# Patient Record
Sex: Female | Born: 1950 | Race: White | Hispanic: No | Marital: Married | Smoking: Never smoker
Health system: Southern US, Community
[De-identification: ages and names within clinical notes are randomized; demographics above are authoritative.]

## PROBLEM LIST (undated history)

## (undated) DIAGNOSIS — Z8719 Personal history of other diseases of the digestive system: Secondary | ICD-10-CM

## (undated) DIAGNOSIS — T8859XA Other complications of anesthesia, initial encounter: Secondary | ICD-10-CM

## (undated) DIAGNOSIS — N393 Stress incontinence (female) (male): Secondary | ICD-10-CM

## (undated) DIAGNOSIS — I519 Heart disease, unspecified: Secondary | ICD-10-CM

## (undated) DIAGNOSIS — Z8709 Personal history of other diseases of the respiratory system: Secondary | ICD-10-CM

## (undated) DIAGNOSIS — M810 Age-related osteoporosis without current pathological fracture: Secondary | ICD-10-CM

## (undated) DIAGNOSIS — K219 Gastro-esophageal reflux disease without esophagitis: Secondary | ICD-10-CM

## (undated) DIAGNOSIS — Z9889 Other specified postprocedural states: Secondary | ICD-10-CM

## (undated) DIAGNOSIS — M1990 Unspecified osteoarthritis, unspecified site: Secondary | ICD-10-CM

## (undated) DIAGNOSIS — M720 Palmar fascial fibromatosis [Dupuytren]: Secondary | ICD-10-CM

## (undated) DIAGNOSIS — R911 Solitary pulmonary nodule: Secondary | ICD-10-CM

## (undated) DIAGNOSIS — I341 Nonrheumatic mitral (valve) prolapse: Secondary | ICD-10-CM

## (undated) DIAGNOSIS — Z9289 Personal history of other medical treatment: Secondary | ICD-10-CM

## (undated) DIAGNOSIS — Z973 Presence of spectacles and contact lenses: Secondary | ICD-10-CM

## (undated) DIAGNOSIS — I499 Cardiac arrhythmia, unspecified: Secondary | ICD-10-CM

## (undated) DIAGNOSIS — E785 Hyperlipidemia, unspecified: Secondary | ICD-10-CM

## (undated) DIAGNOSIS — C801 Malignant (primary) neoplasm, unspecified: Secondary | ICD-10-CM

## (undated) DIAGNOSIS — I951 Orthostatic hypotension: Secondary | ICD-10-CM

## (undated) DIAGNOSIS — J302 Other seasonal allergic rhinitis: Secondary | ICD-10-CM

## (undated) DIAGNOSIS — R011 Cardiac murmur, unspecified: Secondary | ICD-10-CM

## (undated) DIAGNOSIS — I700 Atherosclerosis of aorta: Secondary | ICD-10-CM

## (undated) DIAGNOSIS — K759 Inflammatory liver disease, unspecified: Secondary | ICD-10-CM

## (undated) DIAGNOSIS — R079 Chest pain, unspecified: Secondary | ICD-10-CM

## (undated) DIAGNOSIS — T4145XA Adverse effect of unspecified anesthetic, initial encounter: Secondary | ICD-10-CM

## (undated) DIAGNOSIS — I471 Supraventricular tachycardia, unspecified: Secondary | ICD-10-CM

## (undated) DIAGNOSIS — Z8701 Personal history of pneumonia (recurrent): Secondary | ICD-10-CM

## (undated) DIAGNOSIS — M2560 Stiffness of unspecified joint, not elsewhere classified: Secondary | ICD-10-CM

## (undated) DIAGNOSIS — I071 Rheumatic tricuspid insufficiency: Secondary | ICD-10-CM

## (undated) DIAGNOSIS — O24419 Gestational diabetes mellitus in pregnancy, unspecified control: Secondary | ICD-10-CM

## (undated) DIAGNOSIS — E7800 Pure hypercholesterolemia, unspecified: Secondary | ICD-10-CM

## (undated) DIAGNOSIS — R112 Nausea with vomiting, unspecified: Secondary | ICD-10-CM

## (undated) DIAGNOSIS — R928 Other abnormal and inconclusive findings on diagnostic imaging of breast: Secondary | ICD-10-CM

## (undated) DIAGNOSIS — Z98811 Dental restoration status: Secondary | ICD-10-CM

## (undated) DIAGNOSIS — I340 Nonrheumatic mitral (valve) insufficiency: Secondary | ICD-10-CM

## (undated) DIAGNOSIS — Z8679 Personal history of other diseases of the circulatory system: Secondary | ICD-10-CM

## (undated) HISTORY — DX: Chest pain, unspecified: R07.9

## (undated) HISTORY — PX: TUBAL LIGATION: SHX77

## (undated) HISTORY — PX: CATARACT EXTRACTION: SUR2

## (undated) HISTORY — PX: LOOP RECORDER IMPLANT: SHX5954

## (undated) HISTORY — DX: Pure hypercholesterolemia, unspecified: E78.00

## (undated) HISTORY — PX: UPPER GI ENDOSCOPY: SHX6162

## (undated) HISTORY — DX: Age-related osteoporosis without current pathological fracture: M81.0

## (undated) HISTORY — PX: INGUINAL HERNIA REPAIR: SHX194

## (undated) HISTORY — DX: Gastro-esophageal reflux disease without esophagitis: K21.9

## (undated) HISTORY — PX: WISDOM TOOTH EXTRACTION: SHX21

---

## 1975-07-18 HISTORY — PX: TONSILLECTOMY: SHX5217

## 1980-07-17 DIAGNOSIS — Z9289 Personal history of other medical treatment: Secondary | ICD-10-CM

## 1980-07-17 HISTORY — DX: Personal history of other medical treatment: Z92.89

## 1998-01-12 ENCOUNTER — Other Ambulatory Visit: Admission: RE | Admit: 1998-01-12 | Discharge: 1998-01-12 | Payer: Self-pay | Admitting: Obstetrics and Gynecology

## 1999-02-07 ENCOUNTER — Other Ambulatory Visit: Admission: RE | Admit: 1999-02-07 | Discharge: 1999-02-07 | Payer: Self-pay | Admitting: Obstetrics and Gynecology

## 1999-10-20 ENCOUNTER — Encounter: Admission: RE | Admit: 1999-10-20 | Discharge: 1999-10-20 | Payer: Self-pay | Admitting: Neurology

## 1999-10-20 ENCOUNTER — Encounter: Payer: Self-pay | Admitting: Neurology

## 1999-12-19 ENCOUNTER — Encounter: Admission: RE | Admit: 1999-12-19 | Discharge: 2000-01-30 | Payer: Self-pay | Admitting: Anesthesiology

## 2000-02-13 ENCOUNTER — Encounter: Payer: Self-pay | Admitting: Obstetrics and Gynecology

## 2000-02-13 ENCOUNTER — Other Ambulatory Visit: Admission: RE | Admit: 2000-02-13 | Discharge: 2000-02-13 | Payer: Self-pay | Admitting: Obstetrics and Gynecology

## 2000-02-13 ENCOUNTER — Encounter: Admission: RE | Admit: 2000-02-13 | Discharge: 2000-02-13 | Payer: Self-pay | Admitting: Obstetrics and Gynecology

## 2000-05-28 ENCOUNTER — Encounter: Admission: RE | Admit: 2000-05-28 | Discharge: 2000-08-26 | Payer: Self-pay | Admitting: Anesthesiology

## 2000-07-07 ENCOUNTER — Encounter: Payer: Self-pay | Admitting: Neurosurgery

## 2000-07-07 ENCOUNTER — Ambulatory Visit (HOSPITAL_COMMUNITY): Admission: RE | Admit: 2000-07-07 | Discharge: 2000-07-07 | Payer: Self-pay | Admitting: Neurosurgery

## 2001-03-04 ENCOUNTER — Encounter: Admission: RE | Admit: 2001-03-04 | Discharge: 2001-03-04 | Payer: Self-pay | Admitting: Obstetrics and Gynecology

## 2001-03-04 ENCOUNTER — Other Ambulatory Visit: Admission: RE | Admit: 2001-03-04 | Discharge: 2001-03-04 | Payer: Self-pay | Admitting: Obstetrics and Gynecology

## 2001-03-04 ENCOUNTER — Encounter: Payer: Self-pay | Admitting: Obstetrics and Gynecology

## 2001-07-16 ENCOUNTER — Encounter: Admission: RE | Admit: 2001-07-16 | Discharge: 2001-07-16 | Payer: Self-pay | Admitting: Cardiology

## 2001-07-16 ENCOUNTER — Encounter: Payer: Self-pay | Admitting: Cardiology

## 2001-10-07 ENCOUNTER — Ambulatory Visit (HOSPITAL_COMMUNITY): Admission: RE | Admit: 2001-10-07 | Discharge: 2001-10-07 | Payer: Self-pay | Admitting: *Deleted

## 2002-01-31 ENCOUNTER — Encounter: Payer: Self-pay | Admitting: Neurosurgery

## 2002-02-03 ENCOUNTER — Observation Stay (HOSPITAL_COMMUNITY): Admission: RE | Admit: 2002-02-03 | Discharge: 2002-02-04 | Payer: Self-pay | Admitting: Neurosurgery

## 2002-02-03 ENCOUNTER — Encounter: Payer: Self-pay | Admitting: Neurosurgery

## 2002-02-03 HISTORY — PX: ANTERIOR CERVICAL DECOMP/DISCECTOMY FUSION: SHX1161

## 2002-04-28 ENCOUNTER — Encounter: Payer: Self-pay | Admitting: Obstetrics and Gynecology

## 2002-04-28 ENCOUNTER — Encounter: Admission: RE | Admit: 2002-04-28 | Discharge: 2002-04-28 | Payer: Self-pay | Admitting: Obstetrics and Gynecology

## 2003-04-17 LAB — HM COLONOSCOPY

## 2003-05-26 ENCOUNTER — Ambulatory Visit (HOSPITAL_COMMUNITY): Admission: RE | Admit: 2003-05-26 | Discharge: 2003-05-26 | Payer: Self-pay | Admitting: *Deleted

## 2003-07-07 HISTORY — PX: LAPAROSCOPIC NISSEN FUNDOPLICATION: SHX1932

## 2003-07-09 ENCOUNTER — Inpatient Hospital Stay (HOSPITAL_COMMUNITY): Admission: RE | Admit: 2003-07-09 | Discharge: 2003-07-09 | Payer: Self-pay | Admitting: Surgery

## 2003-12-15 ENCOUNTER — Encounter: Admission: RE | Admit: 2003-12-15 | Discharge: 2003-12-15 | Payer: Self-pay | Admitting: Family Medicine

## 2003-12-15 ENCOUNTER — Other Ambulatory Visit: Admission: RE | Admit: 2003-12-15 | Discharge: 2003-12-15 | Payer: Self-pay | Admitting: Family Medicine

## 2004-08-17 ENCOUNTER — Ambulatory Visit (HOSPITAL_COMMUNITY): Admission: RE | Admit: 2004-08-17 | Discharge: 2004-08-17 | Payer: Self-pay | Admitting: *Deleted

## 2005-01-30 ENCOUNTER — Encounter: Admission: RE | Admit: 2005-01-30 | Discharge: 2005-01-30 | Payer: Self-pay | Admitting: Family Medicine

## 2005-02-02 ENCOUNTER — Encounter: Admission: RE | Admit: 2005-02-02 | Discharge: 2005-02-02 | Payer: Self-pay | Admitting: Surgery

## 2005-07-07 ENCOUNTER — Other Ambulatory Visit: Admission: RE | Admit: 2005-07-07 | Discharge: 2005-07-07 | Payer: Self-pay | Admitting: Family Medicine

## 2005-08-15 ENCOUNTER — Encounter: Admission: RE | Admit: 2005-08-15 | Discharge: 2005-08-15 | Payer: Self-pay | Admitting: Family Medicine

## 2006-02-13 ENCOUNTER — Encounter: Admission: RE | Admit: 2006-02-13 | Discharge: 2006-02-13 | Payer: Self-pay | Admitting: Family Medicine

## 2006-02-20 ENCOUNTER — Encounter: Admission: RE | Admit: 2006-02-20 | Discharge: 2006-02-20 | Payer: Self-pay | Admitting: Family Medicine

## 2006-10-10 ENCOUNTER — Encounter: Admission: RE | Admit: 2006-10-10 | Discharge: 2006-10-10 | Payer: Self-pay | Admitting: Obstetrics and Gynecology

## 2007-02-18 ENCOUNTER — Encounter: Admission: RE | Admit: 2007-02-18 | Discharge: 2007-02-18 | Payer: Self-pay | Admitting: Family Medicine

## 2007-08-14 ENCOUNTER — Inpatient Hospital Stay (HOSPITAL_COMMUNITY): Admission: RE | Admit: 2007-08-14 | Discharge: 2007-08-16 | Payer: Self-pay | Admitting: Surgery

## 2007-08-14 HISTORY — PX: NISSEN FUNDOPLICATION: SHX2091

## 2009-02-10 IMAGING — RF DG ESOPHAGUS
7 series · 7 of 7 positions shown · non-contrast
Comparison: none

CLINICAL DATA: Hisanaga Bando fundoplasty (redo).
 BARIUM SWALLOW:
TECHNIQUE: With the patient semierect, nonionic water-soluble contrast was swallowed.

[Series 1: run · 1 of 1 slices shown (1 of 7)]
[im 1/1]
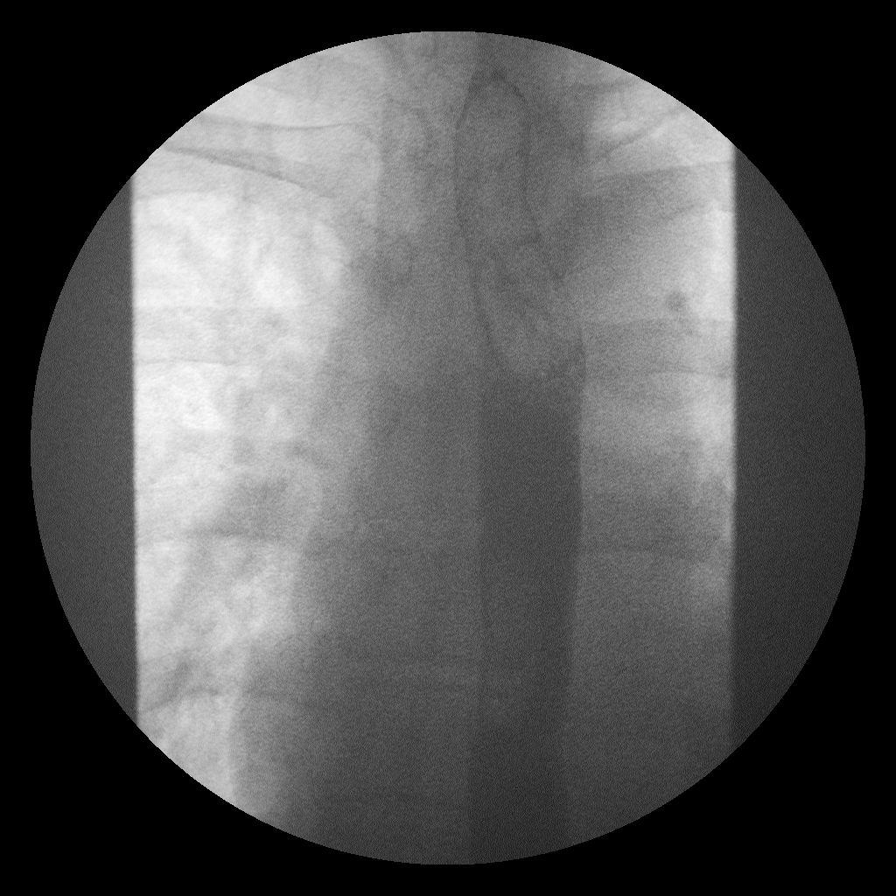

[Series 2: run · 1 of 1 slices shown (2 of 7)]
[im 1/1]
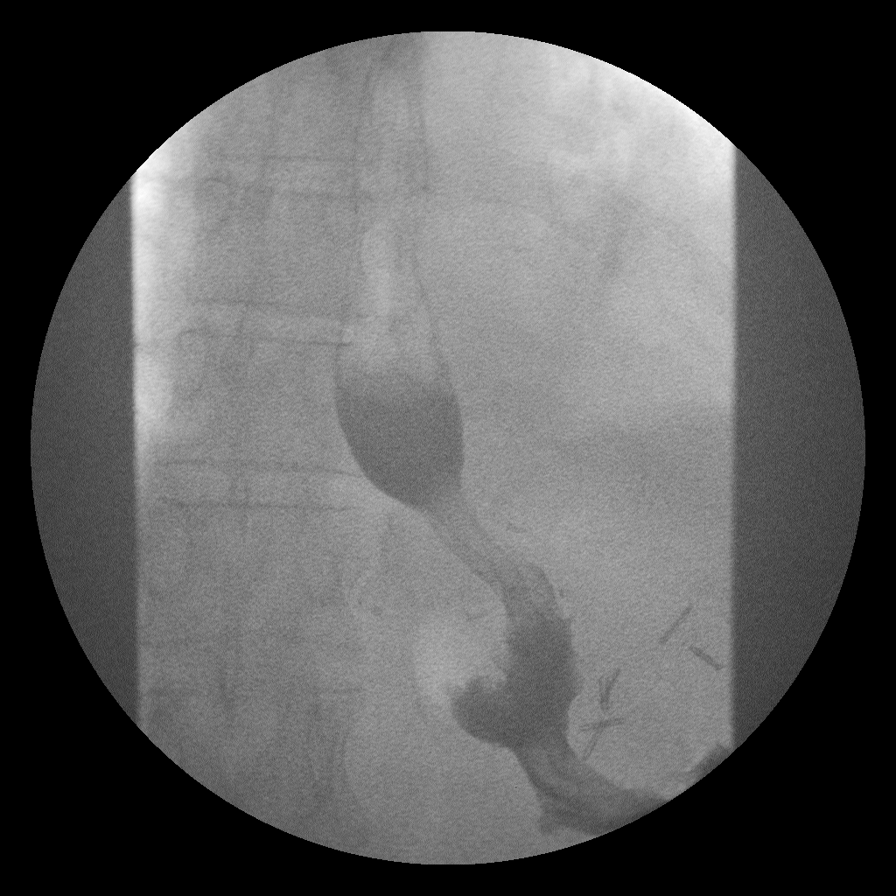

[Series 3: run · 1 of 1 slices shown (3 of 7)]
[im 1/1]
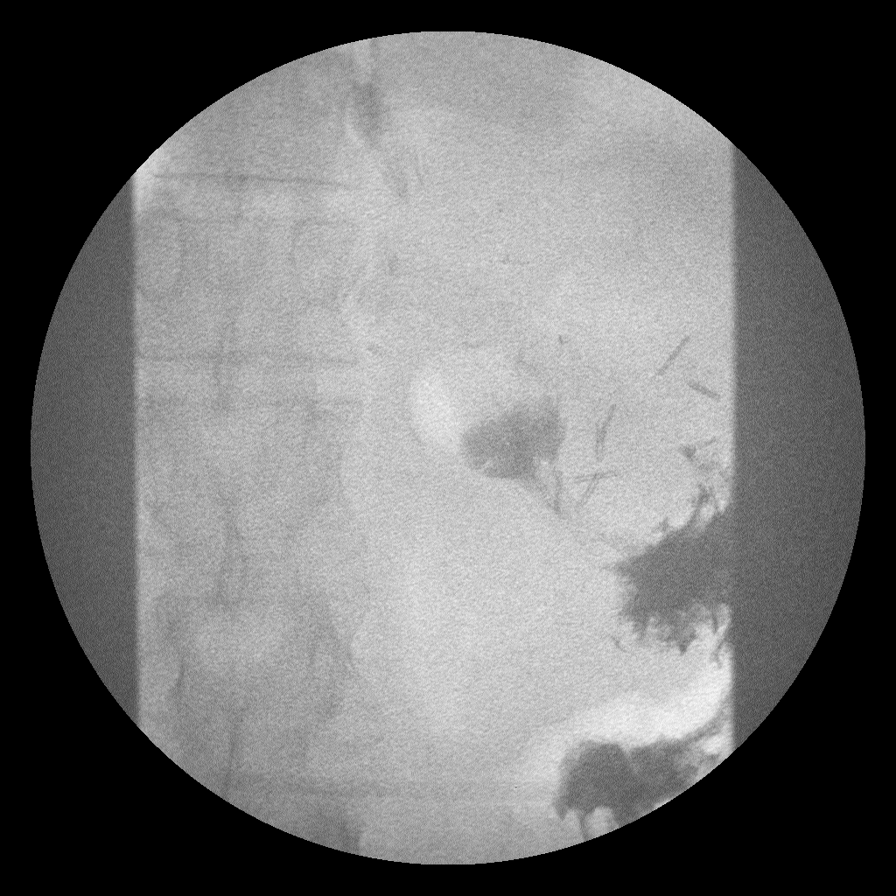

[Series 4: run · 1 of 1 slices shown (4 of 7)]
[im 1/1]
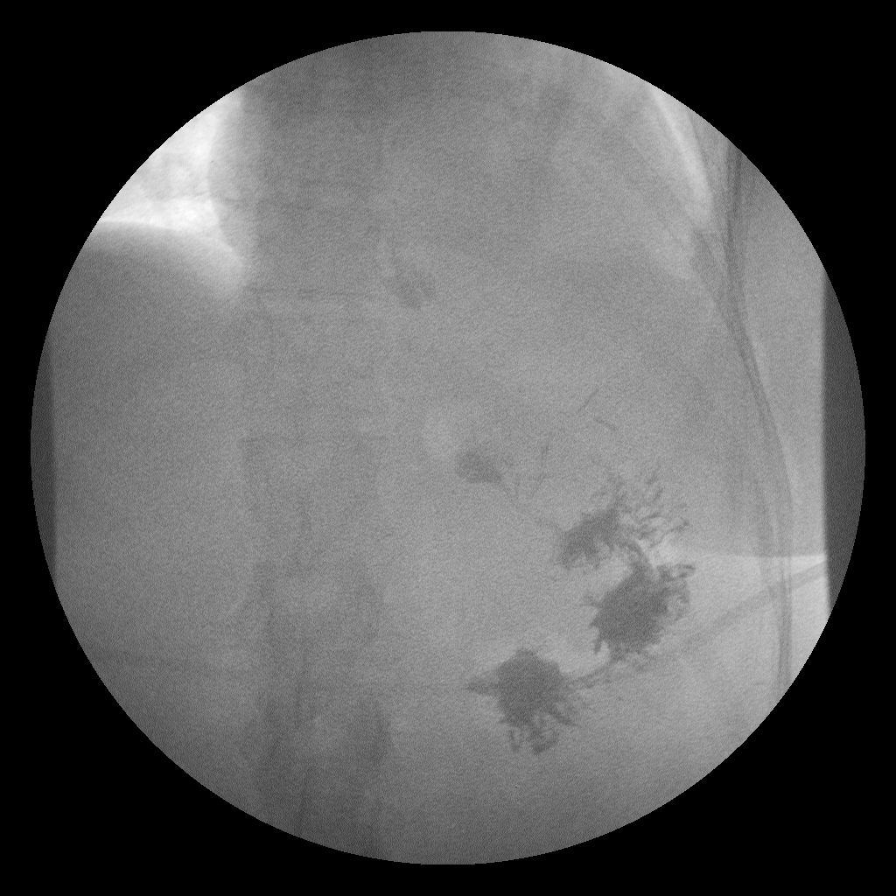

[Series 5: run · 1 of 1 slices shown (5 of 7)]
[im 1/1]
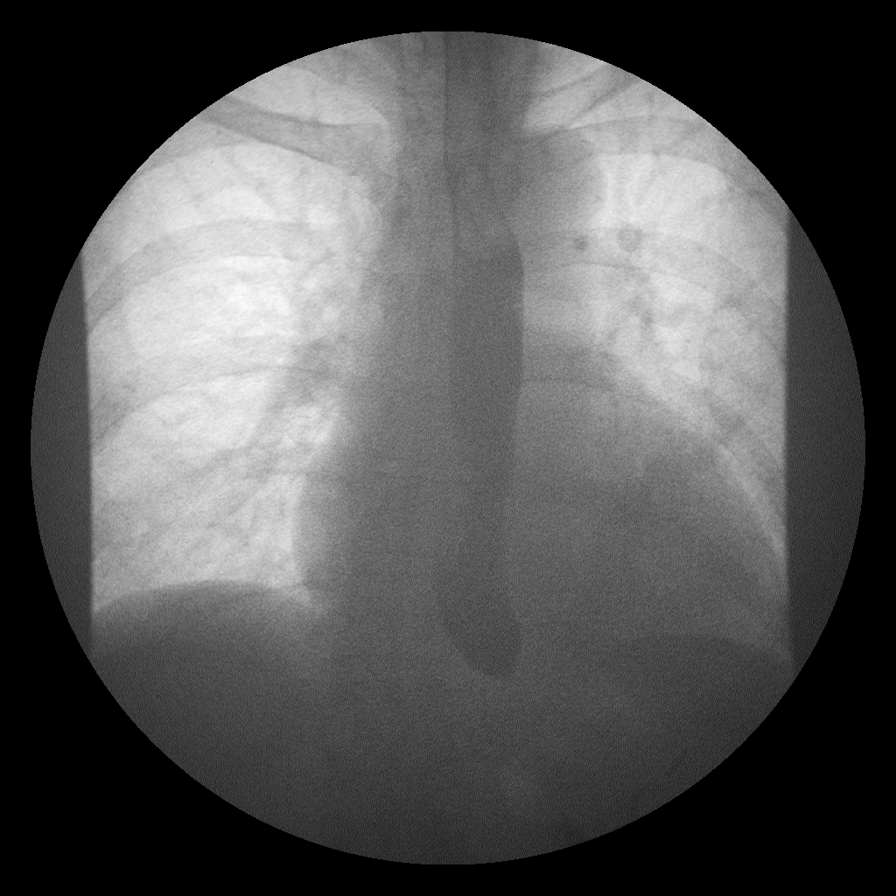

[Series 6: run · 1 of 1 slices shown (6 of 7)]
[im 1/1]
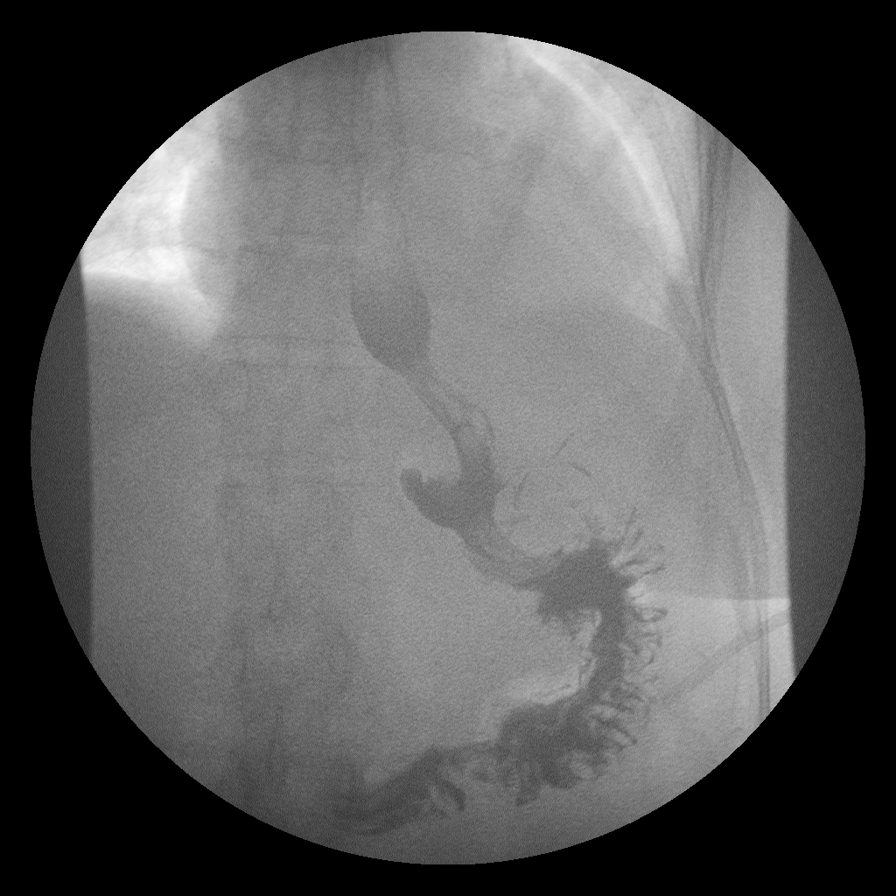

[Series 7: run · 1 of 1 slices shown (7 of 7)]
[im 1/1]
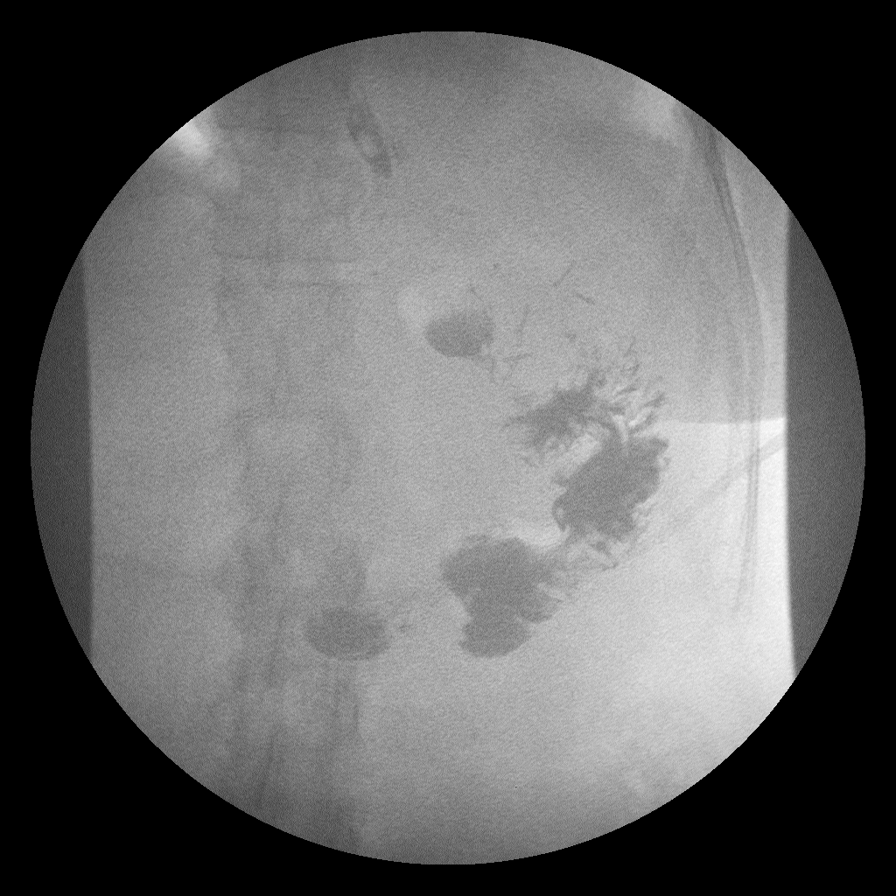

[7 of 7 positions shown; findings below may reference images not displayed]

FINDINGS: The contrast traverses the GE junction readily. There is no extravasation. There is mild circumferential narrowing consistent with a fundoplication.
IMPRESSION: Oleg Milaya fundoplasty ? no obstruction or extravasation.

## 2010-07-25 ENCOUNTER — Ambulatory Visit: Payer: Self-pay | Admitting: Cardiology

## 2010-09-27 ENCOUNTER — Other Ambulatory Visit: Payer: Self-pay

## 2010-10-04 ENCOUNTER — Other Ambulatory Visit (HOSPITAL_COMMUNITY): Payer: Self-pay | Admitting: Obstetrics and Gynecology

## 2010-10-04 DIAGNOSIS — M8580 Other specified disorders of bone density and structure, unspecified site: Secondary | ICD-10-CM

## 2010-10-13 ENCOUNTER — Encounter: Payer: Self-pay | Admitting: Cardiology

## 2010-10-13 ENCOUNTER — Ambulatory Visit (INDEPENDENT_AMBULATORY_CARE_PROVIDER_SITE_OTHER): Payer: BC Managed Care – PPO | Admitting: Cardiology

## 2010-10-13 ENCOUNTER — Other Ambulatory Visit (INDEPENDENT_AMBULATORY_CARE_PROVIDER_SITE_OTHER): Payer: BC Managed Care – PPO | Admitting: *Deleted

## 2010-10-13 VITALS — BP 110/80 | HR 60 | Wt 141.0 lb

## 2010-10-13 DIAGNOSIS — Z79899 Other long term (current) drug therapy: Secondary | ICD-10-CM

## 2010-10-13 DIAGNOSIS — R002 Palpitations: Secondary | ICD-10-CM

## 2010-10-13 DIAGNOSIS — E7800 Pure hypercholesterolemia, unspecified: Secondary | ICD-10-CM

## 2010-10-13 DIAGNOSIS — K219 Gastro-esophageal reflux disease without esophagitis: Secondary | ICD-10-CM | POA: Insufficient documentation

## 2010-10-13 DIAGNOSIS — M79603 Pain in arm, unspecified: Secondary | ICD-10-CM

## 2010-10-13 DIAGNOSIS — M79602 Pain in left arm: Secondary | ICD-10-CM

## 2010-10-13 DIAGNOSIS — M79609 Pain in unspecified limb: Secondary | ICD-10-CM

## 2010-10-13 HISTORY — DX: Palpitations: R00.2

## 2010-10-13 LAB — HEPATIC FUNCTION PANEL
ALT: 20 U/L (ref 0–35)
AST: 27 U/L (ref 0–37)
Bilirubin, Direct: 0.1 mg/dL (ref 0.0–0.3)
Total Bilirubin: 0.6 mg/dL (ref 0.3–1.2)
Total Protein: 6.9 g/dL (ref 6.0–8.3)

## 2010-10-13 LAB — LIPID PANEL
Cholesterol: 151 mg/dL (ref 0–200)
Triglycerides: 144 mg/dL (ref 0.0–149.0)

## 2010-10-13 LAB — BASIC METABOLIC PANEL
Calcium: 9 mg/dL (ref 8.4–10.5)
Potassium: 4.2 mEq/L (ref 3.5–5.1)
Sodium: 142 mEq/L (ref 135–145)

## 2010-10-13 NOTE — Assessment & Plan Note (Signed)
Her gastrointestinal symptoms have remained stable.  Continue current therapy

## 2010-10-13 NOTE — Assessment & Plan Note (Signed)
We did an electrocardiogram because of her left arm pain.  He shows no evidence of ischemia and is within normal limits.

## 2010-10-13 NOTE — Assessment & Plan Note (Signed)
The patient has been having some palpitations related to the stress that she is under.  She was reassured by today's normal EKG.  We will plan to see her again in about 4 months for followup office visit and lab work.

## 2010-10-13 NOTE — Assessment & Plan Note (Signed)
The patient is tolerating Lipitor without side effects.  We will await results of today's lab work.

## 2010-10-13 NOTE — Progress Notes (Signed)
HPI: This pleasant 60 year old woman is seen as a work in office visit.  She had come in to get lab work as a followup to having been placed on Lipitor.  She complained to the laboratory staff that she was experiencing some left arm discomfort and that also from time to time she would have discomfort in her right jaw.  She has been under a lot of stress because both of her parents have dementia and are in a nursing home in [facility].  Also the patient recently had to place her mother-in-law in a nursing home in [place].  Current Outpatient Prescriptions  Medication Sig Dispense Refill  . acetaminophen (TYLENOL) 325 MG tablet Take 650 mg by mouth as needed.        [name] aspirin 81 MG tablet Take 81 mg by mouth daily.        [name] atorvastatin (LIPITOR) 20 MG tablet Take 20 mg by mouth daily.        . Coenzyme Q10 (COQ-10 PO) Take by mouth.        [name] FLUoxetine HCl (PROZAC PO) Take by mouth.        . meclizine (ANTIVERT) 25 MG tablet Take 25 mg by mouth as needed.        . metoprolol (LOPRESSOR) 50 MG tablet Take 50 mg by mouth 2 (two) times daily.        . multivitamin (THERAGRAN) per tablet Take 1 tablet by mouth daily.        . raloxifene (EVISTA) 60 MG tablet Take 60 mg by mouth daily.        . meloxicam (MOBIC) 15 MG tablet Take 15 mg by mouth daily.          Allergies  Allergen Reactions  . Simvastatin     Leg pain    Patient Active Problem List  Diagnoses  . GERD (gastroesophageal reflux disease)  . Hypercholesterolemia  . Arm pain, left  . Heart palpitations    History  Smoking status  . Never Smoker   Smokeless tobacco  . Not on file    History  Alcohol Use: Not on file    Family History  Problem Relation Age of Onset  . Alzheimer's disease Mother   . Alzheimer's disease Father     Review of Systems: The patient denies any heat or cold intolerance.  No weight gain or weight loss.  The patient denies headaches or blurry vision.  There is no cough or  sputum production.  The patient denies dizziness.  There is no hematuria or hematochezia.  The patient denies any muscle aches or arthritis.  The patient denies any rash.  The patient denies frequent falling or instability.  There is no history of depression or anxiety.  All other systems were reviewed and are negative.   Physical Exam: Filed Vitals:   10/13/10 1038  BP: 110/80  Pulse: 60  The general appearance feels a anxious middle-aged woman in no distress.Pupils equal and reactive.   Extraocular Movements are full.  There is no scleral icterus.  The mouth and pharynx are normal.  The neck is supple.  The carotids reveal no bruits.  The jugular venous pressure is normal.  The thyroid is not enlarged.  There is no lymphadenopathy.The chest is clear to percussion and auscultation. There are no rales or rhonchi. Expansion of the chest is symmetrical.The precordium is quiet.  The first heart sound is normal.  The second heart sound is physiologically split.  There is no murmur gallop rub or click.  There is no abnormal lift or heave.The abdomen is soft and nontender. Bowel sounds are normal. The liver and spleen are not enlarged. There Are no abdominal masses. There are no bruits.  Normal extremity without phlebitis or edema.  Her left arm is not tender to touch.  There is no limitation of motion.  Passive motion of the left arm does not elicit any discomfort.Strength is normal and symmetrical in all extremities.  There is no lateralizing weakness.  There are no sensory deficits.    Assessment / Plan:

## 2010-10-14 ENCOUNTER — Ambulatory Visit (HOSPITAL_COMMUNITY)
Admission: RE | Admit: 2010-10-14 | Discharge: 2010-10-14 | Disposition: A | Discharge status: Home / Self Care | Payer: BC Managed Care – PPO | Source: Ambulatory Visit | Attending: Obstetrics and Gynecology | Admitting: Obstetrics and Gynecology

## 2010-10-14 DIAGNOSIS — Z780 Asymptomatic menopausal state: Secondary | ICD-10-CM | POA: Insufficient documentation

## 2010-10-14 DIAGNOSIS — M8580 Other specified disorders of bone density and structure, unspecified site: Secondary | ICD-10-CM

## 2010-10-14 DIAGNOSIS — Z13820 Encounter for screening for osteoporosis: Secondary | ICD-10-CM | POA: Insufficient documentation

## 2010-10-19 ENCOUNTER — Telehealth: Payer: Self-pay | Admitting: *Deleted

## 2010-10-19 NOTE — Telephone Encounter (Signed)
Message copied by Regis Bill on Wed Oct 19, 2010 10:16 AM ------      Message from: Cassell Clement      Created: Thu Oct 13, 2010  5:06 PM       Chemistries are normal.  Liver function studies normal.  Kidney function normal.  Lipid panel is normal.  Continue the same medication.

## 2010-10-19 NOTE — Telephone Encounter (Signed)
Advised patient of labs 

## 2010-11-03 ENCOUNTER — Other Ambulatory Visit: Payer: Self-pay | Admitting: *Deleted

## 2010-11-03 DIAGNOSIS — E7800 Pure hypercholesterolemia, unspecified: Secondary | ICD-10-CM

## 2010-11-03 MED ORDER — ATORVASTATIN CALCIUM 20 MG PO TABS: 20.0000 mg | ORAL_TABLET | Freq: Every day | ORAL | Status: DC

## 2010-11-03 NOTE — Telephone Encounter (Signed)
Refilled atorvastatin by fax to [facility]

## 2010-11-21 ENCOUNTER — Other Ambulatory Visit: Payer: Self-pay | Admitting: Gastroenterology

## 2010-11-21 DIAGNOSIS — R1011 Right upper quadrant pain: Secondary | ICD-10-CM

## 2010-11-22 ENCOUNTER — Ambulatory Visit
Admission: RE | Admit: 2010-11-22 | Discharge: 2010-11-22 | Disposition: A | Discharge status: Home / Self Care | Payer: BC Managed Care – PPO | Source: Ambulatory Visit | Attending: Gastroenterology | Admitting: Gastroenterology

## 2010-11-22 DIAGNOSIS — R1011 Right upper quadrant pain: Secondary | ICD-10-CM

## 2010-11-29 NOTE — Op Note (Signed)
Tina [name], Tina [name]                ACCOUNT NO.:  [id number]   MEDICAL RECORD NO.:  [id number]          PATIENT TYPE:  INP   LOCATION:  1317                         FACILITY:  [facility]   PHYSICIAN:  Sharlet Salina T. Hoxworth, M.D.DATE OF BIRTH:  21-Dec-1950   DATE OF PROCEDURE:  08/15/2007  DATE OF DISCHARGE:                               OPERATIVE REPORT   PROCEDURE:  Upper GI endoscopy.   DESCRIPTION OF PROCEDURE:  Upper GI endoscopy is performed  intraoperatively at the request of Dr. Daphine Deutscher during laparoscopic repair  of a recurrent hiatal hernia and Nissen fundoplication to assess anatomy  and rule out injury or leak.  The Olympus video endoscope was inserted  into the upper esophagus and then passed under direct vision to the EG  junction at 40 cm.  The stomach was entered.  The stomach was distended  with air and there was no evidence of leak.  The gastric mucosa appeared  normal.  Retroflexed view showed the scope coming through the EG  junction with some slight twisting of the mucosa at this area.  The  scope was then withdrawn.  The EG junction appeared normal without  erosions or inflammation and appeared to be below the hiatus at this  point.  The esophagus appeared normal.  The stomach was desufflated and  the scope withdrawn.  The patient tolerated the procedure well.      Lorne Skeens. Hoxworth, M.D.  Electronically Signed     BTH/MEDQ  D:  08/15/2007  T:  08/15/2007  Job:  [id number]

## 2010-11-29 NOTE — Op Note (Signed)
Tina [name], Tina [name]                ACCOUNT NO.:  [id number]   MEDICAL RECORD NO.:  [id number]          PATIENT TYPE:  INP   LOCATION:  0005                         FACILITY:  [facility]   PHYSICIAN:  Thornton Park. Daphine Deutscher, MD  DATE OF BIRTH:  19-Oct-1950   DATE OF PROCEDURE:  08/14/2007  DATE OF DISCHARGE:                               OPERATIVE REPORT   PREOPERATIVE DIAGNOSIS:  Slipped Nissen fundoplication from a procedure  done in 2004.   POSTOPERATIVE DIAGNOSIS:  Slipped Nissen fundoplication from a procedure  done in 2004 with hiatal hernia, recurrent.   PROCEDURE:  Laparoscopic takedown of the old Nissen fundoplication,  repair of hiatus with two pledgeted sutures posteriorly and onlay of  human MTF allograft, redo Nissen fundoplication over for a #56 lighted  bougie.   SURGEON:  Thornton Park. Daphine Deutscher, M.D.   ASSISTANT:  Sharlet Salina T. Hoxworth, M.D.   PROCEDURE IN DETAIL:  This is a 60 year old white female who underwent a  lap Nissen by Dr. Jamey Ripa in 2004.  She had the onset of recurrent  symptoms and was studied and found to have herniation of her wrap above  her diaphragm.  Informed consent was obtained regarding laparoscopic as  well as open hiatal hernia repair.   The patient was taken to room 11 at [facility] on January 28 and given  general anesthesia.  The abdomen was prepped with TechniCare and draped  sterilely.  I entered the abdomen through the left upper quadrant with a  10/11 OptiVu 0 degrees scope without difficulty and insufflated the  abdomen.  Standard trocar placements were used including the Nathanson  retractor in the upper abdomen and two 11 on the right and one slightly  to the left of midline for the camera and then a 5 laterally on the  left.   Fortunately, the adhesions to the liver were fairly easy to take down  and I was able to see where the recurrent hernia just was herniating  above the diaphragm.  These were taken down.  I was able to mobilize the  esophagus and gave a good length.  I then was able to take down the wrap  completely and get it to pull back around over on the left side.  Following this, I repaired the diaphragm with two sutures using  pledgeted sutures in the Endostitch.  The second one I placed, the  suture broke and I was unable to retrieve the pledget, but I pledgeted  the third suture which was securely tied snugging up the diaphragm  nicely.  When complete, Dr. Johna Sheriff endoscoped the patient to show the  GE junction and we retroflexed and looked at the EG junction from below  and found that there was no hiatal hernia and there was no evidence of  any injury to the esophagogastric region.   Following the posterior repair, I then cut a piece of human MTF skin  graft and I placed it on the diaphragm anteriorly, tacking it at the 12  o'clock position and then on either side of the diaphragm, and also  brought it around  to tack it to itself posteriorly so it actually went  all the way around the esophagogastric junction.  Following this, I then  reached around behind the esophagus, grabbed the cardia which was freed.  I did take down some short gastrics and got a little bleeding, I put a  clip on one of those over on the left side, following those maneuvers  there was no further bleeding.  I brought it around and was able to,  with the 56 lighted bougie in place passed by Dr. Okey Dupre, able to  invaginate the distal esophagus and placed three sutures, the top most  one with a tie knot, and a free needle and then the lower two with the  Endostitch.  I also squirted Tisseel on the EG junction where the wrap  was and up around the  MTF graft.  The patient seemed to tolerate the procedure well.  No  bleeding or any other problems were noted.  The abdomen was deflated.  The wounds were closed with 4-0 Vicryl, Benzoin, and Steri-Strips.  The  patient tolerated the procedure well and was taken to the recovery room  in  satisfactory addition.      Thornton Park Daphine Deutscher, MD  Electronically Signed     MBM/MEDQ  D:  08/14/2007  T:  08/14/2007  Job:  [id number]   cc:   Gretta Arab. Valentina Lucks, M.D.  Fax: [phone]   Cassell Clement, M.D.  Fax: [phone]   Shirley Friar, MD  Fax: [phone]

## 2010-11-29 NOTE — Discharge Summary (Signed)
[name], [name]                ACCOUNT NO.:  [id number]   MEDICAL RECORD NO.:  [id number]          PATIENT TYPE:  INP   LOCATION:  1317                         FACILITY:  [facility]   PHYSICIAN:  Thornton Park. Daphine Deutscher, MD  DATE OF BIRTH:  1950-08-23   DATE OF ADMISSION:  08/14/2007  DATE OF DISCHARGE:  08/16/2007                               DISCHARGE SUMMARY   ADMITTING DIAGNOSIS:  Failed Nissen fundoplication.   PROCEDURE:  Laparoscopic takedown of previous Nissen fundoplication and  repair of the diaphragm with TM and MTF allograft and redo Nissen  fundoplication over 56-lighted bougie.   COURSE IN THE HOSPITAL:  [name] underwent the above-mentioned  procedure.  She had a swallow on postop day 1 which showed no evidence  of leak and good flow.  Lab was checked and her CBC was fine.  Her  incision looked good and she was ready for discharge on postop day 2 to  take Tylenol Elixir for pain.  I gave her a prescription for Phenergan  suppositories #12 to take 1 per rectum every 6 hours p.r.n. nausea.  She  is to come back to see me in the office within 3 weeks.   FINAL DIAGNOSIS:  Status post repair of Nissen fundoplication and  recurrent hiatal hernia.      Thornton Park Daphine Deutscher, MD  Electronically Signed     MBM/MEDQ  D:  08/16/2007  T:  08/16/2007  Job:  [id number]

## 2010-12-02 NOTE — Procedures (Signed)
[facility]  Patient:    Tina [name], Tina [name]                       MRN: [id number] Proc. Date: 12/19/99 Adm. Date:  [id number] Attending:  Thyra Breed CC:         Marlan Palau, M.D.                           Procedure Report  ADDENDUM:  Ettie noted near-total resolution of her pain after the SI joint injection.  I advised that I bring her back in about four to six weeks and repeat it, then spread them out to every four months to see whether we could keep good control with this.  She is elated that she responded so positively. DD:  12/19/99 TD:  12/21/99 Job: [id number]

## 2010-12-02 NOTE — Op Note (Signed)
NAME:  Tina [name], Tina [name]                          ACCOUNT NO.:  [id number]   MEDICAL RECORD NO.:  [id number]                   PATIENT TYPE:  AMB   LOCATION:  DAY                                  FACILITY:  [facility]   PHYSICIAN:  Currie Paris, M.D.           DATE OF BIRTH:  [birth year]   DATE OF PROCEDURE:  07/07/2003  DATE OF DISCHARGE:                                 OPERATIVE REPORT   OFFICE RECORD NUMBER:  [id number]   PREOPERATIVE DIAGNOSIS:  Reflux esophagitis with small  hiatal hernia.   POSTOPERATIVE DIAGNOSIS:  Reflux esophagitis with small  hiatal hernia.   OPERATION:  Laparoscopic Nissen with closure of esophageal  hiatus.   SURGEON:  Currie Paris, M.D.   ASSISTANT:  Ollen Gross. Carolynne Edouard, M.D.   ANESTHESIA:  General.   INDICATIONS FOR PROCEDURE:  This patient is a 60 year old with progressive  problems with reflux symptoms, not well controlled and with developing upper  respiratory symptoms. She elected to proceed to a laparoscopic Nissen.   DESCRIPTION OF PROCEDURE:  The patient was seen the holding area and had no  further questions. She was taken to the operating room and after  satisfactory general endotracheal anesthesia had been induced a Foley  catheter was placed and the abdomen was prepped and draped. Then 0.5%  Marcaine with epinephrine was used for each incision.   The supraumbical  incision was made first. The fascia was opened. The  peritoneal cavity was entered under direct vision. A pursestring was placed.  The Hasson was introduced and the abdomen  insufflated to 15. The patient  was placed in the reverse Trendelenburg position. A 5-mm trocar was placed  in the high epigastrium and a flexible liver retractor was placed and the  left lobe of the liver was retracted  up off the esophageal  hiatus. A 10-11  trocar was placed under direct vision in the left upper quadrant, 2 in the  right upper quadrant.   The anatomy looked fairly clear with the  esophageal  hiatus noticed readily  apparent. I opened the peritoneum just over the right crus and divided that  with a harmonic and then came across the anterior esophagus to the very top  of the left crus. I then made a window behind the diaphragm and identified  the left crus on the opposite side, and once we had a fairly nice window, I  could that the hiatus would require what looked like between 1 and 2 sutures  to close appropriately.   At this point attention was turned to the greater curve and was grasped in  the stomach  and the greater curve omentum. A window was made and the short  gastrics were divided. I worked my way up, dividing them off of the spleen  so that we had complete mobility of the gastric fundus. Once all this was  done, we checked to make sure that  it was dry. I was able to reach a Babcock  under the esophagus, grab the fundus from the left side, pull it under, and  it had nice easy pullthrough with no tension and good shoe shine sign so  that we had no tension and laid over the esophagus  nicely.   At this point a 50 lighted dilator was passed readily by anesthesia. I  placed a single suture  to close the hiatus to tie that down, giving  appropriate tightness  to the hiatus without overly constricting it. The  wrap was then completed using 3 sutures placed directly with suture going  from the fundus to the esophagus to fundus, completing the wrap, and again  with no tension.   The dilator was removed and this came down nicely and appeared  to be lying  in the appropriate area with good wrap of the esophagus. A check was made  for hemostasis and everything appeared  to be dry. The trocar was removed  under direct vision. The most lateral  right upper  quadrant trocar site was  noticed to be a little large on the fascia, so an endoclose suture was used  in place of 2-0 Vicryl to reapproximate that. The remaining appeared  to  close nicely. The midline  was  closed with the pursestring. The skin was  closed with 4-0 Monocryl subcuticular and  Dermabond.   The patient tolerated the procedure well. There were no intraoperative  complications. All counts were correct.                                               Currie Paris, M.D.    CJS/MEDQ  D:  07/07/2003  T:  07/07/2003  Job:  [id number]   cc:   Cassell Clement, M.D.  [address]  [place]  [place] [address]  Fax: [phone]   Althea Grimmer. Luther Parody, M.D.  [address], [address]  [place]  [place] [address]  Fax: [phone]

## 2010-12-02 NOTE — Op Note (Signed)
NAME:  [name], Tina [name]                          ACCOUNT NO.:  [id number]   MEDICAL RECORD NO.:  [id number]                   PATIENT TYPE:  AMB   LOCATION:  ENDO                                 FACILITY:  [facility]   PHYSICIAN:  Althea Grimmer. Luther Parody, M.D.            DATE OF BIRTH:  03-Dec-1950   DATE OF PROCEDURE:  05/26/2003  DATE OF DISCHARGE:  05/26/2003                                 OPERATIVE REPORT   PROCEDURE:  Esophageal  manometry.   INDICATIONS FOR PROCEDURE:  Heart  burn, regurgitation and cough  in a 60-  year-old female.   DESCRIPTION OF PROCEDURE:  The usual manometry protocol was used with  catheter passed by the nares. The lower esophageal  sphincter pressure was  low at 2.6 mmHg with the lower esophageal sphincter length only being 2 cm.  There was 80% relaxation with swallows. Peristaltic swallows were  demonstrated throughout the esophagus, however, only 70% of contractions  were truly peristaltic through the entire esophagus. The amplitudes of mid  and distal esophagus  were within normal limits ranging from 34 to 68 mmHg.   IMPRESSION:  Near normal esophageal  manometry with peristaltic contractions  and appropriate relaxation of the lower esophageal sphincter which is quite  low in pressure.                                               Althea Grimmer. Luther Parody, M.D.    PJS/MEDQ  D:  06/08/2003  T:  06/08/2003  Job:  [id number]   cc:   Currie Paris, M.D.  [address]  [place]  [place] [address]  Fax: [phone]

## 2010-12-02 NOTE — Procedures (Signed)
[facility]  Patient:    Tina [name], Tina [name]                       MRN: [id number] Proc. Date: 01/20/00 Adm. Date:  [id number] Attending:  Thyra Breed CC:         Marlan Palau, M.D.                           Procedure Report  PROCEDURE:  Left SI joint injection.  DIAGNOSIS:  Left buttock pain and SI joint pain.  INTERVAL HISTORY:  The patient has noted that her myoclonus is markedly improved after the first injection. She has very mild pain which she rated at about 3-4/10. She feels better overall.  PHYSICAL EXAMINATION:  VITAL SIGNS:  Blood pressure 118/80, heart rate 81, respiratory rate 19, O2 saturations 97%.  NEUROLOGIC:  Grossly unchanged.  DESCRIPTION OF PROCEDURE:  After informed consent was obtained, the patient was placed in the fluoroscopy suite on the fluoro table in the prone position with a pillow under her abdomen. Monitors were placed. Using fluoroscopic guidance, we identified the SI joint and optimized visualization by adjusting the beam. The skin was marked overlying the joint. The area was prepped with Betadine x 3 and draped. A 25 gauge needle was used to anesthetize the skin and subcutaneous structures. A 25 gauge spinal needle was introduced down into the SI joint confirmed by AP, lateral and oblique projections. Aspiration was negative. I injected 40 mg of Medrol and 1 cc of 1% lidocaine. The needle was flushed with 1% lidocaine and removed intact.  CONDITION POST PROCEDURE:  Stable.  DISCHARGE INSTRUCTIONS: 1. Resume previous diet. 2. Limitations in activities per instruction sheet. 3. Continue on current medications. 4. The patient plans to follow-up with Dr. Anne Hahn. DD:  01/20/00 TD:  01/20/00 Job: [id number]

## 2010-12-02 NOTE — Procedures (Signed)
[facility]  Patient:    Tina [name], Tina [name]                         MRN: [id number] Proc. Date: 05/28/00 Attending:  Thyra Breed, M.D. CC:         Marlan Palau, M.D.   Procedure Report  PROCEDURE:  Left SI joint injection.  DIAGNOSIS:  Left buttock and SI joint discomfort.  INTERVAL HISTORY:  The patient states that she was doing fairly well up until about a month ago. She had her last SI joint injection back in July. One month ago, her pain recurred spontaneously. She saw Dr. Anne Hahn who placed her on a dosepak which helped to reduce her pain but caused a lot of side effects. She currently presents with a pain of about 3/10. Associated with her discomfort she has had recurrent cramping over the lateral aspect of the left calf but no numbness or tingling, weakness or bowel or bladder incontinence. She has also had a history of a left Baker cyst.  PHYSICAL EXAMINATION:  Blood pressure 136/76, heart rate 83, respiratory rate 15, O2 saturations 100% and pain level is 3/10. Straight leg raise signs are negative. She is tender over her left SI joint. Deep tendon reflexes are symmetric at the knees and ankles at 2+. Motor is 5/5. Sensory is grossly intact.  After informed consent and discussion of alternatives which was a detailed discussion, I advised the patient that we would try another SI joint injection. I advised her that she does have some symptoms suggestive of an L5 radiculopathy and it may be that her problem is more in the lumbar area. We reviewed the concept of sclerotomes.  DESCRIPTION OF PROCEDURE:  After informed consent, the patient was taken to the fluoroscopy suite where she was placed in the prone position with a pillow under her abdomen. I identified the left SI joint using the fluoroscope and optimized visualization by adjusting the beam. A mark was made in the skin overlying the joint. The area was prepped with Betadine x 3 and  draped. Using a 25 gauge needle, I anesthetized the skin and subcutaneous structures with 2 cc of 1% lidocaine. A 25 gauge spinal needle was introduced into the left SI joint confirmed by AP, lateral and oblique projections. Aspiration was negative. I injected 40 mg of Medrol and 1 cc of 1% lidocaine. The needle was flushed with 1% lidocaine and removed intact.  CONDITION POST PROCEDURE:   Stable.  DISCHARGE INSTRUCTIONS: 1. Resume previous diet. 2. Limitations in activities per instruction sheet as reviewed by my assistant    today. 3. Continue on current medications. 4. The patient plans to see Dr. Venetia Maxon in December and I have encouraged her    to proceed with this. 5. I plan to see the patient back on an as needed basis. She plans to    follow-up with Dr. Anne Hahn. I did advise her that we wanted to limit how    many of these injections she got in a year so that we did not accelerate    any degeneration of her SI joint. She and her husband are fully aware of    this. We will go ahead and give them the title of Beverely Risen books    on the back so that she can review these and perform exercise to try and    reduce her discomfort. DD:  05/28/00 TD:  05/28/00 Job: [id number]

## 2010-12-02 NOTE — Op Note (Signed)
[place]  Patient:    SHAWNAY, BRAMEL Visit Number: [id number] MRN: [id number]          Service Type: SUR Location: 3000 [id number] Attending Physician:  Josie Saunders Proc. 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  PROCEDURE PERFORMED:  Anterior cervical diskectomy and fusion C5-6 with allograft bone graft in the anterior cervical plate.  SURGEON:  Danae Orleans. Venetia Maxon, M.D.  ASSISTANT:  Stefani Dama, M.D.  ANESTHESIA:  General endotracheal.  ESTIMATED BLOOD LOSS:  Minimal.  COMPLICATIONS:  None.  DISPOSITION:  To recovery room.  INDICATION:  Phebe Dettmer is a 60 year old woman with left upper extremity radicular pain with foraminal stenosis C5-6 on the left secondary to a herniated disk and degenerative spondylitic disease.  She had biceps weakness on examination.  It was elected to take her surgery for anterior cervical diskectomy and fusion.  DESCRIPTION OF PROCEDURE:  Mrs. Lopez was brought to the operating room. Following satisfactory and uncomplicated induction of general endotracheal anesthesia and placement of intravenous lines, the patient was placed in the supine position on the operating table.  Her neck was placed in slight extension.  She was placed in 10 pounds of Holter traction.  Her anterior neck was then prepped and draped in the usual sterile fashion.  The area of planned incision was infiltrated with 0.25% Marcaine and 1% lidocaine with 1:200,000 epinephrine.  An incision was made from the midline to the anterior border of the sternocleidomastoid muscle and one of the mid neck creases  above the carotid tubercle.  The incision was carried through platysma layer and subplatysmal dissection was performed exposing the anterior border of the sternocleidomastoid muscle using blunt dissection.  The carotid sheath was kept lateral, the trachea and esophagus kept medial, closing the anterior cervical spine.  A spinal needle was placed to what was thought to be the spondylitic level corresponding to the C5-6 level.  This was confirmed on intraoperative x-ray.  Subsequently, the longus coli muscles were taken down bilaterally from C5 to C6 using electrocautery and key elevator.  A self-retaining retractor was placed to facilitate exposure.  The inner space was markedly degenerated.  There was minimal disk space preserved and significant spondylitic ridge at this level.  The inner space was incised with a 15 blade and material was removed in a piece mill fashion.  The fish spreader was placed using a variety of Carlin, micro curets, and pituitary rongeurs.  The endplate of the disk material was removed.  Subsequently, the microscope was brought into the field using dissection technique.  The endplates were stripped of residual cartilaginous disk material and the uncinate spurs were drilled down using a bur.  Subsequently, the remaining osteophytic uncinated spurs were then removed with 2 and 3 mm goal tip Kerrison rongeurs.  The central spinal cord dura was decompressed and both the neural foramen were decompressed.  The C6 nerve roots had fairly vertical takeoffs.  Hemostasis was assured with Gelfoam soaked in thrombin.  The interspace was sized and it was felt that an 8 mm tricortical iliac crest bone graft would fit  appropriately in this level.  This was cut to a depth of 12 mm after rehydration.  This was then inserted and counter sunk appropriately. The ventral spine at this level was then cleared of residual osteophytes and soft tissue, and a trinica plate was then fixed to  the anterior cervical spine.  This was a 24 mm plate and 12 mm screws.  All screws had excellent purchase.  Locking mechanisms were engaged.  Spinal x-ray confirmed position of the bone graft in the anterior cervical plate.  The wound was then copiously irrigated with bacitracin/saline.  The soft tissue was found to be in good repair.  Hemostasis was assured.  The platysma layer was then closed with 3-0 Vicryl sutures.  The subcuticular was reapproximated with 4-0 Vicryl subcuticular stitch.  The wound was dressed with Dermabond.  The patient was extubated in the operating room and taken to the recovery room in stable satisfactory condition having tolerated the operation.  All counts were correct at the end of the case. Attending Physician:  Josie Saunders DD:  02/03/02 TD:  02/06/02 Job: [id number]

## 2010-12-02 NOTE — Procedures (Signed)
[facility]  Patient:    Tina [name], Tina [name]                       MRN: [id number] Adm. Date:  [id number] Attending:  Thyra Breed CC:         Marlan Palau, M.D.                           Procedure Report  PROCEDURE:  Sacroiliac joint injection on the left side.  ANESTHESIOLOGIST:  Thyra Breed, M.D.  DIAGNOSIS:  Left buttock pain and SI joint pain.  HISTORY:  Adrean is a 60 year old who is sent to [place] by Dr. Marlan Palau for injection of her left SI joint.  The patient describes a long history of left buttock discomfort.  She has been followed by Dr. Javier Docker in the past for neck problems and was found to have what she described as a ruptured disk and was advised not to look up.  This was associated with some left shoulder pain.  She later developed mid-lower back pain and was advised that she had a disk bulge.  In addition, she has a Bakers cyst of the left knee and a ganglion over the dorsum of the left foot.  She has had multiple corticosteroid injections for tendinitis of the right elbow.  She began seeing Dr. Anne Hahn in November of 2000 for myoclonus of the lower extremities.  She has had extensive workups since then which have included a normal scan of her brain, multiple normal laboratory investigations.  He has ____ described MRI scans of the lumbosacral and cervical regions without evidence of spinal cord compression.  The patient describes her pain as a numbing deep pain in the left buttock, which is made worse by activity and improved by rest to a degree.  She has been treated with Celebrex and Vioxx in the past with no improvement and actually developed esophagitis to Vioxx.  She has intermittent numbness and tingling in her left buttock region.  She denied bowel and bladder incontinence except for stress incontinence.  She denies weakness.  She occasionally has some tingling-like discomfort along the plantar aspect of the left  foot.  CURRENT MEDICATIONS:  Toprol, Protonix, Loestrin, Claritin and Meclizine p.r.n.  ALLERGIES:  PENICILLIN and ILOSONE.  FAMILY HISTORY:  Positive for osteoarthritis, coronary artery disease and hypertension.  PAST SURGICAL HISTORY:  Significant for tonsillectomy, hernia repair and C-sections x 3.  SOCIAL HISTORY:  The patient teaches computer applications in high school. She does not smoke nor drink alcohol.  ACTIVE MEDICAL PROBLEMS: 1. Recurrent headaches, for which she has been diagnosed as having migraine    headaches in the past. 2. Irregular heart rhythm for which she sees Dr. Maisie Fus A. Brackbill and is on    Toprol. 3. Myoclonus. 4. Varicose veins. 5. "Inner ear problems." 6. Hayfever. 7. History of sleep disruption.  REVIEW OF SYSTEMS:  GENERAL:  Negative.  HEAD:  See active medical problems. EYES:  Significant for contact lens.  EARS:  Significant for inner ear problems with dizziness and ringing in her ears.  NOSE, MOUTH AND THROAT: Negative.  PULMONARY:  Negative.  CARDIOVASCULAR:  See active medical problems.  GI:  History of esophageal irritation secondary to Vioxx.  GU: Negative.  MUSCULOSKELETAL AND NEUROLOGIC:  See HPI.  No history of seizure or stroke.  HEMATOLOGIC:  Negative.  CUTANEOUS:  Negative.  ENDOCRINE:  Negative.  PSYCHIATRIC:  History of sleep disruptions and treated with Zoloft temporarily, but the patient did not feel depressed.  ALLERGY/IMMUNOLOGIC: Positive for hayfever.  PHYSICAL EXAMINATION:  VITAL SIGNS:  Blood pressure is 125/89, heart rate 75, respiratory rate is 12, O2 saturation is 100%, pain level is 6/10 and temperature is 98.4.  GENERAL:  This is a pleasant female who has prominent responses to pain.  HEENT:  Head was normocephalic, atraumatic.  Eyes:  Extraocular movements intact with conjunctivae and sclerae clear.  Nose:  Patent nares.  Oropharynx is free of lesions.  NECK:  Supple without lymphadenopathy.  Carotids are  2+ and symmetric without bruits.  Spurlings sign was negative.  LUNGS:  Clear.  HEART:  Regular rate and rhythm.  BREASTS:  Not performed.  ABDOMEN:  Not performed.  PELVIC:  Not performed.  RECTAL:  Not performed.  BACK:  Exam revealed tenderness over the left SI joint region as well as over the left buttock and sciatic notch.  Straight leg raise signs are negative.  EXTREMITIES:  The patient demonstrated some mild bony enlargement of the PIPs, DIPs and first carpometacarpal joints of the hands as well as tenderness over the right radiohumeral joint.  Radial pulses were 2+ and symmetric.  Lower extremities demonstrated varicosities with dorsalis pedis pulses 2+ and symmetric.  She had some bony enlargement of the midtarsal joints bilaterally with the left greater than the right.  She had a Bakers cyst over the left knee posteriorly.  NEUROLOGIC:  The patient was oriented x 4.  Cranial nerves II-XII were grossly intact.  Deep tendon reflexes were 2+ and symmetric in the upper extremities and brisk in the lower extremities with two beats of clonus bilaterally. Plantar reflexes were downgoing.  Motor was 5/5 with symmetric bulk and tone. Sensation was significant for attenuated vibratory sense over the dorsum of the right foot; otherwise, intact to pinprick.  Coordination was grossly intact.  IMPRESSION: 1. Left buttock and sacroiliac joint pain. 2. Multiple other medical problems including irregular heart beat, migraine    headaches, myoclonus, varicose veins, inner ear problems, hayfever, per    other health care providers.  DISPOSITION:  I discussed the risks/benefit ratios of an SI joint injection in detail with the patient.  Her questions were answered.  DESCRIPTION OF PROCEDURE:  After informed consent was obtained, the patient was taken to the fluoroscopy suite where she was placed in the prone position with a pillow under her abdomen.  Using fluoroscopic guidance, I  identified  the left SI joint.  At the junction of the lower third and middle third, a mark was made on the skin.  The area was prepped with Betadine x 3.  A skin wheal was raised with a 25-gauge needle using 1% lidocaine.  A 25-gauge spinal needle was introduced to the left SI joint, confirmed by AP and lateral projections.  Aspiration was negative for blood and CSF.  I injected 2 cc of 1% lidocaine with 40 mg of Medrol.  The patient tolerated this well.  POST-PROCEDURAL CONDITION:  Stable.  DISPOSITION: 1. Resume previous diet. 2. Limitation in activities per instruction sheet. 3. Continue on current medications which include Toprol, Protonix, Loestrin,    Claritin p.r.n. and Meclizine p.r.n. 4. Follow up with Dr. Anne Hahn as previously arranged. DD:  12/19/99 TD:  12/21/99 Job: [id number]

## 2010-12-20 ENCOUNTER — Telehealth: Payer: Self-pay | Admitting: Cardiology

## 2010-12-20 NOTE — Telephone Encounter (Signed)
Called in because her PCP Dr. Maurice Small advised her to stop taking Metoprolol because she said that the blood pressure was dramaticly dropping. She has spells of cold sweats and is concerned because the medication was for heart palpitations. Please call back.

## 2010-12-20 NOTE — Telephone Encounter (Signed)
blood pressure 100/60, 99/58, and today 110/73. Has episodes of getting hot, clammy, pale, and feels bad.  At church once when it happened and went home and slept for hours.  PCP decreased from metoprolol from 100 mg to 50 mg daily. No difference with decreased dose. PCP wants her to discontinue completely. Doesn't feel she should stop completely.  Please advise.

## 2010-12-21 NOTE — Telephone Encounter (Signed)
Left message

## 2010-12-21 NOTE — Telephone Encounter (Signed)
Decrease metoprolol to 25 mg daily and increase dietary salt to help support blood pressure.

## 2010-12-21 NOTE — Telephone Encounter (Signed)
Advised patient to decrease metoprolol and increase salt intake. Verbalized understanding and will call back if any problems or no better

## 2011-01-24 ENCOUNTER — Other Ambulatory Visit: Payer: Self-pay | Admitting: *Deleted

## 2011-01-24 DIAGNOSIS — E785 Hyperlipidemia, unspecified: Secondary | ICD-10-CM

## 2011-01-30 ENCOUNTER — Other Ambulatory Visit (INDEPENDENT_AMBULATORY_CARE_PROVIDER_SITE_OTHER): Payer: BC Managed Care – PPO | Admitting: *Deleted

## 2011-01-30 DIAGNOSIS — E785 Hyperlipidemia, unspecified: Secondary | ICD-10-CM

## 2011-01-30 LAB — BASIC METABOLIC PANEL
BUN: 22 mg/dL (ref 6–23)
CO2: 30 mEq/L (ref 19–32)
Calcium: 9.1 mg/dL (ref 8.4–10.5)
GFR: 106.25 mL/min (ref >60.00)
Glucose, Bld: 95 mg/dL (ref 70–99)
Sodium: 138 mEq/L (ref 135–145)

## 2011-01-30 LAB — LIPID PANEL
Cholesterol: 174 mg/dL (ref 0–200)
Triglycerides: 148 mg/dL (ref 0.0–149.0)
VLDL: 29.6 mg/dL (ref 0.0–40.0)

## 2011-01-30 LAB — HEPATIC FUNCTION PANEL
AST: 24 U/L (ref 0–37)
Total Protein: 6.9 g/dL (ref 6.0–8.3)

## 2011-01-31 ENCOUNTER — Encounter: Payer: Self-pay | Admitting: Cardiology

## 2011-02-01 ENCOUNTER — Ambulatory Visit (INDEPENDENT_AMBULATORY_CARE_PROVIDER_SITE_OTHER): Payer: BC Managed Care – PPO | Admitting: Cardiology

## 2011-02-01 ENCOUNTER — Encounter: Payer: Self-pay | Admitting: Cardiology

## 2011-02-01 DIAGNOSIS — E785 Hyperlipidemia, unspecified: Secondary | ICD-10-CM

## 2011-02-01 DIAGNOSIS — R42 Dizziness and giddiness: Secondary | ICD-10-CM

## 2011-02-01 DIAGNOSIS — F32A Depression, unspecified: Secondary | ICD-10-CM

## 2011-02-01 DIAGNOSIS — F341 Dysthymic disorder: Secondary | ICD-10-CM

## 2011-02-01 DIAGNOSIS — F419 Anxiety disorder, unspecified: Secondary | ICD-10-CM | POA: Insufficient documentation

## 2011-02-01 DIAGNOSIS — Z79899 Other long term (current) drug therapy: Secondary | ICD-10-CM

## 2011-02-01 DIAGNOSIS — E7800 Pure hypercholesterolemia, unspecified: Secondary | ICD-10-CM

## 2011-02-01 DIAGNOSIS — R002 Palpitations: Secondary | ICD-10-CM

## 2011-02-01 DIAGNOSIS — F329 Major depressive disorder, single episode, unspecified: Secondary | ICD-10-CM | POA: Insufficient documentation

## 2011-02-01 HISTORY — DX: Dizziness and giddiness: R42

## 2011-02-01 HISTORY — DX: Depression, unspecified: F32.A

## 2011-02-01 HISTORY — DX: Anxiety disorder, unspecified: F41.9

## 2011-02-01 NOTE — Assessment & Plan Note (Signed)
Patient has hypercholesterolemia.  She is on low dose Lipitor.  She's not having any side effects such as myopathy from the statin therapy.  Her  Blood work is pending.

## 2011-02-01 NOTE — Progress Notes (Signed)
Tina [name] Date of Birth:  03-10-1951 [facility] [address]   [address] [place], [place]  [address] [phone]           Fax   [phone]  History of Present Illness: This pleasant 60 year old woman is seen for a six-month followup visit.  She has a long history of palpitations.  She has a history of mild mitral regurgitation.  His been under a lot of chronic stress dealing with her parents with whom have Alzheimer's.  Patient herself has been experiencing some intermittent episodes of developing sudden weakness and diaphoresis and pallor and a feeling of near syncope.  Sometimes these associated symptoms are associated with a feeling of shakiness that eating some sugar we'll resolved.  At other times she has to lie down and go to bed.  The patient does not have any history of ischemic heart disease.  She had a nuclear stress test on 02/13/07 which was negative for ischemia.  Her last echocardiogram was 01/06/08 and showed bowing of the mitral leaflets with mild mitral regurgitation and showed normal left ventricular systolic function with impaired relaxation.  The patient has a past history of hypercholesterolemia and is on low dose statin therapy.  The patient has a history severe symptomatic hiatal hernia and underwent a Nissen fundoplication twice, once in 2004 and again in 2009.   Current Outpatient Prescriptions  Medication Sig Dispense Refill  . acetaminophen (TYLENOL) 325 MG tablet Take 650 mg by mouth as needed.        [name] aspirin 81 MG tablet Take 81 mg by mouth daily.        [name] atorvastatin (LIPITOR) 20 MG tablet Take 20 mg by mouth daily.        . Coenzyme Q10 (COQ-10 PO) Take by mouth.        [name] FLUoxetine HCl (PROZAC PO) Take by mouth.        . meclizine (ANTIVERT) 25 MG tablet Take 25 mg by mouth as needed.        . meloxicam (MOBIC) 15 MG tablet Take 15 mg by mouth daily.        . metoprolol (LOPRESSOR) 50 MG tablet Take 50 mg by mouth 2 (two)  times daily. 50mg  in the am and 25mg  in the pm      . multivitamin (THERAGRAN) per tablet Take 1 tablet by mouth daily.        . raloxifene (EVISTA) 60 MG tablet Take 60 mg by mouth daily.          Allergies  Allergen Reactions  . Codeine     nausea  . Penicillins     rash  . Simvastatin     Leg pain    Patient Active Problem List  Diagnoses  . GERD (gastroesophageal reflux disease)  . Hypercholesterolemia  . Heart palpitations  . Postural dizziness  . Anxiety and depression    History  Smoking status  . Never Smoker   Smokeless tobacco  . Not on file    History  Alcohol Use: Not on file    Family History  Problem Relation Age of Onset  . Alzheimer's disease Mother   . Alzheimer's disease Father     Review of Systems: Constitutional: no fever chills diaphoresis or fatigue or change in weight.  Head and neck: no hearing loss, no epistaxis, no photophobia or visual disturbance. Respiratory: No cough, shortness of breath or wheezing. Cardiovascular: No chest pain peripheral edema,  palpitations. Gastrointestinal: No abdominal distention, no abdominal pain, no change in bowel habits hematochezia or melena. Genitourinary: No dysuria, no frequency, no urgency, no nocturia. Musculoskeletal:No arthralgias, no back pain, no gait disturbance or myalgias. Neurological: No dizziness, no headaches, no numbness, no seizures, no syncope, no weakness, no tremors. Hematologic: No lymphadenopathy, no easy bruising. Psychiatric: No confusion, no hallucinations, no sleep disturbance.    Physical Exam: Filed Vitals:   02/01/11 0927  BP: 114/70  Pulse: 80    The general appearance reveals a well-developed well-nourished woman in no distress.The head and neck exam reveals pupils equal and reactive.  Extraocular movements are full.  There is no scleral icterus.  The mouth and pharynx are normal.  The neck is supple.  The carotids reveal no bruits.  The jugular venous pressure is  normal.  The  thyroid is not enlarged.  There is no lymphadenopathy.  The chest is clear to percussion and auscultation.  There are no rales or rhonchi.  Expansion of the chest is symmetrical.  The precordium is quiet.  The first heart sound is normal.  The second heart sound is physiologically split.  There is no murmur gallop rub or click.  There is no abnormal lift or heave.  The abdomen is soft and nontender.  The bowel sounds are normal.  The liver and spleen are not enlarged.  There are no abdominal masses.  There are no abdominal bruits.  Extremities reveal good pedal pulses.  There is no phlebitis or edema.  There is no cyanosis or clubbing.  Strength is normal and symmetrical in all extremities.  There is no lateralizing weakness.  There are no sensory deficits.  The skin is warm and dry.  There is no rash.     Assessment / Plan: The patient is to continue same medication.  When she has these episodes she will try to increase oral fluids.  She needs to check her blood pressure more often and when it is low she can increase dietary salt or drink Gatorade.  She is asking about tapering off the Prozac and this will be okay to try.  She will initially go to every other day.  She'll be rechecked here in 6 months for followup office visit and EKG and fasting lab work.

## 2011-02-01 NOTE — Assessment & Plan Note (Signed)
The patient is seen for a scheduled followup office visit.  Generally she has been feeling well but has had episodes of feeling like she is going to pass out.  He becomes very sweaty and clammy and nauseated.  When she has checked her blood pressure during those episodes it is low.  At times she also feels very weak and shaky and when that happens she can corrected by eating something sweet.  She is not known to be diabetic

## 2011-02-01 NOTE — Assessment & Plan Note (Signed)
Patient has a past history of anxiety and depression.  She has been on long-standing generic Prozac.  She's been taking it daily but would like to come off it if possible and she will start by cutting it down to every other day since it is a capsule.  The patient is still under a lot of stress looking after her aged parents both of whom have Alzheimer's and  Are In a facility together down in [facility].

## 2011-03-30 ENCOUNTER — Other Ambulatory Visit: Payer: Self-pay | Admitting: *Deleted

## 2011-03-30 DIAGNOSIS — I119 Hypertensive heart disease without heart failure: Secondary | ICD-10-CM

## 2011-03-30 MED ORDER — METOPROLOL TARTRATE 50 MG PO TABS: ORAL_TABLET | ORAL | Status: DC

## 2011-03-30 MED ORDER — METOPROLOL TARTRATE 50 MG PO TABS
50.0000 mg | ORAL_TABLET | Freq: Two times a day (BID) | ORAL | Status: DC
Start: 2011-03-30 — End: 2011-03-30

## 2011-04-06 LAB — CBC
Hemoglobin: 11.7 — ABNORMAL LOW
MCHC: 34
MCV: 91.1
Platelets: 219
RBC: 3.76 — ABNORMAL LOW
RDW: 12.5

## 2011-04-06 LAB — DIFFERENTIAL
Basophils Absolute: 0.1
Lymphocytes Relative: 20
Neutro Abs: 5.7
Neutrophils Relative %: 68

## 2011-04-06 LAB — BASIC METABOLIC PANEL
BUN: 14
Chloride: 103
Glucose, Bld: 93
Potassium: 4.2

## 2011-07-18 HISTORY — PX: COLONOSCOPY: SHX174

## 2011-08-01 ENCOUNTER — Ambulatory Visit: Payer: BC Managed Care – PPO | Admitting: Cardiology

## 2011-08-01 ENCOUNTER — Encounter: Payer: Self-pay | Admitting: Cardiology

## 2011-08-01 ENCOUNTER — Ambulatory Visit (INDEPENDENT_AMBULATORY_CARE_PROVIDER_SITE_OTHER): Payer: BC Managed Care – PPO | Admitting: Cardiology

## 2011-08-01 VITALS — BP 117/71 | HR 66 | Resp 16 | Ht 65.0 in | Wt 147.0 lb

## 2011-08-01 DIAGNOSIS — E7800 Pure hypercholesterolemia, unspecified: Secondary | ICD-10-CM

## 2011-08-01 DIAGNOSIS — F329 Major depressive disorder, single episode, unspecified: Secondary | ICD-10-CM

## 2011-08-01 DIAGNOSIS — F341 Dysthymic disorder: Secondary | ICD-10-CM

## 2011-08-01 DIAGNOSIS — F32A Depression, unspecified: Secondary | ICD-10-CM

## 2011-08-01 DIAGNOSIS — R002 Palpitations: Secondary | ICD-10-CM

## 2011-08-01 LAB — LIPID PANEL
HDL: 45.1 mg/dL (ref >39.00)
LDL Cholesterol: 79 mg/dL (ref 0–99)
Total CHOL/HDL Ratio: 3

## 2011-08-01 LAB — HEPATIC FUNCTION PANEL
ALT: 15 U/L (ref 0–35)
Alkaline Phosphatase: 79 U/L (ref 39–117)
Bilirubin, Direct: 0.1 mg/dL (ref 0.0–0.3)
Total Protein: 7.4 g/dL (ref 6.0–8.3)

## 2011-08-01 LAB — BASIC METABOLIC PANEL
CO2: 28 mEq/L (ref 19–32)
Calcium: 9 mg/dL (ref 8.4–10.5)
Chloride: 102 mEq/L (ref 96–112)
Sodium: 140 mEq/L (ref 135–145)

## 2011-08-01 NOTE — Patient Instructions (Signed)
Will obtain labs today and call you with the results (lp/bmet/hfp/cbc) Your physician recommends that you continue on your current medications as directed. Please refer to the Current Medication list given to you today. Your physician wants you to follow-up in: 6 months You will receive a reminder letter in the mail two months in advance. If you don't receive a letter, please call our office to schedule the follow-up appointment.

## 2011-08-01 NOTE — Assessment & Plan Note (Signed)
The patient has a history of hypercholesterolemia.  We are checking labs today.  She has not been able to get much regular exercise because of her family situation and her recent right leg fracture.

## 2011-08-01 NOTE — Assessment & Plan Note (Signed)
The patient has had more symptoms of anxiety and depression recently.  She remains on Prozac as directed.  She has had increasing palpitations related to stress and is said to take extra half tablet of metoprolol frequently.

## 2011-08-01 NOTE — Progress Notes (Signed)
Tina [name] Date of Birth:  1951/05/02 [facility] [address] [place], [place]  [address] [phone]         Fax   [phone]  History of Present Illness: This pleasant 61 year old woman is seen for a scheduled followup office visit.  She has a past history of palpitations and a history of hypercholesterolemia.  In the past she has also had problems with orthostatic hypotension.  Since we last saw her she has been doing reasonably well but has been under more stress.  Her father who has severe dementia is now in hospice and his defibrillator has been turned off.  In addition, the patient has not been able to get any regular exercise because she fell and broke her right fibula in September and was in a cast and then a walking boot until December.  Patient has a past history of vertigo but has had no recent episodes of severe vertigo.  Current Outpatient Prescriptions  Medication Sig Dispense Refill  . acetaminophen (TYLENOL) 325 MG tablet Take 650 mg by mouth as needed.        [name] aspirin 81 MG tablet Take 81 mg by mouth daily.        [name] atorvastatin (LIPITOR) 20 MG tablet Take 20 mg by mouth daily.        . Coenzyme Q10 (COQ-10 PO) Take by mouth.        . dexlansoprazole (DEXILANT) 60 MG capsule Take 60 mg by mouth daily.      [name] FLUoxetine HCl (PROZAC PO) Take by mouth.        . meclizine (ANTIVERT) 25 MG tablet Take 25 mg by mouth as needed.        . metoprolol (LOPRESSOR) 50 MG tablet Take 50 mg by mouth 2 (two) times daily.       . multivitamin (THERAGRAN) per tablet Take 1 tablet by mouth daily.        . raloxifene (EVISTA) 60 MG tablet Take 60 mg by mouth daily.          Allergies  Allergen Reactions  . Codeine     nausea  . Penicillins     rash  . Simvastatin     Leg pain    Patient Active Problem List  Diagnoses  . GERD (gastroesophageal reflux disease)  . Hypercholesterolemia  . Heart palpitations  . Postural dizziness  . Anxiety and  depression    History  Smoking status  . Never Smoker   Smokeless tobacco  . Not on file    History  Alcohol Use: Not on file    Family History  Problem Relation Age of Onset  . Alzheimer's disease Mother   . Alzheimer's disease Father     Review of Systems: Constitutional: no fever chills diaphoresis or fatigue or change in weight.  Head and neck: no hearing loss, no epistaxis, no photophobia or visual disturbance. Respiratory: No cough, shortness of breath or wheezing. Cardiovascular: No chest pain peripheral edema, palpitations. Gastrointestinal: No abdominal distention, no abdominal pain, no change in bowel habits hematochezia or melena. Genitourinary: No dysuria, no frequency, no urgency, no nocturia. Musculoskeletal:No arthralgias, no back pain, no gait disturbance or myalgias. Neurological: No dizziness, no headaches, no numbness, no seizures, no syncope, no weakness, no tremors. Hematologic: No lymphadenopathy, no easy bruising. Psychiatric: No confusion, no hallucinations, no sleep disturbance.    Physical Exam: Filed Vitals:   08/01/11 0900  BP: 117/71  Pulse: 66  Resp:  16   the general appearance reveals a well-developed well-nourished woman in no distress.  The head and neck exam reveals pupils equal and reactive.  Extraocular movements are full.  There is no scleral icterus.  The mouth and pharynx are normal.  The neck is supple.  The carotids reveal no bruits.  The jugular venous pressure is normal.  The  thyroid is not enlarged.  There is no lymphadenopathy.  The chest is clear to percussion and auscultation.  There are no rales or rhonchi.  Expansion of the chest is symmetrical.  The precordium is quiet.  The first heart sound is normal.  The second heart sound is physiologically split.  There is no murmur gallop rub or click.  There is no abnormal lift or heave.  The abdomen is soft and nontender.  The bowel sounds are normal.  The liver and spleen are not  enlarged.  There are no abdominal masses.  There are no abdominal bruits.  Extremities reveal good pedal pulses.  There is no phlebitis or edema.  There is no cyanosis or clubbing.  Strength is normal and symmetrical in all extremities.  There is no lateralizing weakness.  There are no sensory deficits.  The skin is warm and dry.  There is no rash.     Assessment / Plan: Continue same medication.  She may take an extra metoprolol one half tablet as necessary to control stress-induced palpitations.  Sugar rechecked in 6 months for followup office visit EKG and fasting lab work.  Lab Work today pending

## 2011-08-04 ENCOUNTER — Telehealth: Payer: Self-pay | Admitting: Cardiology

## 2011-08-04 NOTE — Telephone Encounter (Signed)
Mailed copy again

## 2011-08-04 NOTE — Telephone Encounter (Signed)
Rtn call from susie re Lab results from yesterday, prior to that got a call from [place] who said she would drop it in the mail, as of right now she hasn't gotten it, but if you already did great, if not go ahead and drop it in the mail, she knows you are busy so no need to call

## 2011-08-15 ENCOUNTER — Ambulatory Visit: Payer: BC Managed Care – PPO | Admitting: Cardiology

## 2011-10-05 ENCOUNTER — Telehealth: Payer: Self-pay | Admitting: Cardiology

## 2011-10-05 MED ORDER — METOPROLOL TARTRATE 50 MG PO TABS: 50.0000 mg | ORAL_TABLET | Freq: Two times a day (BID) | ORAL | Status: DC

## 2011-10-05 NOTE — Telephone Encounter (Signed)
FU Call: Pt returning call to [facility] stating that she needs a new RX of metoprolol [facility] [place]. Pt stated it is not time for pt to get refill but considering MD increased pt dosing directions to 2x/day pt is out of rx; therefore pt needs new RX reflecting medication dosing changes [facility]. Please return pt call to discuss further if necessary.

## 2011-10-05 NOTE — Telephone Encounter (Signed)
New msg Pt wants to discuss  metoprolol dosage with you. [facility] send to [facility]

## 2011-10-05 NOTE — Telephone Encounter (Signed)
Sent to pharmacy with new dose.  Left patient message

## 2011-10-05 NOTE — Telephone Encounter (Signed)
Left message

## 2011-11-14 LAB — HM COLONOSCOPY: HM Colonoscopy: NORMAL

## 2011-12-19 ENCOUNTER — Other Ambulatory Visit: Payer: Self-pay | Admitting: Cardiology

## 2011-12-20 NOTE — Telephone Encounter (Signed)
Refilled lipitor 

## 2012-01-05 ENCOUNTER — Encounter: Payer: Self-pay | Admitting: Cardiology

## 2012-01-05 ENCOUNTER — Other Ambulatory Visit: Payer: Self-pay | Admitting: Cardiology

## 2012-02-07 ENCOUNTER — Ambulatory Visit: Payer: BC Managed Care – PPO | Admitting: Cardiology

## 2012-02-27 ENCOUNTER — Encounter: Payer: Self-pay | Admitting: Cardiology

## 2012-02-27 ENCOUNTER — Other Ambulatory Visit (INDEPENDENT_AMBULATORY_CARE_PROVIDER_SITE_OTHER): Payer: BC Managed Care – PPO

## 2012-02-27 ENCOUNTER — Ambulatory Visit (INDEPENDENT_AMBULATORY_CARE_PROVIDER_SITE_OTHER): Payer: BC Managed Care – PPO | Admitting: Cardiology

## 2012-02-27 VITALS — BP 102/68 | HR 59 | Ht 65.0 in | Wt 141.8 lb

## 2012-02-27 DIAGNOSIS — R002 Palpitations: Secondary | ICD-10-CM

## 2012-02-27 DIAGNOSIS — E7800 Pure hypercholesterolemia, unspecified: Secondary | ICD-10-CM

## 2012-02-27 DIAGNOSIS — R0989 Other specified symptoms and signs involving the circulatory and respiratory systems: Secondary | ICD-10-CM

## 2012-02-27 DIAGNOSIS — K219 Gastro-esophageal reflux disease without esophagitis: Secondary | ICD-10-CM

## 2012-02-27 LAB — BASIC METABOLIC PANEL
BUN: 14 mg/dL (ref 6–23)
Calcium: 8.9 mg/dL (ref 8.4–10.5)
Creatinine, Ser: 0.7 mg/dL (ref 0.4–1.2)
GFR: 95.01 mL/min (ref >60.00)
Potassium: 4.1 mEq/L (ref 3.5–5.1)

## 2012-02-27 LAB — HEPATIC FUNCTION PANEL
ALT: 16 U/L (ref 0–35)
AST: 22 U/L (ref 0–37)
Bilirubin, Direct: 0 mg/dL (ref 0.0–0.3)
Total Protein: 7.3 g/dL (ref 6.0–8.3)

## 2012-02-27 LAB — LIPID PANEL
Cholesterol: 150 mg/dL (ref 0–200)
VLDL: 31.4 mg/dL (ref 0.0–40.0)

## 2012-02-27 NOTE — Assessment & Plan Note (Signed)
The patient has a past history of significant gastroesophageal reflux problems.  She is on Dexilant 60 mg daily with good control

## 2012-02-27 NOTE — Progress Notes (Signed)
Tina [name] Date of Birth:  1951-05-22 [facility] [address] [place], [place]  [address] [phone]         Fax   [phone]  History of Present Illness: This pleasant 61 year old woman is seen for a six-month followup office visit.  She has a past history of palpitations a history of hypercholesterolemia.  In the past she has also had problems with low blood pressure and orthostatic hypotension.  Since we last saw her, her father is deceased in her mother is in a skilled nursing facility in [place] and has severe dementia and Parkinson's and is totally helpless.  The patient goes to try to feed her her lunch each day.  The patient has been experiencing some occasional palpitations.  Her blood pressure has been low at times.  Current Outpatient Prescriptions  Medication Sig Dispense Refill  . acetaminophen (TYLENOL) 325 MG tablet Take 650 mg by mouth as needed.        [name] aspirin 81 MG tablet Take 81 mg by mouth daily.        [name] atorvastatin (LIPITOR) 20 MG tablet TAKE 1 TABLET BY MOUTH EVERY DAY  90 tablet  3  . Coenzyme Q10 (COQ-10 PO) Take by mouth.        . dexlansoprazole (DEXILANT) 60 MG capsule Take 60 mg by mouth daily.      . meclizine (ANTIVERT) 25 MG tablet Take 25 mg by mouth as needed.        . metoprolol (LOPRESSOR) 50 MG tablet Take 1 tablet (50 mg total) by mouth 2 (two) times daily.  180 tablet  3  . multivitamin (THERAGRAN) per tablet Take 1 tablet by mouth daily.        [name] NAPROXEN PO Take 220 mg by mouth as directed.      . raloxifene (EVISTA) 60 MG tablet Take 60 mg by mouth daily.          Allergies  Allergen Reactions  . Codeine     nausea  . Penicillins     rash  . Simvastatin     Leg pain    Patient Active Problem List  Diagnosis  . GERD (gastroesophageal reflux disease)  . Hypercholesterolemia  . Heart palpitations  . Postural dizziness  . Anxiety and depression    History  Smoking status  . Never Smoker   Smokeless  tobacco  . Not on file    History  Alcohol Use: Not on file    Family History  Problem Relation Age of Onset  . Alzheimer's disease Mother   . Alzheimer's disease Father     Review of Systems: Constitutional: no fever chills diaphoresis or fatigue or change in weight.  Head and neck: no hearing loss, no epistaxis, no photophobia or visual disturbance. Respiratory: No cough, shortness of breath or wheezing. Cardiovascular: No chest pain peripheral edema, palpitations. Gastrointestinal: No abdominal distention, no abdominal pain, no change in bowel habits hematochezia or melena. Genitourinary: No dysuria, no frequency, no urgency, no nocturia. Musculoskeletal:No arthralgias, no back pain, no gait disturbance or myalgias. Neurological: No dizziness, no headaches, no numbness, no seizures, no syncope, no weakness, no tremors. Hematologic: No lymphadenopathy, no easy bruising. Psychiatric: No confusion, no hallucinations, no sleep disturbance.    Physical Exam: Filed Vitals:   02/27/12 0853  BP: 102/68  Pulse: 59   general appearance reveals a well-developed well-nourished middle-age woman in no distress.The head and neck exam reveals pupils equal and reactive.  Extraocular movements are full.  There is no scleral icterus.  The mouth and pharynx are normal.  The neck is supple.  The carotids reveal no bruits.  The jugular venous pressure is normal.  The  thyroid is not enlarged.  There is no lymphadenopathy.  The chest is clear to percussion and auscultation.  There are no rales or rhonchi.  Expansion of the chest is symmetrical.  The precordium is quiet.  The first heart sound is normal.  The second heart sound is physiologically split.  There is no murmur gallop rub or click.  There is no abnormal lift or heave.  The abdomen is soft and nontender.  The bowel sounds are normal.  The liver and spleen are not enlarged.  There are no abdominal masses.  There are no abdominal bruits.   Extremities reveal good pedal pulses.  There is no phlebitis or edema.  There is no cyanosis or clubbing.  Strength is normal and symmetrical in all extremities.  There is no lateralizing weakness.  There are no sensory deficits.  The skin is warm and dry.  There is no rash.  EKG shows normal sinus rhythm and is within normal limits   Assessment / Plan: Continue same medication and be rechecked in 6 months for followup office visit lipid panel hepatic function panel and basal metabolic panel.  Needs to try to carve out some time for herself so she can get back to the [facility] and continue aerobic exercise.  She brought me up to date on both of her children.  Her son recently graduated from [employer] and has a job in [address] and her daughter recently graduated from [employer] and was married in June and is living in [place].

## 2012-02-27 NOTE — Patient Instructions (Addendum)
Will obtain labs today and call you with the results   Your physician recommends that you continue on your current medications as directed. Please refer to the Current Medication list given to you today.  Your physician wants you to follow-up in: 6 months with fasting labs (lp/bmet/hfp)  You will receive a reminder letter in the mail two months in advance. If you don't receive a letter, please call our office to schedule the follow-up appointment.  

## 2012-02-27 NOTE — Assessment & Plan Note (Signed)
The patient is on Lipitor 20 mg daily for her cholesterol.  She is not having a myalgias.

## 2012-02-27 NOTE — Assessment & Plan Note (Signed)
The patient has been experiencing occasional palpitations exacerbated by the stress of her mother's severe illness.  The patient has not been experiencing any chest pain.  Her electrocardiogram today is satisfactory.  She remains on metoprolol

## 2012-02-28 ENCOUNTER — Telehealth: Payer: Self-pay | Admitting: Cardiology

## 2012-02-28 NOTE — Telephone Encounter (Signed)
Message copied by Burnell Blanks on Wed Feb 28, 2012  5:12 PM ------      Message from: Cassell Clement      Created: Wed Feb 28, 2012  3:28 PM       Please report to patient.  The recent labs are stable. Continue same medication and careful diet.  The triglycerides are too high at 157 and she needs to cut back on candy and sweets

## 2012-02-28 NOTE — Telephone Encounter (Signed)
Advised patient of lab results  

## 2012-02-28 NOTE — Telephone Encounter (Signed)
Fu call °Pt returning your call  °

## 2012-02-28 NOTE — Progress Notes (Signed)
Quick Note:  Please report to patient. The recent labs are stable. Continue same medication and careful diet. The triglycerides are too high at 157 and she needs to cut back on candy and sweets ______

## 2012-03-07 ENCOUNTER — Telehealth: Payer: Self-pay | Admitting: Cardiology

## 2012-03-07 NOTE — Telephone Encounter (Signed)
Faxed copy of labs

## 2012-03-07 NOTE — Telephone Encounter (Signed)
New  msg Pt wants to talk to you about health assessment info she needs

## 2012-03-08 ENCOUNTER — Telehealth: Payer: Self-pay | Admitting: Cardiology

## 2012-03-08 NOTE — Telephone Encounter (Signed)
New msg Pt wants lab results faxed to her at [phone] for her wellness program.

## 2012-03-19 ENCOUNTER — Telehealth: Payer: Self-pay | Admitting: Cardiology

## 2012-03-19 NOTE — Telephone Encounter (Signed)
Left message to call back  

## 2012-03-19 NOTE — Telephone Encounter (Signed)
Pt not sure if needs appt, pls call, inside r leg, has a stinging, blue and swollen, thinks something went wrong in her bloodstream, pls advise

## 2012-03-20 ENCOUNTER — Ambulatory Visit (INDEPENDENT_AMBULATORY_CARE_PROVIDER_SITE_OTHER): Payer: BC Managed Care – PPO | Admitting: Cardiology

## 2012-03-20 ENCOUNTER — Encounter: Payer: Self-pay | Admitting: Cardiology

## 2012-03-20 VITALS — BP 110/60 | HR 78 | Ht 65.0 in | Wt 141.0 lb

## 2012-03-20 DIAGNOSIS — M79604 Pain in right leg: Secondary | ICD-10-CM

## 2012-03-20 DIAGNOSIS — M79609 Pain in unspecified limb: Secondary | ICD-10-CM

## 2012-03-20 HISTORY — DX: Pain in right leg: M79.604

## 2012-03-20 NOTE — Telephone Encounter (Signed)
Patient here for office visittoday

## 2012-03-20 NOTE — Progress Notes (Signed)
Tina [name] Date of Birth:  1950/07/18 [facility] [address] [place], [place]  [address] [phone]  Fax   [phone]  HPI: This pleasant 61 year old woman is seen for a work in office visit. She has a past history of palpitations a history of hypercholesterolemia. In the past she has also had problems with low blood pressure and orthostatic hypotension. Since we last saw her, her father is deceased in her mother is in a skilled nursing facility in [place] and has severe dementia and Parkinson's and is totally helpless. The patient goes to try to feed her her lunch each day. The patient has been experiencing some occasional palpitations. Her blood pressure has been low at times. Today the patient comes in as a work in.  She is concerned about pain in her right lower leg.  One week ago while [place] she felt a sudden stinging sensation and noted a sudden area of swelling.  It looks like it tubing was very solid and firm.  It subsequently became red and appeared to be filled with blood.  Since then the firm area has subsided but she is left with an area of ecchymosis the size of a tennis ball.  Initially the patient applied ice to the lesion which was appropriate.  There is no history of any trauma.  There was no evidence of any insect bite.  This appeared to be a spontaneous hemorrhage from one of her subcutaneous veins in the right lower lateral calf.   Current Outpatient Prescriptions  Medication Sig Dispense Refill  . acetaminophen (TYLENOL) 325 MG tablet Take 650 mg by mouth as needed.        [name] aspirin 81 MG tablet Take 81 mg by mouth daily.        [name] atorvastatin (LIPITOR) 20 MG tablet TAKE 1 TABLET BY MOUTH EVERY DAY  90 tablet  3  . Coenzyme Q10 (COQ-10 PO) Take by mouth.        . dexlansoprazole (DEXILANT) 60 MG capsule Take 60 mg by mouth daily.      . meclizine (ANTIVERT) 25 MG tablet Take 25 mg by mouth as  needed.        . metoprolol (LOPRESSOR) 50 MG tablet Take 1 tablet (50 mg total) by mouth 2 (two) times daily.  180 tablet  3  . multivitamin (THERAGRAN) per tablet Take 1 tablet by mouth daily.        [name] NAPROXEN PO Take 220 mg by mouth as directed.      . raloxifene (EVISTA) 60 MG tablet Take 60 mg by mouth daily.          Allergies  Allergen Reactions  . Codeine     nausea  . Penicillins     rash  . Simvastatin     Leg pain    Patient Active Problem List  Diagnosis  . GERD (gastroesophageal reflux disease)  . Hypercholesterolemia  . Heart palpitations  . Postural dizziness  . Anxiety and depression  . Leg pain, right    History  Smoking status  . Never Smoker   Smokeless tobacco  . Not on file    History  Alcohol Use: Not on file    Family History  Problem Relation Age of Onset  . Alzheimer's disease Mother   . Alzheimer's disease Father     Review of Systems: The patient denies any heat or cold intolerance.  No weight gain or weight loss.  The patient denies headaches or blurry vision.  There is no cough or sputum production.  The patient denies dizziness.  There is no hematuria or hematochezia.  The patient denies any muscle aches or arthritis.  The patient denies any rash.  The patient denies frequent falling or instability.  There is no history of depression or anxiety.  All other systems were reviewed and are negative.   Physical Exam: Filed Vitals:   03/20/12 1459  BP: 110/60  Pulse: 78   general appearance reveals a well-developed well-nourished woman in no distress.The head and neck exam reveals pupils equal and reactive.  Extraocular movements are full.  There is no scleral icterus.  The mouth and pharynx are normal.  The neck is supple.  The carotids reveal no bruits.  The jugular venous pressure is normal.  The  thyroid is not enlarged.  There is no lymphadenopathy.  The chest is clear to percussion and auscultation.  There are no rales or rhonchi.   Expansion of the chest is symmetrical.  The precordium is quiet.  The first heart sound is normal.  The second heart sound is physiologically split.  There is no murmur gallop rub or click.  There is no abnormal lift or heave.  The abdomen is soft and nontender.  The bowel sounds are normal.  The liver and spleen are not enlarged.  There are no abdominal masses.  There are no abdominal bruits.  Extremities reveal good pedal pulses.  There is no phlebitis or edema.  The right leg shows a tennis ball sized area of ecchymosis on the right anterolateral aspect of the lower leg below the knee the area is no longer raised or firm.  It is flat for the skin.  She has excellent pedal pulses and no evidence of peripheral edema or deep vein thrombosis.  There is no cyanosis or clubbing.  Strength is normal and symmetrical in all extremities.  There is no lateralizing weakness.  There are no sensory deficits.  The skin is warm and dry.  There is no rash.      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  Disposition reassurance. Continue same medication and keep six-month appointment as scheduled

## 2012-03-20 NOTE — Patient Instructions (Addendum)
Your physician recommends that you continue on your current medications as directed. Please refer to the Current Medication list given to you today.  Follow up as needed  

## 2012-08-28 ENCOUNTER — Other Ambulatory Visit (INDEPENDENT_AMBULATORY_CARE_PROVIDER_SITE_OTHER): Payer: BC Managed Care – PPO

## 2012-08-28 DIAGNOSIS — E7800 Pure hypercholesterolemia, unspecified: Secondary | ICD-10-CM

## 2012-08-28 LAB — HEPATIC FUNCTION PANEL
ALT: 17 U/L (ref 0–35)
AST: 24 U/L (ref 0–37)
Alkaline Phosphatase: 73 U/L (ref 39–117)
Bilirubin, Direct: 0.1 mg/dL (ref 0.0–0.3)
Total Bilirubin: 0.6 mg/dL (ref 0.3–1.2)
Total Protein: 7.3 g/dL (ref 6.0–8.3)

## 2012-08-28 LAB — BASIC METABOLIC PANEL
BUN: 17 mg/dL (ref 6–23)
Calcium: 9.1 mg/dL (ref 8.4–10.5)
Creatinine, Ser: 0.7 mg/dL (ref 0.4–1.2)
GFR: 93.24 mL/min (ref >60.00)
Glucose, Bld: 88 mg/dL (ref 70–99)

## 2012-08-28 LAB — LIPID PANEL
Cholesterol: 140 mg/dL (ref 0–200)
VLDL: 21 mg/dL (ref 0.0–40.0)

## 2012-08-28 NOTE — Progress Notes (Signed)
Quick Note:  Please make copy of labs for patient visit. ______ 

## 2012-08-30 ENCOUNTER — Other Ambulatory Visit: Payer: BC Managed Care – PPO

## 2012-09-02 ENCOUNTER — Telehealth: Payer: Self-pay | Admitting: Cardiology

## 2012-09-02 NOTE — Telephone Encounter (Signed)
New Problem:    Patient called in wanting a copy of her recent labs faxed to Dr. Bennie Dallas at [facility]. Phone: [phone]

## 2012-09-02 NOTE — Telephone Encounter (Signed)
I spoke with the patient and made her aware that her labs from been faxed through [facility].

## 2012-09-05 ENCOUNTER — Ambulatory Visit (INDEPENDENT_AMBULATORY_CARE_PROVIDER_SITE_OTHER): Payer: BC Managed Care – PPO | Admitting: Cardiology

## 2012-09-05 ENCOUNTER — Encounter: Payer: Self-pay | Admitting: Cardiology

## 2012-09-05 VITALS — BP 118/64 | HR 76 | Ht 63.0 in | Wt 137.0 lb

## 2012-09-05 DIAGNOSIS — F341 Dysthymic disorder: Secondary | ICD-10-CM

## 2012-09-05 DIAGNOSIS — F329 Major depressive disorder, single episode, unspecified: Secondary | ICD-10-CM

## 2012-09-05 DIAGNOSIS — K219 Gastro-esophageal reflux disease without esophagitis: Secondary | ICD-10-CM

## 2012-09-05 DIAGNOSIS — E7800 Pure hypercholesterolemia, unspecified: Secondary | ICD-10-CM

## 2012-09-05 NOTE — Assessment & Plan Note (Signed)
Her symptoms of anxiety and depression improved and she is no longer taking her antidepressant and she does not intend to go back on them in the future

## 2012-09-05 NOTE — Patient Instructions (Addendum)
Your physician recommends that you continue on your current medications as directed. Please refer to the Current Medication list given to you today.  Your physician wants you to follow-up in: 6 months with fasting labs (lp/bmet/hfp)  You will receive a reminder letter in the mail two months in advance. If you don't receive a letter, please call our office to schedule the follow-up appointment.  

## 2012-09-05 NOTE — Progress Notes (Signed)
Tina [name] Date of Birth:  09-11-1950 [facility] [address] [place], [place]  [address] [phone]         Fax   [phone]  History of Present Illness: This pleasant 62 year old woman is seen for a six-month followup office visit. She has a past history of palpitations and a history of hypercholesterolemia. In the past she has also had problems with low blood pressure and orthostatic hypotension. Since we last saw her, her father is deceased and her mother is in a skilled nursing facility in [place] and has severe dementia and Parkinson's and is totally helpless. The patient goes to try to feed her her lunch each day. The patient has been experiencing some occasional palpitations. Her blood pressure has been low at times.  Since we last saw her the patient had a grandson born near [facility] and the infant does have some concerns about congenital deformities.   Current Outpatient Prescriptions  Medication Sig Dispense Refill  . acetaminophen (TYLENOL) 325 MG tablet Take 650 mg by mouth as needed.        [name] aspirin 81 MG tablet Take 81 mg by mouth daily.        [name] atorvastatin (LIPITOR) 20 MG tablet TAKE 1 TABLET BY MOUTH EVERY DAY  90 tablet  3  . Coenzyme Q10 (COQ-10 PO) Take by mouth.        . dexlansoprazole (DEXILANT) 60 MG capsule Take 60 mg by mouth daily.      . meclizine (ANTIVERT) 25 MG tablet Take 25 mg by mouth as needed.        . metoprolol (LOPRESSOR) 50 MG tablet Take 1 tablet (50 mg total) by mouth 2 (two) times daily.  180 tablet  3  . multivitamin (THERAGRAN) per tablet Take 1 tablet by mouth daily.        [name] NAPROXEN PO Take 220 mg by mouth as directed.      . Potassium Gluconate 550 MG TABS Take by mouth.      . raloxifene (EVISTA) 60 MG tablet Take 60 mg by mouth daily.         No current facility-administered medications for this visit.    Allergies  Allergen Reactions  . Codeine     nausea  . Penicillins     rash    . Simvastatin     Leg pain    Patient Active Problem List  Diagnosis  . GERD (gastroesophageal reflux disease)  . Hypercholesterolemia  . Heart palpitations  . Postural dizziness  . Anxiety and depression  . Leg pain, right    History  Smoking status  . Never Smoker   Smokeless tobacco  . Not on file    History  Alcohol Use: Not on file    Family History  Problem Relation Age of Onset  . Alzheimer's disease Mother   . Alzheimer's disease Father     Review of Systems: Constitutional: no fever chills diaphoresis or fatigue or change in weight.  Head and neck: no hearing loss, no epistaxis, no photophobia or visual disturbance. Respiratory: No cough, shortness of breath or wheezing. Cardiovascular: No chest pain peripheral edema, palpitations. Gastrointestinal: No abdominal distention, no abdominal pain, no change in bowel habits hematochezia or melena. Genitourinary: No dysuria, no frequency, no urgency, no nocturia. Musculoskeletal:No arthralgias, no back pain, no gait disturbance or myalgias. Neurological: No dizziness, no headaches, no numbness, no seizures, no syncope, no weakness, no tremors. Hematologic: No lymphadenopathy,  no easy bruising. Psychiatric: No confusion, no hallucinations, no sleep disturbance.    Physical Exam: Filed Vitals:   09/05/12 1127  BP: 118/64  Pulse: 76   the general appearance reveals a well-developed well-nourished woman in no distress.The head and neck exam reveals pupils equal and reactive.  Extraocular movements are full.  There is no scleral icterus.  The mouth and pharynx are normal.  The neck is supple.  The carotids reveal no bruits.  The jugular venous pressure is normal.  The  thyroid is not enlarged.  There is no lymphadenopathy.  The chest is clear to percussion and auscultation.  There are no rales or rhonchi.  Expansion of the chest is symmetrical.  The precordium is quiet.  The first heart sound is normal.  The second  heart sound is physiologically split.  There is no murmur gallop rub or click.  There is no abnormal lift or heave.  The abdomen is soft and nontender.  The bowel sounds are normal.  The liver and spleen are not enlarged.  There are no abdominal masses.  There are no abdominal bruits.  Extremities reveal good pedal pulses.  There is no phlebitis or edema.  There is no cyanosis or clubbing.  Strength is normal and symmetrical in all extremities.  There is no lateralizing weakness.  There are no sensory deficits.  The skin is warm and dry.  There is no rash.     Assessment / Plan: Continue same medication.  Recheck in 6 months for followup office visit lipid panel hepatic function panel and basal metabolic panel.  Her weight is down 4 pounds since last visit and she will continue on a careful low-cholesterol diet.

## 2012-09-05 NOTE — Assessment & Plan Note (Signed)
The patient remains on low-dose Lipitor 20 mg daily.  Blood work is satisfactory.  She is having occasional leg cramps at night but attributes that to not drinking enough water join the day

## 2012-09-05 NOTE — Assessment & Plan Note (Signed)
The patient has not been having any active GI symptoms.  She has a prior history of a Nissen fundoplication for hiatal hernia and she does have mesh in her diaphragm.  She notes occasional spasm in the right upper quadrant.

## 2012-10-14 ENCOUNTER — Other Ambulatory Visit: Payer: Self-pay | Admitting: *Deleted

## 2012-10-14 MED ORDER — METOPROLOL TARTRATE 50 MG PO TABS: 50.0000 mg | ORAL_TABLET | Freq: Two times a day (BID) | ORAL | Status: DC

## 2012-11-01 ENCOUNTER — Telehealth: Payer: Self-pay | Admitting: Cardiology

## 2012-11-01 NOTE — Telephone Encounter (Signed)
Rec'd from [facility] forward 23 pages to Dr.Brackbill 11/01/12 js

## 2012-12-18 ENCOUNTER — Other Ambulatory Visit: Payer: Self-pay | Admitting: *Deleted

## 2012-12-18 MED ORDER — ATORVASTATIN CALCIUM 20 MG PO TABS: ORAL_TABLET | ORAL | Status: DC

## 2013-02-17 ENCOUNTER — Other Ambulatory Visit (INDEPENDENT_AMBULATORY_CARE_PROVIDER_SITE_OTHER): Payer: BC Managed Care – PPO

## 2013-02-17 DIAGNOSIS — E7800 Pure hypercholesterolemia, unspecified: Secondary | ICD-10-CM

## 2013-02-17 LAB — HEPATIC FUNCTION PANEL
ALT: 15 U/L (ref 0–35)
Alkaline Phosphatase: 84 U/L (ref 39–117)
Bilirubin, Direct: 0 mg/dL (ref 0.0–0.3)
Total Bilirubin: 0.6 mg/dL (ref 0.3–1.2)
Total Protein: 7.2 g/dL (ref 6.0–8.3)

## 2013-02-17 LAB — LIPID PANEL: Cholesterol: 151 mg/dL (ref 0–200)

## 2013-02-17 LAB — BASIC METABOLIC PANEL
BUN: 16 mg/dL (ref 6–23)
Creatinine, Ser: 0.7 mg/dL (ref 0.4–1.2)
GFR: 90.03 mL/min (ref >60.00)
Glucose, Bld: 82 mg/dL (ref 70–99)
Potassium: 3.9 mEq/L (ref 3.5–5.1)

## 2013-02-17 NOTE — Progress Notes (Signed)
Quick Note:  Please make copy of labs for patient visit. ______ 

## 2013-02-21 ENCOUNTER — Ambulatory Visit: Payer: BC Managed Care – PPO | Admitting: Cardiology

## 2013-02-24 ENCOUNTER — Ambulatory Visit: Payer: BC Managed Care – PPO | Admitting: Cardiology

## 2013-02-26 ENCOUNTER — Ambulatory Visit (INDEPENDENT_AMBULATORY_CARE_PROVIDER_SITE_OTHER): Payer: BC Managed Care – PPO | Admitting: Cardiology

## 2013-02-26 ENCOUNTER — Encounter: Payer: Self-pay | Admitting: Cardiology

## 2013-02-26 VITALS — BP 110/72 | HR 61 | Ht 65.0 in | Wt 141.2 lb

## 2013-02-26 DIAGNOSIS — E7800 Pure hypercholesterolemia, unspecified: Secondary | ICD-10-CM

## 2013-02-26 DIAGNOSIS — F341 Dysthymic disorder: Secondary | ICD-10-CM

## 2013-02-26 DIAGNOSIS — F329 Major depressive disorder, single episode, unspecified: Secondary | ICD-10-CM

## 2013-02-26 DIAGNOSIS — R002 Palpitations: Secondary | ICD-10-CM

## 2013-02-26 NOTE — Assessment & Plan Note (Signed)
Since last visit the patient has not been having any new problems or side effects from her low dose statin therapy.  Blood work is stable and was reviewed with the patient today

## 2013-02-26 NOTE — Patient Instructions (Addendum)
Your physician recommends that you continue on your current medications as directed. Please refer to the Current Medication list given to you today.  Your physician wants you to follow-up in: 6 months with fasting labs (lp/bmet/hfp)  You will receive a reminder letter in the mail two months in advance. If you don't receive a letter, please call our office to schedule the follow-up appointment.  

## 2013-02-26 NOTE — Progress Notes (Signed)
Tina [name] Date of Birth:  1950/11/22 [facility] [address] [place], [place]  [address] [phone]         Fax   [phone]  History of Present Illness: This pleasant 62 year old woman is seen for a six-month followup office visit. She has a past history of palpitations and a history of hypercholesterolemia. In the past she has also had problems with low blood pressure and orthostatic hypotension. . The patient has been experiencing some occasional palpitations. Her blood pressure has been low at times.  Since we last saw her her grandson who was born with multiple congenital abnormalities died after 5 months.  He was in a hospital up in [facility].   Current Outpatient Prescriptions  Medication Sig Dispense Refill  . acetaminophen (TYLENOL) 325 MG tablet Take 650 mg by mouth as needed.        [name] aspirin 81 MG tablet Take 81 mg by mouth daily.        [name] atorvastatin (LIPITOR) 20 MG tablet TAKE 1 TABLET BY MOUTH EVERY DAY  90 tablet  3  . CALCIUM PO Take by mouth daily.      . Coenzyme Q10 (COQ-10 PO) Take by mouth.        . dexlansoprazole (DEXILANT) 60 MG capsule Take 60 mg by mouth daily.      . meclizine (ANTIVERT) 25 MG tablet Take 25 mg by mouth as needed.        . metoprolol (LOPRESSOR) 50 MG tablet Take 1 tablet (50 mg total) by mouth 2 (two) times daily.  180 tablet  3  . multivitamin (THERAGRAN) per tablet Take 1 tablet by mouth daily.        [name] NAPROXEN PO Take 220 mg by mouth as directed.      . Potassium Gluconate 550 MG TABS Take by mouth.       No current facility-administered medications for this visit.    Allergies  Allergen Reactions  . Codeine     nausea  . Penicillins     rash  . Simvastatin     Leg pain    Patient Active Problem List   Diagnosis Date Noted  . Postural dizziness 02/01/2011    Priority: High  . Anxiety and depression 02/01/2011    Priority: High  . Leg pain, right 03/20/2012  . Heart  palpitations 10/13/2010  . GERD (gastroesophageal reflux disease)   . Hypercholesterolemia     History  Smoking status  . Never Smoker   Smokeless tobacco  . Not on file    History  Alcohol Use: Not on file    Family History  Problem Relation Age of Onset  . Alzheimer's disease Mother   . Alzheimer's disease Father     Review of Systems: Constitutional: no fever chills diaphoresis or fatigue or change in weight.  Head and neck: no hearing loss, no epistaxis, no photophobia or visual disturbance. Respiratory: No cough, shortness of breath or wheezing. Cardiovascular: No chest pain peripheral edema, palpitations. Gastrointestinal: No abdominal distention, no abdominal pain, no change in bowel habits hematochezia or melena. Genitourinary: No dysuria, no frequency, no urgency, no nocturia. Musculoskeletal:No arthralgias, no back pain, no gait disturbance or myalgias. Neurological: No dizziness, no headaches, no numbness, no seizures, no syncope, no weakness, no tremors. Hematologic: No lymphadenopathy, no easy bruising. Psychiatric: No confusion, no hallucinations, no sleep disturbance.    Physical Exam: Filed Vitals:   02/26/13 0958  BP: 110/72  Pulse: 61   the general appearance reveals a well-developed well-nourished middle-aged woman in no distress.The head and neck exam reveals pupils equal and reactive.  Extraocular movements are full.  There is no scleral icterus.  The mouth and pharynx are normal.  The neck is supple.  There is a scar in her left neck from previous cervical disc disease surgery  The carotids reveal no bruits.  The jugular venous pressure is normal.  The  thyroid is not enlarged.  There is no lymphadenopathy.  The chest is clear to percussion and auscultation.  There are no rales or rhonchi.  Expansion of the chest is symmetrical.  The precordium is quiet.  The first heart sound is normal.  The second heart sound is physiologically split.  There is no  murmur gallop rub or click.  There is no abnormal lift or heave.  The abdomen is soft and nontender.  The bowel sounds are normal.  The liver and spleen are not enlarged.  There are no abdominal masses.  Multiple abdominal scars from 2 prior operations for hiatal hernia as well as a right inguinal hernia.  There are no abdominal bruits.  Extremities reveal good pedal pulses.  There is no phlebitis or edema.  There is no cyanosis or clubbing.  Strength is normal and symmetrical in all extremities.  There is no lateralizing weakness.  There are no sensory deficits.  The skin is warm and dry.  There is no rash.  EKG shows normal sinus rhythm and is within normal limits.   Assessment / Plan: Continue on same medication.  Recheck in 6 months for office visit fasting lipid panel hepatic function panel and basal metabolic panel. She has a new gynecologist who recently treated her for a bladder infection.  Her new PCP will be Dr. Felicity Coyer.

## 2013-02-26 NOTE — Assessment & Plan Note (Signed)
Generally her palpitations are improved.  She had one episode on July 2 which was more prolonged and severe and she took extra beta blocker with relief

## 2013-02-26 NOTE — Assessment & Plan Note (Signed)
Her symptoms of anxiety and depression appear to be slightly improved.  However she still awakens sometimes in the middle of the night and does not know where she is right away and this concerns her.  Some of this may be related to old distress she has been under regarding her elderly parents and her deceased grandson over the past year

## 2013-03-06 ENCOUNTER — Ambulatory Visit (INDEPENDENT_AMBULATORY_CARE_PROVIDER_SITE_OTHER): Payer: BC Managed Care – PPO | Admitting: Internal Medicine

## 2013-03-06 ENCOUNTER — Encounter: Payer: Self-pay | Admitting: Internal Medicine

## 2013-03-06 VITALS — BP 110/70 | HR 74 | Temp 98.2°F | Ht 65.0 in | Wt 141.0 lb

## 2013-03-06 DIAGNOSIS — K219 Gastro-esophageal reflux disease without esophagitis: Secondary | ICD-10-CM

## 2013-03-06 DIAGNOSIS — E7800 Pure hypercholesterolemia, unspecified: Secondary | ICD-10-CM

## 2013-03-06 DIAGNOSIS — M899 Disorder of bone, unspecified: Secondary | ICD-10-CM

## 2013-03-06 DIAGNOSIS — F329 Major depressive disorder, single episode, unspecified: Secondary | ICD-10-CM

## 2013-03-06 DIAGNOSIS — M810 Age-related osteoporosis without current pathological fracture: Secondary | ICD-10-CM | POA: Insufficient documentation

## 2013-03-06 DIAGNOSIS — F341 Dysthymic disorder: Secondary | ICD-10-CM

## 2013-03-06 DIAGNOSIS — M8580 Other specified disorders of bone density and structure, unspecified site: Secondary | ICD-10-CM

## 2013-03-06 NOTE — Assessment & Plan Note (Signed)
Hx accidental RLE tib fx 03/2012 with fall On Ca+D, works with sports med for same Due for DEXA now - will schedule, consider bisphos or Prolia if needed

## 2013-03-06 NOTE — Assessment & Plan Note (Signed)
On statin Well controlled last check, monitor annually The current medical regimen is effective;  continue present plan and medications.  

## 2013-03-06 NOTE — Assessment & Plan Note (Signed)
largely situational related to aging parents and death of 74mo g-son 2013/01/04 (congenital/neuro issues) On prozac 2005-2008, but doing well without meds at this time Emotional support offered - pt will call if problems

## 2013-03-06 NOTE — Assessment & Plan Note (Signed)
History of Nissen fundoplication 2004, redo 2009 because of persisting symptoms. Right sided mesh repair diaphragm during 2009 redo Symptoms currently well-controlled with ongoing PPI Follow up with GI as needed, no changes recommended

## 2013-03-06 NOTE — Patient Instructions (Signed)
It was good to see you today. We have reviewed your prior records including labs and tests today Medications reviewed and updated, no changes recommended at this time. DEXA scan for bone density ordered today. Your results will be released to MyChart (or called to you) after review, usually within 72hours after test completion. If any changes need to be made, you will be notified at that same time. we will send to your prior provider(s) for "release of records" as discussed today. Please schedule followup in 6-12 months, call sooner if problems.

## 2013-03-06 NOTE — Progress Notes (Signed)
Subjective:    Patient ID: Tina [name], female    DOB: 1951/06/16, 62 y.o.   MRN: [id number]  HPI  New patient to me and our practice, here to establish primary care provider Transfer from PCP at [facility], known to Dr. Patty Sermons ( cardiology)  Reviewed chronic medical issues today:  Osteopenia. Ongoing treatment by sports medicine specialist Dr. Althea Charon. History of leg fracture (right tibia) following accidental trauma/fall September 2013. Ongoing weight bearing exercises, oral calcium with vitamin D supplementation  Dyslipidemia. On statin -the patient reports compliance with medication(s) as prescribed. Denies adverse side effects.  GERD. History of Nissen fundoplication related to same. On PPI. the patient reports compliance with medication(s) as prescribed. Denies adverse side effects.  History of anxiety and depression. Not currently on medication to control same, on prozac from 2005-2008. Reports symptoms stable and well controlled at this time despite situational stressors  Past Medical History  Diagnosis Date  . Palpitations   . Hypercholesterolemia   . GERD (gastroesophageal reflux disease)   . Anxiety and depression   . Migraines   . Osteopenia     hx hx 03/2012 following fall   Family History  Problem Relation Age of Onset  . Alzheimer's disease Mother   . Alzheimer's disease Father   . Prostate cancer Father   . Hyperlipidemia Father   . Heart disease Father   . Stroke Paternal Grandmother   . Hypertension Other   . Congestive Heart Failure Father    History  Substance Use Topics  . Smoking status: Never Smoker   . Smokeless tobacco: Not on file  . Alcohol Use: No     Review of Systems Constitutional: Negative for fever or weight change.  Respiratory: Negative for cough and shortness of breath.   Cardiovascular: Negative for chest pain or palpitations.  Gastrointestinal: Negative for abdominal pain, no bowel changes.  Musculoskeletal: Negative for gait  problem or joint swelling.  Skin: Negative for rash.  Neurological: Negative for dizziness or headache.  No other specific complaints in a complete review of systems (except as listed in HPI above).     Objective:   Physical Exam BP 110/70  Pulse 74  Temp(Src) 98.2 F (36.8 C) (Oral)  Ht 5\' 5"  (1.651 m)  Wt 141 lb (63.957 kg)  BMI 23.46 kg/m2  SpO2 96% Wt Readings from Last 3 Encounters:  03/06/13 141 lb (63.957 kg)  02/26/13 141 lb 3.2 oz (64.048 kg)  09/05/12 137 lb (62.143 kg)   Constitutional: She appears well-developed and well-nourished. No distress.  HENT: Head: Normocephalic and atraumatic. Ears: B TMs ok, no erythema or effusion; Nose: Nose normal. Mouth/Throat: Oropharynx is clear and moist. No oropharyngeal exudate.  Eyes: Conjunctivae and EOM are normal. Pupils are equal, round, and reactive to light. No scleral icterus.  Neck: Normal range of motion. Neck supple. No JVD present. No thyromegaly present.  Cardiovascular: Normal rate, regular rhythm and normal heart sounds.  No murmur heard. No BLE edema. Pulmonary/Chest: Effort normal and breath sounds normal. No respiratory distress. She has no wheezes.  Abdominal: Soft. Bowel sounds are normal. She exhibits no distension. There is no tenderness. no masses Musculoskeletal: Normal range of motion, no joint effusions. No gross deformities Neurological: She is alert and oriented to person, place, and time. No cranial nerve deficit. Coordination, balance, strength, speech and gait are normal.  Skin: Skin is warm and dry. No rash noted. No erythema.  Psychiatric: She has a normal mood and affect. Her behavior is  normal. Judgment and thought content normal.   Lab Results  Component Value Date   WBC 8.3 08/16/2007   HGB 12.5 08/16/2007   HCT 35.7* 08/16/2007   PLT 219 08/16/2007   GLUCOSE 82 02/17/2013   CHOL 151 02/17/2013   TRIG 133.0 02/17/2013   HDL 38.40* 02/17/2013   LDLCALC 86 02/17/2013   ALT 15 02/17/2013   AST 20 02/17/2013    NA 140 02/17/2013   K 3.9 02/17/2013   CL 105 02/17/2013   CREATININE 0.7 02/17/2013   BUN 16 02/17/2013   CO2 29 02/17/2013         Assessment & Plan:   See problem list. Medications and labs reviewed today.  Time spent with pt today 45 minutes, greater than 50% time spent counseling patient on GERD, hx of depression, osteopenia and medication review. Also review of prior records/ROI request

## 2013-03-13 ENCOUNTER — Ambulatory Visit (INDEPENDENT_AMBULATORY_CARE_PROVIDER_SITE_OTHER)
Admission: RE | Admit: 2013-03-13 | Discharge: 2013-03-13 | Disposition: A | Discharge status: Home / Self Care | Payer: BC Managed Care – PPO | Source: Ambulatory Visit | Attending: Internal Medicine | Admitting: Internal Medicine

## 2013-03-13 DIAGNOSIS — M899 Disorder of bone, unspecified: Secondary | ICD-10-CM

## 2013-03-13 DIAGNOSIS — M8580 Other specified disorders of bone density and structure, unspecified site: Secondary | ICD-10-CM

## 2013-03-18 ENCOUNTER — Encounter: Payer: Self-pay | Admitting: Internal Medicine

## 2013-03-20 ENCOUNTER — Other Ambulatory Visit: Payer: Self-pay | Admitting: *Deleted

## 2013-03-20 MED ORDER — IBANDRONATE SODIUM 150 MG PO TABS: 150.0000 mg | ORAL_TABLET | ORAL | Status: DC

## 2013-03-20 NOTE — Telephone Encounter (Signed)
called pt concerning her bone density results. Pt agreed to start boniva sending rx to [facility] in [place]...Raechel Chute

## 2013-04-07 ENCOUNTER — Telehealth: Payer: Self-pay | Admitting: *Deleted

## 2013-04-07 MED ORDER — RISEDRONATE SODIUM 35 MG PO TABS: 35.0000 mg | ORAL_TABLET | ORAL | Status: DC

## 2013-04-07 NOTE — Telephone Encounter (Signed)
Pt called to report the Boniva is causing nausea.  Please advise

## 2013-04-07 NOTE — Telephone Encounter (Signed)
Stop Boniva Start shorter acting Actonel 1 tablet every 7 days - next dose should be 30 days after taking 1st dose of Boniva E-rx actonel done

## 2013-04-08 NOTE — Telephone Encounter (Signed)
Spoke with pt advised of MDs message 

## 2013-04-24 ENCOUNTER — Other Ambulatory Visit: Payer: Self-pay | Admitting: *Deleted

## 2013-04-24 NOTE — Telephone Encounter (Signed)
Received fax pt needing PA on her Actonel. Completed PA on covermymeds. Waiting on approval status...Raechel Chute

## 2013-04-28 NOTE — Telephone Encounter (Signed)
Received PA back med has been approved. Notified pharmacy with approval status.../lmb 

## 2013-05-08 ENCOUNTER — Encounter: Payer: Self-pay | Admitting: Internal Medicine

## 2013-07-14 ENCOUNTER — Ambulatory Visit (INDEPENDENT_AMBULATORY_CARE_PROVIDER_SITE_OTHER): Payer: BC Managed Care – PPO | Admitting: Internal Medicine

## 2013-07-14 ENCOUNTER — Encounter: Payer: Self-pay | Admitting: Internal Medicine

## 2013-07-14 VITALS — BP 110/70 | HR 128 | Temp 99.6°F | Wt 142.0 lb

## 2013-07-14 DIAGNOSIS — J029 Acute pharyngitis, unspecified: Secondary | ICD-10-CM

## 2013-07-14 DIAGNOSIS — Z20818 Contact with and (suspected) exposure to other bacterial communicable diseases: Secondary | ICD-10-CM

## 2013-07-14 DIAGNOSIS — Z2089 Contact with and (suspected) exposure to other communicable diseases: Secondary | ICD-10-CM

## 2013-07-14 DIAGNOSIS — R509 Fever, unspecified: Secondary | ICD-10-CM

## 2013-07-14 DIAGNOSIS — R112 Nausea with vomiting, unspecified: Secondary | ICD-10-CM

## 2013-07-14 MED ORDER — CEPHALEXIN 500 MG PO CAPS: 500.0000 mg | ORAL_CAPSULE | Freq: Two times a day (BID) | ORAL | Status: DC

## 2013-07-14 MED ORDER — PROMETHAZINE HCL 25 MG/ML IJ SOLN
25.0000 mg | Freq: Once | INTRAMUSCULAR | Status: AC
  Administered 2013-07-14: 25 mg via INTRAMUSCULAR

## 2013-07-14 MED ORDER — ONDANSETRON 4 MG PO TBDP: 4.0000 mg | ORAL_TABLET | Freq: Three times a day (TID) | ORAL | Status: DC | PRN

## 2013-07-14 NOTE — Patient Instructions (Addendum)
This could be strep throat. Even through the initial rapid test is negative  clulture test is  Pending can take 2 days or so  Because you are allergic to PCN will give keflex antibiotic and antinausea medication i think the benefit  Of th antibiotic is more than any risk of side effect .  Small liquids  Frequently to avoid worsening.dehydration Gatorade clear broth . Stop all meds except the metoprolol and dexilant  And antibiotic for now until better .  Expect improvement  In the next  48 hours no matter which infection tjhis is . Fu if not a lot better in 2 days or as needed

## 2013-07-14 NOTE — Progress Notes (Signed)
Pre visit review using our clinic review tool, if applicable. No additional management support is needed unless otherwise documented below in the visit note. Chief Complaint  Patient presents with  . Sore Throat    Started last night  . Nausea  . Generalized Body Aches    HPI: Patient comes in today for SDA for  new problem evaluation. PCP NA  Here with husband sudden onset  Of last pm sore throat and fever an vomiting    No coughing  Very bad sore throat  Stomach ok but very nauseated  .  No liquids.   Children some had colds  And  Son  Age 47 had strep 2 weeks ago.  Had been on med for 4-5 days then.  Had flu vaccine  This season  pcn with rash.   pcn  Mom passed from heart and alzheimers  In past months   grans son 5 months from cva in summer  ROS: See pertinent positives and negatives per HPI.no cp sob new rash syncope  uti sx . On meds for palpaitations  Unable to take meds today All to pcn  Rash not anaphylaxis  Cannot take sulfa also?  Past Medical History  Diagnosis Date  . Palpitations   . Hypercholesterolemia   . GERD (gastroesophageal reflux disease)   . Anxiety and depression   . Migraines   . Osteopenia     RLE tib fx 03/2012 following fall    Family History  Problem Relation Age of Onset  . Alzheimer's disease Mother   . Alzheimer's disease Father   . Prostate cancer Father   . Hyperlipidemia Father   . Heart disease Father   . Stroke Paternal Grandmother   . Hypertension Other   . Congestive Heart Failure Father     History   Social History  . Marital Status: Married    Spouse Name: N/A    Number of Children: N/A  . Years of Education: N/A   Social History Main Topics  . Smoking status: Never Smoker   . Smokeless tobacco: None  . Alcohol Use: No  . Drug Use: No  . Sexual Activity: None   Other Topics Concern  . None   Social History Narrative   Retired Geophysicist/field seismologist   Master's degree educatation   Married, lives with spouse  -    Outpatient Encounter Prescriptions as of 07/14/2013  Medication Sig  . aspirin 81 MG tablet Take 81 mg by mouth daily.    [name] atorvastatin (LIPITOR) 20 MG tablet TAKE 1 TABLET BY MOUTH EVERY DAY  . CALCIUM PO Take by mouth daily.  . Coenzyme Q10 (COQ-10 PO) Take by mouth.    . dexlansoprazole (DEXILANT) 60 MG capsule Take 60 mg by mouth daily.  . meclizine (ANTIVERT) 25 MG tablet Take 25 mg by mouth as needed.    . metoprolol (LOPRESSOR) 50 MG tablet Take 1 tablet (50 mg total) by mouth 2 (two) times daily.  . multivitamin (THERAGRAN) per tablet Take 1 tablet by mouth daily.    [name] NAPROXEN PO Take 220 mg by mouth as directed.  . Potassium Gluconate 550 MG TABS Take by mouth.  . risedronate (ACTONEL) 35 MG tablet Take 1 tablet (35 mg total) by mouth every 7 (seven) days. with water on empty stomach, nothing by mouth or lie down for next 30 minutes.  [name] acetaminophen (TYLENOL) 325 MG tablet Take 650 mg by mouth as needed.    . cephALEXin (  KEFLEX) 500 MG capsule Take 1 capsule (500 mg total) by mouth 2 (two) times daily.  . ondansetron (ZOFRAN-ODT) 4 MG disintegrating tablet Take 1 tablet (4 mg total) by mouth every 8 (eight) hours as needed for nausea or vomiting.  . [EXPIRED] promethazine (PHENERGAN) injection 25 mg     EXAM:  BP 110/70  Pulse 128  Temp(Src) 99.6 F (37.6 C) (Oral)  Wt 142 lb (64.411 kg)  SpO2 97%  Body mass index is 23.63 kg/(m^2).  GENERAL: vitals reviewed and listed above, alert, oriented, appears well hydrated  Sick non toxic moderately ill  Laying down .   HEENT: atraumatic, conjunctiva  clear, no obvious abnormalities on inspection of external nose and ears tms nl OP fiery red post pharynx with white streaks bilaterally  And  Mild edema  NECK: no obvious masses on inspection palpation supple tender ac nodes neg pc  LUNGS: clear to auscultation bilaterally, no wheezes, rales or rhonchi, good air movement CV: HRRR, no clubbing cyanosis or  peripheral edema  nl cap refill  Abdomen:  Sof,t normal bowel sounds without hepatosplenomegaly, no guarding rebound or masses no CVA tenderness mildy sore  MS: moves all extremities without noticeable focal  abnormality Skin: normal capillary refill ,turgor , color: No acute rashes ,petechiae or bruising PSYCH: pleasant and cooperative, no obvious depression or anxiety  ASSESSMENT AND PLAN:  Discussed the following assessment and plan:  Exudative pharyngitis - Plan: POC Rapid Strep A, Culture, Group A Strep  Fever, unspecified - Plan: POC Rapid Strep A, Culture, Group A Strep  Exposure to Streptococcal pharyngitis - Plan: POC Rapid Strep A, Culture, Group A Strep  Nausea with vomiting - Plan: promethazine (PHENERGAN) injection 25 mg Exam consistent with strep infection despite negative rapid test to backup culture exposure did not seem intense but without cough or congestion would begin medication pending culture. Expectant management steps to hydration.   Follow but not significantly better or as needed for alarm features. -Patient advised to return or notify health care team  if symptoms worsen or persist or new concerns arise.  Patient Instructions  This could be strep throat. Even through the initial rapid test is negative  clulture test is  Pending can take 2 days or so  Because you are allergic to PCN will give keflex antibiotic and antinausea medication i think the benefit  Of th antibiotic is more than any risk of side effect .  Small liquids  Frequently to avoid worsening.dehydration Gatorade clear broth . Stop all meds except the metoprolol and dexilant  And antibiotic for now until better .  Expect improvement  In the next  48 hours no matter which infection tjhis is . Fu if not a lot better in 2 days or as needed     Burna Mortimer K. Panosh M.D.

## 2013-07-16 ENCOUNTER — Telehealth: Payer: Self-pay | Admitting: Internal Medicine

## 2013-07-16 MED ORDER — FLUCONAZOLE 100 MG PO TABS: 100.0000 mg | ORAL_TABLET | Freq: Every day | ORAL | Status: DC

## 2013-07-16 NOTE — Telephone Encounter (Signed)
Try diflucan 

## 2013-07-16 NOTE — Telephone Encounter (Signed)
Pt saw Dr. Fabian Sharp Monday since we were booked up.  She was put on Keflex.  She now has a white coating on her tongue.  Should she keep taking the Keflex?  Can something be called in for the tongue?   Please advise. Pharmacy is [facility] in [place] [phone] is the phone number.

## 2013-07-16 NOTE — Telephone Encounter (Signed)
Pt is aware.  

## 2013-07-18 ENCOUNTER — Encounter: Payer: Self-pay | Admitting: Internal Medicine

## 2013-08-22 ENCOUNTER — Other Ambulatory Visit (INDEPENDENT_AMBULATORY_CARE_PROVIDER_SITE_OTHER): Payer: BC Managed Care – PPO

## 2013-08-22 DIAGNOSIS — E7800 Pure hypercholesterolemia, unspecified: Secondary | ICD-10-CM

## 2013-08-22 LAB — HEPATIC FUNCTION PANEL
ALK PHOS: 81 U/L (ref 39–117)
ALT: 17 U/L (ref 0–35)
AST: 26 U/L (ref 0–37)
Albumin: 4.8 g/dL (ref 3.5–5.2)
BILIRUBIN DIRECT: 0 mg/dL (ref 0.0–0.3)
TOTAL PROTEIN: 8.7 g/dL — AB (ref 6.0–8.3)
Total Bilirubin: 0.9 mg/dL (ref 0.3–1.2)

## 2013-08-22 LAB — BASIC METABOLIC PANEL
BUN: 16 mg/dL (ref 6–23)
CALCIUM: 9.4 mg/dL (ref 8.4–10.5)
CO2: 28 mEq/L (ref 19–32)
CREATININE: 0.7 mg/dL (ref 0.4–1.2)
Chloride: 106 mEq/L (ref 96–112)
GFR: 91.39 mL/min (ref >60.00)
Glucose, Bld: 95 mg/dL (ref 70–99)
Potassium: 3.8 mEq/L (ref 3.5–5.1)
Sodium: 142 mEq/L (ref 135–145)

## 2013-08-25 LAB — LIPID PANEL
CHOLESTEROL: 181 mg/dL (ref 0–200)
HDL: 42.7 mg/dL (ref >39.00)
LDL Cholesterol: 110 mg/dL — ABNORMAL HIGH (ref 0–99)
TRIGLYCERIDES: 144 mg/dL (ref 0.0–149.0)
Total CHOL/HDL Ratio: 4
VLDL: 28.8 mg/dL (ref 0.0–40.0)

## 2013-08-25 NOTE — Progress Notes (Signed)
Quick Note:  Please make copy of labs for patient visit. ______ 

## 2013-08-27 ENCOUNTER — Ambulatory Visit (INDEPENDENT_AMBULATORY_CARE_PROVIDER_SITE_OTHER): Payer: BC Managed Care – PPO | Admitting: Cardiology

## 2013-08-27 ENCOUNTER — Ambulatory Visit
Admission: RE | Admit: 2013-08-27 | Discharge: 2013-08-27 | Disposition: A | Discharge status: Home / Self Care | Payer: BC Managed Care – PPO | Source: Ambulatory Visit | Attending: Cardiology | Admitting: Cardiology

## 2013-08-27 ENCOUNTER — Encounter: Payer: Self-pay | Admitting: Cardiology

## 2013-08-27 VITALS — BP 104/58 | HR 70 | Ht 65.0 in | Wt 139.0 lb

## 2013-08-27 DIAGNOSIS — R002 Palpitations: Secondary | ICD-10-CM

## 2013-08-27 DIAGNOSIS — E7800 Pure hypercholesterolemia, unspecified: Secondary | ICD-10-CM

## 2013-08-27 DIAGNOSIS — K219 Gastro-esophageal reflux disease without esophagitis: Secondary | ICD-10-CM

## 2013-08-27 NOTE — Assessment & Plan Note (Signed)
The patient has a history of GERD.  She is on Dexilant.  She has an occasional cough she attributes to GERD

## 2013-08-27 NOTE — Patient Instructions (Addendum)
Your physician recommends that you continue on your current medications as directed. Please refer to the Current Medication list given to you today.  A chest x-ray takes a picture of the organs and structures inside the chest, including the heart, lungs, and blood vessels. This test can show several things, including, whether the heart is enlarges; whether fluid is building up in the lungs; and whether pacemaker / defibrillator leads are still in place. [place]   Your physician wants you to follow-up in: 6 months with fasting labs (lp/bmet/hfp)  You will receive a reminder letter in the mail two months in advance. If you don't receive a letter, please call our office to schedule the follow-up appointment.

## 2013-08-27 NOTE — Assessment & Plan Note (Signed)
The patient will occasionally awaken from sleep with her heart pounding forcibly.  She is not sure whether it is pounding fast or just forceful.  She is more likely to experience dyspnea she sleeps on her left side. She exercises regularly at the gym and also at home.  She uses exercise bicycle and elliptical and treadmill and is not having any exertional symptoms.  Her last EKG in August 2014 was normal.

## 2013-08-27 NOTE — Assessment & Plan Note (Signed)
Her lipids are higher this time.  Talked about changes in her diet.  She will try to cut down on cheese and dairy products.  She does not eat much red meat.  Continue same statin.

## 2013-08-27 NOTE — Progress Notes (Signed)
Tina [name] Date of Birth:  14-Mar-1951  [address] [place], [place]  [address] [phone]         Fax   [phone]  History of Present Illness: This pleasant 63 year old woman is seen for a six-month followup office visit. She has a past history of palpitations and a history of hypercholesterolemia. In the past she has also had problems with low blood pressure and orthostatic hypotension. . The patient has been experiencing some occasional palpitations. Her blood pressure has been low at times.  Her last echocardiogram on 01/06/08 and showed normal ejection fraction of 55-60% and mild mitral regurgitation.  Her last chest x-ray in 2009 showed borderline cardiomegaly. She and her husband have 2 children.  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   Current Outpatient Prescriptions  Medication Sig Dispense Refill  . acetaminophen (TYLENOL) 325 MG tablet Take 650 mg by mouth as needed.        [name] aspirin 81 MG tablet Take 81 mg by mouth daily.        [name] atorvastatin (LIPITOR) 20 MG tablet TAKE 1 TABLET BY MOUTH EVERY DAY  90 tablet  3  . Coenzyme Q10 (COQ-10 PO) Take by mouth.        . dexlansoprazole (DEXILANT) 60 MG capsule Take 60 mg by mouth daily.      . meclizine (ANTIVERT) 25 MG tablet Take 25 mg by mouth as needed.        . metoprolol (LOPRESSOR) 50 MG tablet Take 1 tablet (50 mg total) by mouth 2 (two) times daily.  180 tablet  3  . multivitamin (THERAGRAN) per tablet Take 1 tablet by mouth daily.        [name] NAPROXEN PO Take 220 mg by mouth as directed.      . ondansetron (ZOFRAN-ODT) 4 MG disintegrating tablet Take 1 tablet (4 mg total) by mouth every 8 (eight) hours as needed for nausea or vomiting.  20 tablet  0  . risedronate (ACTONEL) 35 MG tablet Take 1 tablet (35 mg total) by mouth  every 7 (seven) days. with water on empty stomach, nothing by mouth or lie down for next 30 minutes.  4 tablet  5   No current facility-administered medications for this visit.    Allergies  Allergen Reactions  . Codeine     nausea  . Penicillins     rash  . Simvastatin     Leg pain    Patient Active Problem List   Diagnosis Date Noted  . Postural dizziness 02/01/2011    Priority: High  . Anxiety and depression 02/01/2011    Priority: High  . Osteoporosis, post-menopausal   . Leg pain, right 03/20/2012  . Heart palpitations 10/13/2010  . GERD (gastroesophageal reflux disease)   . Hypercholesterolemia     History  Smoking status  . Never Smoker   Smokeless tobacco  . Not on file    History  Alcohol Use No    Family History  Problem Relation Age of Onset  . Alzheimer's disease Mother   . Alzheimer's disease Father   . Prostate cancer Father   . Hyperlipidemia Father   . Heart disease Father   . Stroke Paternal Grandmother   . Hypertension Other   . Congestive Heart  Failure Father     Review of Systems: Constitutional: no fever chills diaphoresis or fatigue or change in weight.  Head and neck: no hearing loss, no epistaxis, no photophobia or visual disturbance. Respiratory: No cough, shortness of breath or wheezing. Cardiovascular: No chest pain peripheral edema, palpitations. Gastrointestinal: No abdominal distention, no abdominal pain, no change in bowel habits hematochezia or melena. Genitourinary: No dysuria, no frequency, no urgency, no nocturia. Musculoskeletal:No arthralgias, no back pain, no gait disturbance or myalgias. Neurological: No dizziness, no headaches, no numbness, no seizures, no syncope, no weakness, no tremors. Hematologic: No lymphadenopathy, no easy bruising. Psychiatric: No confusion, no hallucinations, no sleep disturbance.    Physical Exam: Filed Vitals:   08/27/13 1039  BP: 104/58  Pulse: 70   the general appearance reveals  a well-developed well-nourished middle-aged woman in no distress.The head and neck exam reveals pupils equal and reactive.  Extraocular movements are full.  There is no scleral icterus.  The mouth and pharynx are normal.  The neck is supple.  There is a scar in her left neck from previous cervical disc disease surgery  The carotids reveal no bruits.  The jugular venous pressure is normal.  The  thyroid is not enlarged.  There is no lymphadenopathy.  The chest is clear to percussion and auscultation.  There are no rales or rhonchi.  Expansion of the chest is symmetrical.  The precordium is quiet.  The first heart sound is normal.  The second heart sound is physiologically split.  There is no murmur gallop rub or click.  There is no abnormal lift or heave.  The abdomen is soft and nontender.  The bowel sounds are normal.  The liver and spleen are not enlarged.  There are no abdominal masses.  Multiple abdominal scars from 2 prior operations for hiatal hernia as well as a right inguinal hernia.  There are no abdominal bruits.  Extremities reveal good pedal pulses.  There is no phlebitis or edema.  There is no cyanosis or clubbing.  Strength is normal and symmetrical in all extremities.  There is no lateralizing weakness.  There are no sensory deficits.  The skin is warm and dry.  There is no rash.     Assessment / Plan: Continue on same medication.  We will update her chest x-ray today.  She will continue to watch her diet in regard to cholesterol.  She is also cutting back on caffeine and sugar.  Her weight is down 2 pounds since last visit.   Recheck in 6 months for office visit fasting lipid panel hepatic function panel and basal metabolic panel. She has a new gynecologist who recently treated her for a bladder infection.  Her new PCP is Dr. Asa Lente.

## 2013-09-04 ENCOUNTER — Encounter: Payer: Self-pay | Admitting: Internal Medicine

## 2013-09-04 ENCOUNTER — Telehealth: Payer: Self-pay | Admitting: *Deleted

## 2013-09-04 MED ORDER — MECLIZINE HCL 25 MG PO TABS: 25.0000 mg | ORAL_TABLET | ORAL | Status: DC | PRN

## 2013-09-04 NOTE — Telephone Encounter (Signed)
Pt sent email needing new rx for her meclizine, also sent dates of immunization.Marland KitchenJohny [name]

## 2013-09-09 ENCOUNTER — Encounter: Payer: Self-pay | Admitting: Internal Medicine

## 2013-09-10 ENCOUNTER — Encounter: Payer: Self-pay | Admitting: Internal Medicine

## 2013-09-10 ENCOUNTER — Ambulatory Visit: Payer: BC Managed Care – PPO | Admitting: Internal Medicine

## 2013-09-10 ENCOUNTER — Telehealth: Payer: Self-pay | Admitting: *Deleted

## 2013-09-10 NOTE — Telephone Encounter (Signed)
Sent email had her tetanus in 2010 and colonoscopy done 11/14/11. Will updated...Tina [name]

## 2013-09-26 ENCOUNTER — Ambulatory Visit (INDEPENDENT_AMBULATORY_CARE_PROVIDER_SITE_OTHER): Payer: BC Managed Care – PPO | Admitting: Internal Medicine

## 2013-09-26 ENCOUNTER — Encounter: Payer: Self-pay | Admitting: Internal Medicine

## 2013-09-26 VITALS — BP 102/68 | HR 67 | Temp 98.5°F | Wt 142.0 lb

## 2013-09-26 DIAGNOSIS — F329 Major depressive disorder, single episode, unspecified: Secondary | ICD-10-CM

## 2013-09-26 DIAGNOSIS — R002 Palpitations: Secondary | ICD-10-CM

## 2013-09-26 DIAGNOSIS — M810 Age-related osteoporosis without current pathological fracture: Secondary | ICD-10-CM

## 2013-09-26 DIAGNOSIS — F341 Dysthymic disorder: Secondary | ICD-10-CM

## 2013-09-26 DIAGNOSIS — F419 Anxiety disorder, unspecified: Secondary | ICD-10-CM

## 2013-09-26 DIAGNOSIS — E7800 Pure hypercholesterolemia, unspecified: Secondary | ICD-10-CM

## 2013-09-26 MED ORDER — CALCIUM CARBONATE-VITAMIN D 500-200 MG-UNIT PO TABS: 2.0000 | ORAL_TABLET | Freq: Two times a day (BID) | ORAL | Status: DC

## 2013-09-26 NOTE — Assessment & Plan Note (Signed)
Long hx of same - controlled with beta-blocker  ?orthostatic symptoms in AM with low normal BP - pt will monitor to see if symptoms correlate with BP reading No changes recommended today

## 2013-09-26 NOTE — Assessment & Plan Note (Signed)
Hx accidental RLE tib fx 03/2012 with fall On Ca+D, works with sports med for same started bisphos 03/2013 (tol actonel better than boniva due to GI upset) Reviewed recommended doses of Vit D+Ca 

## 2013-09-26 NOTE — Patient Instructions (Addendum)
It was good to see you today.  We have reviewed your prior records including labs and tests today  Medications reviewed and updated, no changes recommended at this time. Calcium 1200-1600mg /day and Vit D 1000-2000Unit/day  Please schedule followup in 6-12 months, call sooner if problems.  Osteoporosis Throughout your life, your body breaks down old bone and replaces it with new bone. As you get older, your body does not replace bone as quickly as it breaks it down. By the age of 69 years, most people begin to gradually lose bone because of the imbalance between bone loss and replacement. Some people lose more bone than others. Bone loss beyond a specified normal degree is considered osteoporosis.  Osteoporosis affects the strength and durability of your bones. The inside of the ends of your bones and your flat bones, like the bones of your pelvis, look like honeycomb, filled with tiny open spaces. As bone loss occurs, your bones become less dense. This means that the open spaces inside your bones become bigger and the walls between these spaces become thinner. This makes your bones weaker. Bones of a person with osteoporosis can become so weak that they can break (fracture) during minor accidents, such as a simple fall. CAUSES  The following factors have been associated with the development of osteoporosis:  Smoking.  Drinking more than 2 alcoholic drinks several days per week.  Long-term use of certain medicines:  Corticosteroids.  Chemotherapy medicines.  Thyroid medicines.  Antiepileptic medicines.  Gonadal hormone suppression medicine.  Immunosuppression medicine.  Being underweight.  Lack of physical activity.  Lack of exposure to the sun. This can lead to vitamin D deficiency.  Certain medical conditions:  Certain inflammatory bowel diseases, such as Crohn disease and ulcerative colitis.  Diabetes.  Hyperthyroidism.  Hyperparathyroidism. RISK FACTORS Anyone can  develop osteoporosis. However, the following factors can increase your risk of developing osteoporosis:  Gender Women are at higher risk than men.  Age Being older than 55 years increases your risk.  Ethnicity White and Asian people have an increased risk.  Weight Being extremely underweight can increase your risk of osteoporosis.  Family history of osteoporosis Having a family member who has developed osteoporosis can increase your risk. SYMPTOMS  Usually, people with osteoporosis have no symptoms.  DIAGNOSIS  Signs during a physical exam that may prompt your caregiver to suspect osteoporosis include:  Decreased height. This is usually caused by the compression of the bones that form your spine (vertebrae) because they have weakened and become fractured.  A curving or rounding of the upper back (kyphosis). To confirm signs of osteoporosis, your caregiver may request a procedure that uses 2 low-dose X-ray beams with different levels of energy to measure your bone mineral density (dual-energy X-ray absorptiometry [DXA]). Also, your caregiver may check your level of vitamin D. TREATMENT  The goal of osteoporosis treatment is to strengthen bones in order to decrease the risk of bone fractures. There are different types of medicines available to help achieve this goal. Some of these medicines work by slowing the processes of bone loss. Some medicines work by increasing bone density. Treatment also involves making sure that your levels of calcium and vitamin D are adequate. PREVENTION  There are things you can do to help prevent osteoporosis. Adequate intake of calcium and vitamin D can help you achieve optimal bone mineral density. Regular exercise can also help, especially resistance and weight-bearing activities. If you smoke, quitting smoking is an important part of osteoporosis prevention.  MAKE SURE YOU:  Understand these instructions.  Will watch your condition.  Will get help right  away if you are not doing well or get worse. FOR MORE INFORMATION www.osteo.org and [employer].ee Document Released: 04/12/2005 Document Revised: 10/28/2012 Document Reviewed: 06/17/2011 [facility] Patient Information 2014 [place], [place].

## 2013-09-26 NOTE — Progress Notes (Signed)
Subjective:    Patient ID: [name], female    DOB: [birth year], 63 y.o.   MRN: [id number]  HPI  Here for 6 mo follow up - reviewed chronic medical issues and interval medical events today:  Osteoporosis. prior treatment by sports medicine specialist Dr. Rip Harbour. History of leg fracture (right tibia) following accidental trauma/fall September 2013. Ongoing weight bearing exercises, oral calcium with vitamin D supplementation - started bisphos 03/2013 due to progressive loss on DEXA  Dyslipidemia. On statin -the patient reports compliance with medication(s) as prescribed. Denies adverse side effects.  GERD. History of Nissen fundoplication related to same. On PPI. the patient reports compliance with medication(s) as prescribed. Denies adverse side effects.  History of anxiety and depression. Not currently on medication to control same, on prozac from 2005-2008. Reports symptoms stable and well controlled at this time despite situational stressors  Past Medical History  Diagnosis Date  . Palpitations   . Hypercholesterolemia   . GERD (gastroesophageal reflux disease)   . Anxiety and depression   . Migraines   . Osteopenia     RLE tib fx 03/2012 following fall     Review of Systems  Respiratory: Negative for cough and shortness of breath.   Cardiovascular: Positive for palpitations (occ, controlled with BB). Negative for chest pain and leg swelling.  Psychiatric/Behavioral: Negative for confusion, sleep disturbance and dysphoric mood. The patient is not nervous/anxious.         Objective:   Physical Exam BP 102/68  Pulse 67  Temp(Src) 98.5 F (36.9 C) (Oral)  Wt 142 lb (64.411 kg)  SpO2 96% Wt Readings from Last 3 Encounters:  09/26/13 142 lb (64.411 kg)  08/27/13 139 lb (63.05 kg)  07/14/13 142 lb (64.411 kg)   Constitutional: She appears well-developed and well-nourished. No distress.  Neck: Normal range of motion. Neck supple. No JVD present. No thyromegaly  present.  Cardiovascular: Normal rate, regular rhythm and normal heart sounds.  No murmur heard. No BLE edema. Pulmonary/Chest: Effort normal and breath sounds normal. No respiratory distress. She has no wheezes.  Skin: Skin is warm and dry. No rash noted. No erythema.  Psychiatric: She has a normal mood and affect. Her behavior is normal. Judgment and thought content normal.   Lab Results  Component Value Date   WBC 8.3 08/16/2007   HGB 12.5 08/16/2007   HCT 35.7* 08/16/2007   PLT 219 08/16/2007   GLUCOSE 95 08/22/2013   CHOL 181 08/22/2013   TRIG 144.0 08/22/2013   HDL 42.70 08/22/2013   LDLCALC 110* 08/22/2013   ALT 17 08/22/2013   AST 26 08/22/2013   NA 142 08/22/2013   K 3.8 08/22/2013   CL 106 08/22/2013   CREATININE 0.7 08/22/2013   BUN 16 08/22/2013   CO2 28 08/22/2013         Assessment & Plan:   Problem List Items Addressed This Visit   Anxiety and depression     largely situational related to aging parents, death of mom Jun 27, 2013 and death of 89mo g-son Dec 26, 2012 (congenital/neuro issues) On prozac 2005-2008, but doing well without meds at this time Emotional support offered - pt will call if problems     Heart palpitations     Long hx of same - controlled with beta-blocker  ?orthostatic symptoms in AM with low normal BP - pt will monitor to see if symptoms correlate with BP reading No changes recommended today    Hypercholesterolemia     On statin Well controlled  last check, monitor annually The current medical regimen is effective;  continue present plan and medications.     Osteoporosis, post-menopausal - Primary     Hx accidental RLE tib fx 03/2012 with fall On Ca+D, works with sports med for same started bisphos 03/2013 (tol actonel better than boniva due to GI upset) Reviewed recommended doses of Vit D+Ca    Relevant Medications      OSCAL 500/200 D-3 500-200 MG-UNIT PO TABS     Time spent with pt today 25 minutes, greater than 50% time spent counseling patient on dosteoporosis,  palpitations, orthostatic symptoms and medication review. Also review of prior records

## 2013-09-26 NOTE — Progress Notes (Signed)
Pre visit review using our clinic review tool, if applicable. No additional management support is needed unless otherwise documented below in the visit note. 

## 2013-09-26 NOTE — Assessment & Plan Note (Signed)
largely situational related to aging parents, death of mom 06/26/2013 and death of 6mo g-son 12-25-2012 (congenital/neuro issues) On prozac 2005-2008, but doing well without meds at this time Emotional support offered - pt will call if problems

## 2013-09-26 NOTE — Assessment & Plan Note (Signed)
On statin Well controlled last check, monitor annually The current medical regimen is effective;  continue present plan and medications.  

## 2013-10-08 ENCOUNTER — Other Ambulatory Visit: Payer: Self-pay | Admitting: Cardiology

## 2013-10-21 ENCOUNTER — Ambulatory Visit (INDEPENDENT_AMBULATORY_CARE_PROVIDER_SITE_OTHER): Payer: BC Managed Care – PPO | Admitting: Internal Medicine

## 2013-10-21 ENCOUNTER — Encounter: Payer: Self-pay | Admitting: Internal Medicine

## 2013-10-21 VITALS — BP 110/80 | HR 72 | Temp 98.6°F | Wt 139.4 lb

## 2013-10-21 DIAGNOSIS — J309 Allergic rhinitis, unspecified: Secondary | ICD-10-CM

## 2013-10-21 MED ORDER — CETIRIZINE HCL 10 MG PO TABS: 10.0000 mg | ORAL_TABLET | Freq: Every day | ORAL | Status: DC

## 2013-10-21 MED ORDER — FLUTICASONE PROPIONATE 50 MCG/ACT NA SUSP: 1.0000 | Freq: Every day | NASAL | Status: DC

## 2013-10-21 MED ORDER — BENZONATATE 100 MG PO CAPS: 100.0000 mg | ORAL_CAPSULE | Freq: Three times a day (TID) | ORAL | Status: DC | PRN

## 2013-10-21 NOTE — Progress Notes (Signed)
Subjective:    Patient ID: Tina [name], female    DOB: 16-Jul-1951, 63 y.o.   MRN: [id number]  Sinusitis This is a new problem. The current episode started in the past 7 days. The problem has been gradually worsening since onset. There has been no fever. Associated symptoms include congestion, coughing (non productive), headaches, a hoarse voice, sinus pressure and sneezing. Pertinent negatives include no chills, diaphoresis, ear pain, neck pain, shortness of breath, sore throat or swollen glands. Past treatments include oral decongestants. The treatment provided no relief.   Past Medical History  Diagnosis Date  . Palpitations   . Hypercholesterolemia   . GERD (gastroesophageal reflux disease)   . Anxiety and depression   . Migraines   . Osteopenia     RLE tib fx 03/2012 following fall    Review of Systems  Constitutional: Positive for appetite change (decreased appetite). Negative for fever, chills and diaphoresis.  HENT: Positive for congestion, hoarse voice, sinus pressure and sneezing. Negative for ear pain and sore throat.   Respiratory: Positive for cough (non productive). Negative for chest tightness, shortness of breath and wheezing.   Cardiovascular: Negative for chest pain.  Gastrointestinal: Negative for nausea, vomiting, diarrhea and constipation.  Musculoskeletal: Negative for neck pain.  Allergic/Immunologic: Positive for environmental allergies.  Neurological: Positive for headaches.       Objective:   Physical Exam  Vitals reviewed. Constitutional: She is oriented to person, place, and time. She appears well-developed and well-nourished. No distress.  HENT:  Head: Normocephalic and atraumatic.  Right Ear: Hearing, tympanic membrane, external ear and ear canal normal.  Left Ear: Hearing, tympanic membrane, external ear and ear canal normal.  Nose: Mucosal edema and rhinorrhea present. Right sinus exhibits maxillary sinus tenderness and frontal sinus  tenderness. Left sinus exhibits maxillary sinus tenderness and frontal sinus tenderness.  Mouth/Throat: Uvula is midline, oropharynx is clear and moist and mucous membranes are normal.  Eyes: Conjunctivae are normal. Pupils are equal, round, and reactive to light. Right eye exhibits no discharge. Left eye exhibits no discharge.  Neck: Normal range of motion. Neck supple. No thyromegaly present.  Cardiovascular: Normal rate, regular rhythm and normal heart sounds.   No murmur heard. Pulmonary/Chest: Effort normal and breath sounds normal. No respiratory distress. She has no wheezes.  Musculoskeletal: Normal range of motion. She exhibits no edema and no tenderness.  Lymphadenopathy:    She has no cervical adenopathy.  Neurological: She is alert and oriented to person, place, and time.  Skin: Skin is warm and dry. No rash noted. She is not diaphoretic.  Psychiatric: She has a normal mood and affect. Her behavior is normal. Judgment and thought content normal.   Lab Results  Component Value Date   WBC 8.3 08/16/2007   HGB 12.5 08/16/2007   HCT 35.7* 08/16/2007   PLT 219 08/16/2007   GLUCOSE 95 08/22/2013   CHOL 181 08/22/2013   TRIG 144.0 08/22/2013   HDL 42.70 08/22/2013   LDLCALC 110* 08/22/2013   ALT 17 08/22/2013   AST 26 08/22/2013   NA 142 08/22/2013   K 3.8 08/22/2013   CL 106 08/22/2013   CREATININE 0.7 08/22/2013   BUN 16 08/22/2013   CO2 28 08/22/2013       Assessment & Plan:   Allergic rhinitis - no fevers or chills.  Advised to take Claritin and Flonase daily with Ibuprofen as needed for headache; increase oral fluids and rest. Call back if symptoms worsen or do not  improve in 1 week, or if fever develops.

## 2013-10-21 NOTE — Patient Instructions (Signed)
It was good to see you today.  We have reviewed your prior records including labs and tests today  If you develop worsening symptoms or fever, call and we can reconsider antibiotics, but it does not appear necessary to use antibiotics at this time.  Medications reviewed and updated Use Zyrtec 10mg  daily with Flonase nasal spray everyday for severe allergy symptoms, also over-the-counter allergy eyedrops as needed  Please keep scheduled followup for semiannual visit as planned, call sooner if problems.

## 2013-10-21 NOTE — Progress Notes (Signed)
Pre visit review using our clinic review tool, if applicable. No additional management support is needed unless otherwise documented below in the visit note. 

## 2013-10-22 ENCOUNTER — Encounter: Payer: Self-pay | Admitting: Internal Medicine

## 2013-11-17 ENCOUNTER — Other Ambulatory Visit: Payer: Self-pay | Admitting: Internal Medicine

## 2014-02-23 ENCOUNTER — Other Ambulatory Visit (INDEPENDENT_AMBULATORY_CARE_PROVIDER_SITE_OTHER): Payer: BC Managed Care – PPO

## 2014-02-23 DIAGNOSIS — E7800 Pure hypercholesterolemia, unspecified: Secondary | ICD-10-CM

## 2014-02-23 LAB — HEPATIC FUNCTION PANEL
ALT: 19 U/L (ref 0–35)
AST: 24 U/L (ref 0–37)
Albumin: 4 g/dL (ref 3.5–5.2)
Alkaline Phosphatase: 89 U/L (ref 39–117)
BILIRUBIN DIRECT: 0.1 mg/dL (ref 0.0–0.3)
Total Bilirubin: 0.9 mg/dL (ref 0.2–1.2)
Total Protein: 7.8 g/dL (ref 6.0–8.3)

## 2014-02-23 LAB — LIPID PANEL
CHOLESTEROL: 160 mg/dL (ref 0–200)
HDL: 40.3 mg/dL (ref >39.00)
LDL CALC: 81 mg/dL (ref 0–99)
NONHDL: 119.7
Total CHOL/HDL Ratio: 4
Triglycerides: 195 mg/dL — ABNORMAL HIGH (ref 0.0–149.0)
VLDL: 39 mg/dL (ref 0.0–40.0)

## 2014-02-23 LAB — BASIC METABOLIC PANEL
BUN: 17 mg/dL (ref 6–23)
CO2: 30 mEq/L (ref 19–32)
Calcium: 9.5 mg/dL (ref 8.4–10.5)
Chloride: 99 mEq/L (ref 96–112)
Creatinine, Ser: 0.8 mg/dL (ref 0.4–1.2)
GFR: 81.61 mL/min (ref >60.00)
Glucose, Bld: 85 mg/dL (ref 70–99)
Potassium: 3.9 mEq/L (ref 3.5–5.1)
SODIUM: 137 meq/L (ref 135–145)

## 2014-02-23 NOTE — Progress Notes (Signed)
Quick Note:  Please make copy of labs for patient visit. ______ 

## 2014-02-24 ENCOUNTER — Encounter: Payer: Self-pay | Admitting: Cardiology

## 2014-02-24 ENCOUNTER — Ambulatory Visit (INDEPENDENT_AMBULATORY_CARE_PROVIDER_SITE_OTHER): Payer: BC Managed Care – PPO | Admitting: Cardiology

## 2014-02-24 VITALS — BP 118/76 | HR 72 | Ht 65.5 in | Wt 145.0 lb

## 2014-02-24 DIAGNOSIS — E7800 Pure hypercholesterolemia, unspecified: Secondary | ICD-10-CM

## 2014-02-24 DIAGNOSIS — R002 Palpitations: Secondary | ICD-10-CM

## 2014-02-24 DIAGNOSIS — K219 Gastro-esophageal reflux disease without esophagitis: Secondary | ICD-10-CM

## 2014-02-24 NOTE — Assessment & Plan Note (Signed)
Patient will brief palpitations.  She has not had any sustained tachycardia arrhythmias since last visit. Normally she goes to a nearby gym for exercise but lately she has been busy and has not been to the gym in the past 2 weeks.

## 2014-02-24 NOTE — Patient Instructions (Signed)
Your physician recommends that you continue on your current medications as directed. Please refer to the Current Medication list given to you today.  Your physician recommends that you return for a FASTING lipid, hepatic and bmet in 6 months.   Your physician wants you to follow-up in: 6 months with Dr. Mare Ferrari. You will receive a reminder letter in the mail two months in advance. If you don't receive a letter, please call our office to schedule the follow-up appointment.

## 2014-02-24 NOTE — Assessment & Plan Note (Signed)
The patient has not been experiencing any worsening of her symptoms of GERD.  She takes an occasional Dexilant. Her gastroenterologist advised her to stop taking naproxen.  He was concerned that he would damage her stomach lining after a prolonged period of time.

## 2014-02-24 NOTE — Progress Notes (Signed)
[name] Date of Birth:  Jan 28, 1951 [facility] [address] [place], [place]  [address] [phone]        Fax   [phone]   History of Present Illness: This pleasant 63 year old woman is seen for a six-month followup office visit. She has a past history of palpitations and a history of hypercholesterolemia. In the past she has also had problems with low blood pressure and orthostatic hypotension. . The patient has been experiencing some occasional palpitations. Her blood pressure has been low at times. Her last echocardiogram on 01/06/08 and showed normal ejection fraction of 55-60% and mild mitral regurgitation. Her last chest x-ray in 2009 showed borderline cardiomegaly.  She and her husband have 2 children. 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 She and her husband are starting to think about downsizing and possibly moving to a smaller home in the [place] area.   Current Outpatient Prescriptions  Medication Sig Dispense Refill  . acetaminophen (TYLENOL) 325 MG tablet Take 650 mg by mouth as needed.        . ACTONEL 35 MG tablet TAKE 1 TABLET BY MOUTH EVERY 7 DAYS WITH WATER ON EMPTY STOMACH, THEN NO FOOD, STAY UPRIGHT 30MINUTE  4 tablet  5  . aspirin 81 MG tablet Take 81 mg by mouth daily.        [name] atorvastatin (LIPITOR) 20 MG tablet TAKE 1 TABLET BY MOUTH EVERY DAY  90 tablet  3  . calcium-vitamin D (OSCAL 500/200 D-3) 500-200 MG-UNIT per tablet Take 2 tablets by mouth 2 (two) times daily.      . cetirizine (ZYRTEC) 10 MG tablet Take 1 tablet (10 mg total) by mouth daily.  30 tablet  11  . Coenzyme Q10 (COQ-10 PO) Take by mouth.        . dexlansoprazole (DEXILANT) 60 MG capsule Take 60 mg by mouth daily.      . fluticasone (FLONASE) 50 MCG/ACT nasal spray Place 1 spray into  both nostrils daily.  16 g  2  . meclizine (ANTIVERT) 25 MG tablet Take 1 tablet (25 mg total) by mouth as needed.  30 tablet  0  . metoprolol (LOPRESSOR) 50 MG tablet TAKE 1 TABLET BY MOUTH 2 TIMES DAILY.  180 tablet  2  . multivitamin (THERAGRAN) per tablet Take 1 tablet by mouth daily.         No current facility-administered medications for this visit.    Allergies  Allergen Reactions  . Codeine     nausea  . Penicillins     rash  . Simvastatin     Leg pain    Patient Active Problem List   Diagnosis Date Noted  . Postural dizziness 02/01/2011    Priority: High  . Anxiety and depression 02/01/2011    Priority: High  . Osteoporosis, post-menopausal   . Leg pain, right 03/20/2012  . Heart palpitations 10/13/2010  . GERD (gastroesophageal reflux disease)   . Hypercholesterolemia     History  Smoking status  . Never Smoker   Smokeless tobacco  . Not on file    History  Alcohol Use No    Family History  Problem Relation Age of Onset  . Alzheimer's disease Mother   . Alzheimer's disease Father   .  Prostate cancer Father   . Hyperlipidemia Father   . Heart disease Father   . Stroke Paternal Grandmother   . Hypertension Other   . Congestive Heart Failure Father     Review of Systems: Constitutional: no fever chills diaphoresis or fatigue or change in weight.  Head and neck: no hearing loss, no epistaxis, no photophobia or visual disturbance. Respiratory: No cough, shortness of breath or wheezing. Cardiovascular: No chest pain peripheral edema, palpitations. Gastrointestinal: No abdominal distention, no abdominal pain, no change in bowel habits hematochezia or melena. Genitourinary: No dysuria, no frequency, no urgency, no nocturia. Musculoskeletal:No arthralgias, no back pain, no gait disturbance or myalgias. Neurological: No dizziness, no headaches, no numbness, no seizures, no syncope, no weakness, no tremors. Hematologic: No lymphadenopathy, no easy  bruising. Psychiatric: No confusion, no hallucinations, no sleep disturbance.    Physical Exam: Filed Vitals:   02/24/14 1150  BP: 118/76  Pulse: 72   general appearance reveals a well-developed well-nourished middle-aged woman in no distress.The head and neck exam reveals pupils equal and reactive.  Extraocular movements are full.  There is no scleral icterus.  The mouth and pharynx are normal.  The neck is supple.  The carotids reveal no bruits.  The jugular venous pressure is normal.  The  thyroid is not enlarged.  There is no lymphadenopathy.  The chest is clear to percussion and auscultation.  There are no rales or rhonchi.  Expansion of the chest is symmetrical.  The precordium is quiet.  The first heart sound is normal.  The second heart sound is physiologically split.  There is no murmur gallop rub or click.  There is no abnormal lift or heave.  The abdomen is soft and nontender.  The bowel sounds are normal.  The liver and spleen are not enlarged.  There are no abdominal masses.  There are no abdominal bruits.  Extremities reveal good pedal pulses.  There is no phlebitis or edema.  There is no cyanosis or clubbing.  Strength is normal and symmetrical in all extremities.  There is no lateralizing weakness.  There are no sensory deficits.  The skin is warm and dry.  There is no rash.  EKG today shows normal sinus rhythm and is within normal limits.   Assessment / Plan: 1. history of palpitations 2. Hypercholesterolemia 3. GERD 4.  Osteoarthritis 5. history of nocturnal leg cramps  Plan: Continue same medication.  Continue to drink plenty of water to prevent nocturnal leg cramps.  We also talked about other "home remedies" including eating a spoon of yellow mustard at bedtime etc. The patient is to be rechecked in 6 months for office visit lipid panel hepatic function panel and basal metabolic panel.

## 2014-02-24 NOTE — Assessment & Plan Note (Signed)
We reviewed her lipids triglycerides were higher this time.  She is not having any side effects from her Lipitor

## 2014-03-10 ENCOUNTER — Other Ambulatory Visit: Payer: Self-pay | Admitting: Cardiology

## 2014-03-31 ENCOUNTER — Ambulatory Visit (INDEPENDENT_AMBULATORY_CARE_PROVIDER_SITE_OTHER): Payer: BC Managed Care – PPO | Admitting: Nurse Practitioner

## 2014-03-31 ENCOUNTER — Encounter: Payer: Self-pay | Admitting: Nurse Practitioner

## 2014-03-31 VITALS — BP 100/80 | HR 68 | Temp 98.5°F | Wt 145.4 lb

## 2014-03-31 DIAGNOSIS — R059 Cough, unspecified: Secondary | ICD-10-CM

## 2014-03-31 DIAGNOSIS — R05 Cough: Secondary | ICD-10-CM

## 2014-03-31 DIAGNOSIS — R5383 Other fatigue: Secondary | ICD-10-CM

## 2014-03-31 DIAGNOSIS — R5381 Other malaise: Secondary | ICD-10-CM

## 2014-03-31 DIAGNOSIS — J209 Acute bronchitis, unspecified: Secondary | ICD-10-CM

## 2014-03-31 MED ORDER — ALBUTEROL SULFATE HFA 108 (90 BASE) MCG/ACT IN AERS: 2.0000 | INHALATION_SPRAY | Freq: Four times a day (QID) | RESPIRATORY_TRACT | Status: DC | PRN

## 2014-03-31 MED ORDER — AZITHROMYCIN 250 MG PO TABS: ORAL_TABLET | ORAL | Status: DC

## 2014-03-31 MED ORDER — PREDNISONE 10 MG PO TABS: 10.0000 mg | ORAL_TABLET | Freq: Three times a day (TID) | ORAL | Status: DC

## 2014-03-31 NOTE — Patient Instructions (Signed)
Rest Adequate fluid intake Take medications as prescribed Go to ER for Shortness of breath not relieved Call clinic if symptoms worsen or not resloved. Follow up appointment in 1 week.   Continue Cough supressant over the counter.   Acute Bronchitis Bronchitis is inflammation of the airways that extend from the windpipe into the lungs (bronchi). The inflammation often causes mucus to develop. This leads to a cough, which is the most common symptom of bronchitis.  In acute bronchitis, the condition usually develops suddenly and goes away over time, usually in a couple weeks. Smoking, allergies, and asthma can make bronchitis worse. Repeated episodes of bronchitis may cause further lung problems.  CAUSES Acute bronchitis is most often caused by the same virus that causes a cold. The virus can spread from person to person (contagious) through coughing, sneezing, and touching contaminated objects. SIGNS AND SYMPTOMS   Cough.   Fever.   Coughing up mucus.   Body aches.   Chest congestion.   Chills.   Shortness of breath.   Sore throat.  DIAGNOSIS  Acute bronchitis is usually diagnosed through a physical exam. Your health care provider will also ask you questions about your medical history. Tests, such as chest X-rays, are sometimes done to rule out other conditions.  TREATMENT  Acute bronchitis usually goes away in a couple weeks. Oftentimes, no medical treatment is necessary. Medicines are sometimes given for relief of fever or cough. Antibiotic medicines are usually not needed but may be prescribed in certain situations. In some cases, an inhaler may be recommended to help reduce shortness of breath and control the cough. A cool mist vaporizer may also be used to help thin bronchial secretions and make it easier to clear the chest.  HOME CARE INSTRUCTIONS  Get plenty of rest.   Drink enough fluids to keep your urine clear or pale yellow (unless you have a medical  condition that requires fluid restriction). Increasing fluids may help thin your respiratory secretions (sputum) and reduce chest congestion, and it will prevent dehydration.   Take medicines only as directed by your health care provider.  If you were prescribed an antibiotic medicine, finish it all even if you start to feel better.  Avoid smoking and secondhand smoke. Exposure to cigarette smoke or irritating chemicals will make bronchitis worse. If you are a smoker, consider using nicotine gum or skin patches to help control withdrawal symptoms. Quitting smoking will help your lungs heal faster.   Reduce the chances of another bout of acute bronchitis by washing your hands frequently, avoiding people with cold symptoms, and trying not to touch your hands to your mouth, nose, or eyes.   Keep all follow-up visits as directed by your health care provider.  SEEK MEDICAL CARE IF: Your symptoms do not improve after 1 week of treatment.  SEEK IMMEDIATE MEDICAL CARE IF:  You develop an increased fever or chills.   You have chest pain.   You have severe shortness of breath.  You have bloody sputum.   You develop dehydration.  You faint or repeatedly feel like you are going to pass out.  You develop repeated vomiting.  You develop a severe headache. MAKE SURE YOU:   Understand these instructions.  Will watch your condition.  Will get help right away if you are not doing well or get worse. Document Released: 08/10/2004 Document Revised: 11/17/2013 Document Reviewed: 12/24/2012 [facility] Patient Information 2015 [place], [place]. This information is not intended to replace advice given to you by  your health care provider. Make sure you discuss any questions you have with your health care provider.  

## 2014-03-31 NOTE — Progress Notes (Signed)
Subjective:    Patient ID: Tina [name], female    DOB: 1950-12-27, 63 y.o.   MRN: [id number]  HPI Patient is seen in office today for complaints of cough and shortness of breath for past 2 weeks which is progressively worsening. She describes difficulty taking a deep breath in,  Patient reports cough is non productive.  She feels fatigued.  She has experienced an increase in palpitations and is taking an additional 1/2 dose of metoprolol with good effect.  She denies headache or sore throat. Fever intially no chills.      Review of Systems  Constitutional: Positive for fatigue. Negative for fever and chills.       Fatigue  HENT: Positive for sinus pressure. Negative for congestion, ear discharge, ear pain, postnasal drip, rhinorrhea, sneezing and sore throat.   Respiratory: Positive for cough, chest tightness and shortness of breath.   Cardiovascular: Positive for palpitations.       Taking additional Metoprolol 25mg  daily per Dr. Katheren Puller recommendation for increase in palpitations  Skin: Negative.      Past Medical History  Diagnosis Date  . Palpitations   . Hypercholesterolemia   . GERD (gastroesophageal reflux disease)   . Anxiety and depression   . Migraines   . Osteopenia     RLE tib fx 03/2012 following fall    History   Social History  . Marital Status: Married    Spouse Name: N/A    Number of Children: N/A  . Years of Education: N/A   Occupational History  . Not on file.   Social History Main Topics  . Smoking status: Never Smoker   . Smokeless tobacco: Not on file  . Alcohol Use: No  . Drug Use: No  . Sexual Activity: Not on file   Other Topics Concern  . Not on file   Social History Narrative   Retired high school business teacher   Master's degree educatation   Married, lives with spouse -    Past Surgical History  Procedure Laterality Date  . Other surgical history      gastroesophageal surgery x 2 for reflx  . Tonsillectomy  1977  .  Cesarean section  1982  . Nissen fundlaplacation  2004, redo 2009  . Hernia repair    . Cervical fusion      Family History  Problem Relation Age of Onset  . Alzheimer's disease Mother   . Alzheimer's disease Father   . Prostate cancer Father   . Hyperlipidemia Father   . Heart disease Father   . Stroke Paternal Grandmother   . Hypertension Other   . Congestive Heart Failure Father     Allergies  Allergen Reactions  . Codeine     nausea  . Penicillins     rash  . Simvastatin     Leg pain    Current Outpatient Prescriptions on File Prior to Visit  Medication Sig Dispense Refill  . acetaminophen (TYLENOL) 325 MG tablet Take 650 mg by mouth as needed.        . ACTONEL 35 MG tablet TAKE 1 TABLET BY MOUTH EVERY 7 DAYS WITH WATER ON EMPTY STOMACH, THEN NO FOOD, STAY UPRIGHT 30MINUTE  4 tablet  5  . aspirin 81 MG tablet Take 81 mg by mouth daily.        [name] atorvastatin (LIPITOR) 20 MG tablet TAKE 1 TABLET BY MOUTH EVERY DAY  90 tablet  1  . calcium-vitamin D (OSCAL 500/200 D-3)  500-200 MG-UNIT per tablet Take 2 tablets by mouth 2 (two) times daily.      . cetirizine (ZYRTEC) 10 MG tablet Take 1 tablet (10 mg total) by mouth daily.  30 tablet  11  . Coenzyme Q10 (COQ-10 PO) Take by mouth.        . dexlansoprazole (DEXILANT) 60 MG capsule Take 60 mg by mouth daily.      . fluticasone (FLONASE) 50 MCG/ACT nasal spray Place 1 spray into both nostrils daily.  16 g  2  . meclizine (ANTIVERT) 25 MG tablet Take 1 tablet (25 mg total) by mouth as needed.  30 tablet  0  . metoprolol (LOPRESSOR) 50 MG tablet TAKE 1 TABLET BY MOUTH 2 TIMES DAILY.  180 tablet  2  . multivitamin (THERAGRAN) per tablet Take 1 tablet by mouth daily.         No current facility-administered medications on file prior to visit.    BP 100/80  Pulse 68  Temp(Src) 98.5 F (36.9 C) (Oral)  Wt 145 lb 6 oz (65.942 kg)  SpO2 95%        Objective:   Physical Exam  Constitutional: She is oriented to person,  place, and time. She appears well-developed and well-nourished. No distress.  HENT:  Head: Normocephalic.  Right Ear: External ear normal.  Left Ear: External ear normal.  Nose: Nose normal.  Mouth/Throat: Oropharynx is clear and moist. No oropharyngeal exudate.  No lymphadenopathy  Eyes: Pupils are equal, round, and reactive to light.  Neck: Neck supple. No thyromegaly present.  Cardiovascular: Normal rate, regular rhythm and normal heart sounds.   Pulmonary/Chest: Effort normal. No respiratory distress. She has wheezes. She has rales. She exhibits no tenderness.  Wheezing and rhonchi in upper bronchioles bilaterally.  Rales in  Bases bilaterally.  Lymphadenopathy:    She has no cervical adenopathy.  Neurological: She is alert and oriented to person, place, and time.  Skin: Skin is warm and dry.  Psychiatric: She has a normal mood and affect.          Assessment & Plan:  1. Acute bronchitis, unspecified organism Rest, adequate fluid intake.  - azithromycin (ZITHROMAX) 250 MG tablet; 2 tablets po on day 1, then 1 tablet daily until complete  Dispense: 6 tablet; Refill: 0 - predniSONE (DELTASONE) 10 MG tablet; Take 1 tablet (10 mg total) by mouth 3 (three) times daily with meals.  Dispense: 15 tablet; Refill: 0  2. Cough Continue cough suppressant over the counter as needed - albuterol (PROVENTIL HFA;VENTOLIN HFA) 108 (90 BASE) MCG/ACT inhaler; Inhale 2 puffs into the lungs every 6 (six) hours as needed for wheezing or shortness of breath.  Dispense: 1 Inhaler; Refill: 2  3. Other malaise and fatigue See above.  Go to ER if shortness of breath un relieved Call clinic if symptoms worsen or not resolved Follow up appointment in 1 week if not better.  Call clinic with questions or concerns.

## 2014-03-31 NOTE — Progress Notes (Signed)
Pre visit review using our clinic review tool, if applicable. No additional management support is needed unless otherwise documented below in the visit note. 

## 2014-04-01 ENCOUNTER — Ambulatory Visit: Payer: BC Managed Care – PPO | Admitting: Internal Medicine

## 2014-04-14 ENCOUNTER — Encounter: Payer: Self-pay | Admitting: Internal Medicine

## 2014-04-14 ENCOUNTER — Other Ambulatory Visit (INDEPENDENT_AMBULATORY_CARE_PROVIDER_SITE_OTHER): Payer: BC Managed Care – PPO

## 2014-04-14 ENCOUNTER — Ambulatory Visit (INDEPENDENT_AMBULATORY_CARE_PROVIDER_SITE_OTHER): Payer: BC Managed Care – PPO | Admitting: Internal Medicine

## 2014-04-14 VITALS — BP 128/86 | HR 73 | Temp 98.2°F | Ht 65.5 in | Wt 148.5 lb

## 2014-04-14 DIAGNOSIS — R5383 Other fatigue: Secondary | ICD-10-CM

## 2014-04-14 DIAGNOSIS — E7800 Pure hypercholesterolemia, unspecified: Secondary | ICD-10-CM

## 2014-04-14 DIAGNOSIS — R002 Palpitations: Secondary | ICD-10-CM

## 2014-04-14 DIAGNOSIS — R5381 Other malaise: Secondary | ICD-10-CM

## 2014-04-14 DIAGNOSIS — Z0000 Encounter for general adult medical examination without abnormal findings: Secondary | ICD-10-CM

## 2014-04-14 DIAGNOSIS — M810 Age-related osteoporosis without current pathological fracture: Secondary | ICD-10-CM

## 2014-04-14 LAB — CBC WITH DIFFERENTIAL/PLATELET
BASOS ABS: 0 10*3/uL (ref 0.0–0.1)
BASOS PCT: 0.5 % (ref 0.0–3.0)
EOS PCT: 1.4 % (ref 0.0–5.0)
Eosinophils Absolute: 0.1 10*3/uL (ref 0.0–0.7)
HCT: 41.6 % (ref 36.0–46.0)
HEMOGLOBIN: 14.2 g/dL (ref 12.0–15.0)
LYMPHS PCT: 25.6 % (ref 12.0–46.0)
Lymphs Abs: 2.3 10*3/uL (ref 0.7–4.0)
MCHC: 34.2 g/dL (ref 30.0–36.0)
MCV: 88.8 fl (ref 78.0–100.0)
MONOS PCT: 9.8 % (ref 3.0–12.0)
Monocytes Absolute: 0.9 10*3/uL (ref 0.1–1.0)
NEUTROS ABS: 5.7 10*3/uL (ref 1.4–7.7)
Neutrophils Relative %: 62.7 % (ref 43.0–77.0)
Platelets: 250 10*3/uL (ref 150.0–400.0)
RBC: 4.69 Mil/uL (ref 3.87–5.11)
RDW: 13.1 % (ref 11.5–15.5)
WBC: 9 10*3/uL (ref 4.0–10.5)

## 2014-04-14 LAB — VITAMIN D 25 HYDROXY (VIT D DEFICIENCY, FRACTURES): VITD: 40.73 ng/mL (ref 30.00–100.00)

## 2014-04-14 LAB — TSH: TSH: 1.04 u[IU]/mL (ref 0.35–4.50)

## 2014-04-14 NOTE — Assessment & Plan Note (Signed)
Hx accidental RLE tib fx 03/2012 with fall On Ca+D, works with sports [facility] for same started bisphos 03/2013 (tol actonel better than boniva due to GI upset) Reviewed recommended doses of Vit D+Ca

## 2014-04-14 NOTE — Progress Notes (Signed)
Pre visit review using our clinic review tool, if applicable. No additional management support is needed unless otherwise documented below in the visit note. 

## 2014-04-14 NOTE — Patient Instructions (Addendum)
It was good to see you today.  We have reviewed your prior records including labs and tests today  Return for flu shot next week (nurse visit)  Other Health Maintenance reviewed - all other recommended immunizations and age-appropriate screenings are up-to-date.  Test(s) ordered today. Your results will be released to [place] (or called to you) after review, usually within 72hours after test completion. If any changes need to be made, you will be notified at that same time.  Medications reviewed and updated, no changes recommended at this time.  Please schedule followup in 12 months for annual exam and labs, call sooner if problems.   Health Maintenance Adopting a healthy lifestyle and getting preventive care can go a long way to promote health and wellness. Talk with your health care provider about what schedule of regular examinations is right for you. This is a good chance for you to check in with your provider about disease prevention and staying healthy. In between checkups, there are plenty of things you can do on your own. Experts have done a lot of research about which lifestyle changes and preventive measures are most likely to keep you healthy. Ask your health care provider for more information. WEIGHT AND DIET  Eat a healthy diet  Be sure to include plenty of vegetables, fruits, low-fat dairy products, and lean protein.  Do not eat a lot of foods high in solid fats, added sugars, or salt.  Get regular exercise. This is one of the most important things you can do for your health.  Most adults should exercise for at least 150 minutes each week. The exercise should increase your heart rate and make you sweat (moderate-intensity exercise).  Most adults should also do strengthening exercises at least twice a week. This is in addition to the moderate-intensity exercise.  Maintain a healthy weight  Body mass index (BMI) is a measurement that can be used to identify possible  weight problems. It estimates body fat based on height and weight. Your health care provider can help determine your BMI and help you achieve or maintain a healthy weight.  For females 39 years of age and older:   A BMI below 18.5 is considered underweight.  A BMI of 18.5 to 24.9 is normal.  A BMI of 25 to 29.9 is considered overweight.  A BMI of 30 and above is considered obese.  Watch levels of cholesterol and blood lipids  You should start having your blood tested for lipids and cholesterol at 63 years of age, then have this test every 5 years.  You may need to have your cholesterol levels checked more often if:  Your lipid or cholesterol levels are high.  You are older than 63 years of age.  You are at high risk for heart disease.  CANCER SCREENING   Lung Cancer  Lung cancer screening is recommended for adults 41-42 years old who are at high risk for lung cancer because of a history of smoking.  A yearly low-dose CT scan of the lungs is recommended for people who:  Currently smoke.  Have quit within the past 15 years.  Have at least a 30-pack-year history of smoking. A pack year is smoking an average of one pack of cigarettes a day for 1 year.  Yearly screening should continue until it has been 15 years since you quit.  Yearly screening should stop if you develop a health problem that would prevent you from having lung cancer treatment.  Breast Cancer  Practice breast self-awareness. This means understanding how your breasts normally appear and feel.  It also means doing regular breast self-exams. Let your health care provider know about any changes, no matter how small.  If you are in your 20s or 30s, you should have a clinical breast exam (CBE) by a health care provider every 1-3 years as part of a regular health exam.  If you are 70 or older, have a CBE every year. Also consider having a breast X-ray (mammogram) every year.  If you have a family history of  breast cancer, talk to your health care provider about genetic screening.  If you are at high risk for breast cancer, talk to your health care provider about having an MRI and a mammogram every year.  Breast cancer gene (BRCA) assessment is recommended for women who have family members with BRCA-related cancers. BRCA-related cancers include:  Breast.  Ovarian.  Tubal.  Peritoneal cancers.  Results of the assessment will determine the need for genetic counseling and BRCA1 and BRCA2 testing. Cervical Cancer Routine pelvic examinations to screen for cervical cancer are no longer recommended for nonpregnant women who are considered low risk for cancer of the pelvic organs (ovaries, uterus, and vagina) and who do not have symptoms. A pelvic examination may be necessary if you have symptoms including those associated with pelvic infections. Ask your health care provider if a screening pelvic exam is right for you.   The Pap test is the screening test for cervical cancer for women who are considered at risk.  If you had a hysterectomy for a problem that was not cancer or a condition that could lead to cancer, then you no longer need Pap tests.  If you are older than 65 years, and you have had normal Pap tests for the past 10 years, you no longer need to have Pap tests.  If you have had past treatment for cervical cancer or a condition that could lead to cancer, you need Pap tests and screening for cancer for at least 20 years after your treatment.  If you no longer get a Pap test, assess your risk factors if they change (such as having a new sexual partner). This can affect whether you should start being screened again.  Some women have medical problems that increase their chance of getting cervical cancer. If this is the case for you, your health care provider may recommend more frequent screening and Pap tests.  The human papillomavirus (HPV) test is another test that may be used for  cervical cancer screening. The HPV test looks for the virus that can cause cell changes in the cervix. The cells collected during the Pap test can be tested for HPV.  The HPV test can be used to screen women 67 years of age and older. Getting tested for HPV can extend the interval between normal Pap tests from three to five years.  An HPV test also should be used to screen women of any age who have unclear Pap test results.  After 63 years of age, women should have HPV testing as often as Pap tests.  Colorectal Cancer  This type of cancer can be detected and often prevented.  Routine colorectal cancer screening usually begins at 63 years of age and continues through 63 years of age.  Your health care provider may recommend screening at an earlier age if you have risk factors for colon cancer.  Your health care provider may also recommend using home test kits  to check for hidden blood in the stool.  A small camera at the end of a tube can be used to examine your colon directly (sigmoidoscopy or colonoscopy). This is done to check for the earliest forms of colorectal cancer.  Routine screening usually begins at age 6.  Direct examination of the colon should be repeated every 5-10 years through 63 years of age. However, you may need to be screened more often if early forms of precancerous polyps or small growths are found. Skin Cancer  Check your skin from head to toe regularly.  Tell your health care provider about any new moles or changes in moles, especially if there is a change in a mole's shape or color.  Also tell your health care provider if you have a mole that is larger than the size of a pencil eraser.  Always use sunscreen. Apply sunscreen liberally and repeatedly throughout the day.  Protect yourself by wearing long sleeves, pants, a wide-brimmed hat, and sunglasses whenever you are outside. HEART DISEASE, DIABETES, AND HIGH BLOOD PRESSURE   Have your blood pressure  checked at least every 1-2 years. High blood pressure causes heart disease and increases the risk of stroke.  If you are between 72 years and 61 years old, ask your health care provider if you should take aspirin to prevent strokes.  Have regular diabetes screenings. This involves taking a blood sample to check your fasting blood sugar level.  If you are at a normal weight and have a low risk for diabetes, have this test once every three years after 63 years of age.  If you are overweight and have a high risk for diabetes, consider being tested at a younger age or more often. PREVENTING INFECTION  Hepatitis B  If you have a higher risk for hepatitis B, you should be screened for this virus. You are considered at high risk for hepatitis B if:  You were born in a country where hepatitis B is common. Ask your health care provider which countries are considered high risk.  Your parents were born in a high-risk country, and you have not been immunized against hepatitis B (hepatitis B vaccine).  You have HIV or AIDS.  You use needles to inject street drugs.  You live with someone who has hepatitis B.  You have had sex with someone who has hepatitis B.  You get hemodialysis treatment.  You take certain medicines for conditions, including cancer, organ transplantation, and autoimmune conditions. Hepatitis C  Blood testing is recommended for:  Everyone born from [birth year] through 1965.  Anyone with known risk factors for hepatitis C. Sexually transmitted infections (STIs)  You should be screened for sexually transmitted infections (STIs) including gonorrhea and chlamydia if:  You are sexually active and are younger than 63 years of age.  You are older than 63 years of age and your health care provider tells you that you are at risk for this type of infection.  Your sexual activity has changed since you were last screened and you are at an increased risk for chlamydia or gonorrhea. Ask  your health care provider if you are at risk.  If you do not have HIV, but are at risk, it may be recommended that you take a prescription medicine daily to prevent HIV infection. This is called pre-exposure prophylaxis (PrEP). You are considered at risk if:  You are sexually active and do not regularly use condoms or know the HIV status of your partner(s).  You  take drugs by injection.  You are sexually active with a partner who has HIV. Talk with your health care provider about whether you are at high risk of being infected with HIV. If you choose to begin PrEP, you should first be tested for HIV. You should then be tested every 3 months for as long as you are taking PrEP.  PREGNANCY   If you are premenopausal and you may become pregnant, ask your health care provider about preconception counseling.  If you may become pregnant, take 400 to 800 micrograms (mcg) of folic acid every day.  If you want to prevent pregnancy, talk to your health care provider about birth control (contraception). OSTEOPOROSIS AND MENOPAUSE   Osteoporosis is a disease in which the bones lose minerals and strength with aging. This can result in serious bone fractures. Your risk for osteoporosis can be identified using a bone density scan.  If you are 21 years of age or older, or if you are at risk for osteoporosis and fractures, ask your health care provider if you should be screened.  Ask your health care provider whether you should take a calcium or vitamin D supplement to lower your risk for osteoporosis.  Menopause may have certain physical symptoms and risks.  Hormone replacement therapy may reduce some of these symptoms and risks. Talk to your health care provider about whether hormone replacement therapy is right for you.  HOME CARE INSTRUCTIONS   Schedule regular health, dental, and eye exams.  Stay current with your immunizations.   Do not use any tobacco products including cigarettes, chewing  tobacco, or electronic cigarettes.  If you are pregnant, do not drink alcohol.  If you are breastfeeding, limit how much and how often you drink alcohol.  Limit alcohol intake to no more than 1 drink per day for nonpregnant women. One drink equals 12 ounces of beer, 5 ounces of wine, or 1 ounces of hard liquor.  Do not use street drugs.  Do not share needles.  Ask your health care provider for help if you need support or information about quitting drugs.  Tell your health care provider if you often feel depressed.  Tell your health care provider if you have ever been abused or do not feel safe at home. Document Released: 01/16/2011 Document Revised: 11/17/2013 Document Reviewed: 06/04/2013 [facility] Patient Information 2015 [place], [place]. This information is not intended to replace advice given to you by your health care provider. Make sure you discuss any questions you have with your health care provider. Osteoporosis Osteoporosis happens when your bones become weak because of bone loss. Weak bones can break (fracture) more easily with slips or falls. You are more likely to develop osteoporosis if:  You are a woman.  You are older than 50 years.  You are white or Asian.  You are very thin.  Someone in your family has had osteoporosis.  You smoke or use nicotine. CAUSES   Smoking.  Too much drinking.  Being a weight below normal.  Not being active.  Not going outside in the sun enough.  Certain medical conditions, such as diabetes or Crohn disease.  Certain medicines, such as steroids or antiseizure medicines. TREATMENT  The goal of treatment is to strengthen bones. There are different types of medicines that help your bones. Some medicines make your bones more solid. Some medicines help to slow down how much bone you lose. Your doctor may check to see if you are getting enough calcium and vitamin  D in your diet. PREVENTION   Make sure you get enough calcium and  vitamin D.  Make sure you exercise often.  If you smoke, quit. MAKE SURE YOU:  Understand these instructions.  Will watch your condition.  Will get help right away if you are not doing well or get worse. Document Released: 09/25/2011 Document Reviewed: 09/25/2011 [facility] Patient Information 2015 [place]. This information is not intended to replace advice given to you by your health care provider. Make sure you discuss any questions you have with your health care provider.

## 2014-04-14 NOTE — Assessment & Plan Note (Signed)
On statin Well controlled last check, monitor annually The current medical regimen is effective;  continue present plan and medications.

## 2014-04-14 NOTE — Progress Notes (Signed)
Subjective:    Patient ID: Tina [name], female    DOB: [birth year], 63 y.o.   MRN: [id number]  HPI  patient is here today for annual physical. Patient feels well and has no complaints.  Also reviewed chronic medical issues and interval medical events  Past Medical History  Diagnosis Date  . Palpitations   . Hypercholesterolemia   . GERD (gastroesophageal reflux disease)   . Anxiety and depression   . Migraines   . Osteopenia     RLE tib fx 03/2012 following fall   Family History  Problem Relation Age of Onset  . Alzheimer's disease Mother   . Alzheimer's disease Father   . Prostate cancer Father   . Hyperlipidemia Father   . Heart disease Father   . Stroke Paternal Grandmother   . Hypertension Other   . Congestive Heart Failure Father     History  Substance Use Topics  . Smoking status: Never Smoker   . Smokeless tobacco: Not on file  . Alcohol Use: No    Review of Systems  Constitutional: Positive for fatigue. Negative for unexpected weight change.  Respiratory: Negative for cough (improved since bronchitis last week), shortness of breath and wheezing.   Cardiovascular: Negative for chest pain, palpitations and leg swelling.  Gastrointestinal: Negative for nausea, abdominal pain and diarrhea.  Neurological: Negative for dizziness, weakness, light-headedness and headaches.  Psychiatric/Behavioral: Negative for dysphoric mood. The patient is not nervous/anxious.   All other systems reviewed and are negative.      Objective:   Physical Exam  BP 128/86  Pulse 73  Temp(Src) 98.2 F (36.8 C) (Oral)  Ht 5' 5.5" (1.664 m)  Wt 148 lb 8 oz (67.359 kg)  BMI 24.33 kg/m2  SpO2 96% Wt Readings from Last 3 Encounters:  04/14/14 148 lb 8 oz (67.359 kg)  03/31/14 145 lb 6 oz (65.942 kg)  02/24/14 145 lb (65.772 kg)   Constitutional: She appears well-developed and well-nourished. No distress.  HENT: Head: Normocephalic and atraumatic. Ears: B TMs ok, no erythema  or effusion; Nose: Nose normal. Mouth/Throat: Oropharynx is clear and moist. No oropharyngeal exudate.  Eyes: Conjunctivae and EOM are normal. Pupils are equal, round, and reactive to light. No scleral icterus.  Neck: Normal range of motion. Neck supple. No JVD present. No thyromegaly present.  Cardiovascular: Normal rate, regular rhythm and normal heart sounds.  No murmur heard. No BLE edema. Pulmonary/Chest: Effort normal and breath sounds normal. No respiratory distress. She has no wheezes.  Abdominal: Soft. Bowel sounds are normal. She exhibits no distension. There is no tenderness. no masses GU/breast: defer to gyn Musculoskeletal: Normal range of motion, no joint effusions. No gross deformities Neurological: She is alert and oriented to person, place, and time. No cranial nerve deficit. Coordination, balance, strength, speech and gait are normal.  Skin: Skin is warm and dry. No rash noted. No erythema.  Psychiatric: She has a normal mood and affect. Her behavior is normal. Judgment and thought content normal.    Lab Results  Component Value Date   WBC 8.3 08/16/2007   HGB 12.5 08/16/2007   HCT 35.7* 08/16/2007   PLT 219 08/16/2007   GLUCOSE 85 02/23/2014   CHOL 160 02/23/2014   TRIG 195.0* 02/23/2014   HDL 40.30 02/23/2014   LDLCALC 81 02/23/2014   ALT 19 02/23/2014   AST 24 02/23/2014   NA 137 02/23/2014   K 3.9 02/23/2014   CL 99 02/23/2014   CREATININE 0.8 02/23/2014  BUN 17 02/23/2014   CO2 30 02/23/2014    Dg Chest 2 View  08/27/2013   CLINICAL DATA:  Heart palpitations and chest pain.  EXAM: CHEST  2 VIEW  COMPARISON:  08/12/2007  FINDINGS: Two views of the chest demonstrate surgical plate and screw fixation in lower cervical spine. The lungs are clear without airspace disease or edema. Heart and mediastinum are within normal limits. Again noted are surgical clips near the GE junction. Bony thorax is intact.  IMPRESSION: No active cardiopulmonary disease.   Electronically Signed   By:  Markus Daft M.D.   On: 08/27/2013 14:15       Assessment & Plan:   CPX/v70.0 - Patient has been counseled on age-appropriate routine health concerns for screening and prevention. These are reviewed and up-to-date. Immunizations are up-to-date or declined. Labs ordered and reviewed.  Fatigue - nonspecific symptoms/exam - check screening labs  Problem List Items Addressed This Visit   Heart palpitations     Long hx of same - controlled with beta-blocker  ?orthostatic symptoms in AM with low normal BP - pt will monitor to see if symptoms correlate with BP reading No changes recommended today - reassured re: cards recommendation to take extra 1/2 tab prn symptoms     Hypercholesterolemia     On statin Well controlled last check, monitor annually The current medical regimen is effective;  continue present plan and medications.     Osteoporosis, post-menopausal     Hx accidental RLE tib fx 03/2012 with fall On Ca+D, works with sports med for same started bisphos 03/2013 (tol actonel better than boniva due to GI upset) Reviewed recommended doses of Vit D+Ca    Relevant Orders      Vit D  25 hydroxy (rtn osteoporosis monitoring)    Other Visit Diagnoses   Routine general medical examination at a health care facility    -  Primary    Relevant Orders       CBC with Differential       TSH    Other malaise and fatigue        Relevant Orders       CBC with Differential       TSH       Vit D  25 hydroxy (rtn osteoporosis monitoring)

## 2014-04-14 NOTE — Assessment & Plan Note (Signed)
Long hx of same - controlled with beta-blocker  ?orthostatic symptoms in AM with low normal BP - pt will monitor to see if symptoms correlate with BP reading No changes recommended today - reassured re: cards recommendation to take extra 1/2 tab prn symptoms

## 2014-04-21 ENCOUNTER — Ambulatory Visit (INDEPENDENT_AMBULATORY_CARE_PROVIDER_SITE_OTHER): Payer: BC Managed Care – PPO

## 2014-04-21 DIAGNOSIS — Z23 Encounter for immunization: Secondary | ICD-10-CM

## 2014-06-02 ENCOUNTER — Telehealth: Payer: Self-pay | Admitting: Internal Medicine

## 2014-06-02 ENCOUNTER — Ambulatory Visit: Payer: Self-pay | Admitting: Family Medicine

## 2014-06-02 NOTE — Telephone Encounter (Signed)
Patient Information:  Caller Name: Jenny Reichmann  Phone: [phone]  Patient: Tina [name]  Gender: Female  DOB: 1951/07/13  Age: 63 Years  PCP: Gwendolyn Grant (Adults only)  Office Follow Up:  Does the office need to follow up with this patient?: No  Instructions For The Office: N/A   Symptoms  Reason For Call & Symptoms: Drainage down back of throat for 2 days, coughing increased since more drainage. Cough much worse this am 11/17, coughing spasms with clear to yellow mucus that  has been  blood tinged  several times.   Much concern since scheduled to be out of town for several days  Reviewed Health History In EMR: Yes  Reviewed Medications In EMR: Yes  Reviewed Allergies In EMR: Yes  Reviewed Surgeries / Procedures: Yes  Date of Onset of Symptoms: 05/31/2014  Guideline(s) Used:  Cough  Disposition Per Guideline:   See Today in Office  Reason For Disposition Reached:   Coughing up rusty-colored (reddish-brown) or blood-tinged sputum  Advice Given:  N/A  Patient Will Follow Care Advice:  YES  Appointment Scheduled:  06/02/2014 14:30:00 Appointment Scheduled Provider:  Charlann Boxer

## 2014-06-03 ENCOUNTER — Encounter: Payer: Self-pay | Admitting: Internal Medicine

## 2014-06-03 ENCOUNTER — Ambulatory Visit (INDEPENDENT_AMBULATORY_CARE_PROVIDER_SITE_OTHER): Payer: BC Managed Care – PPO | Admitting: Internal Medicine

## 2014-06-03 ENCOUNTER — Ambulatory Visit (INDEPENDENT_AMBULATORY_CARE_PROVIDER_SITE_OTHER)
Admission: RE | Admit: 2014-06-03 | Discharge: 2014-06-03 | Disposition: A | Discharge status: Home / Self Care | Payer: BC Managed Care – PPO | Source: Ambulatory Visit | Attending: Internal Medicine | Admitting: Internal Medicine

## 2014-06-03 VITALS — BP 124/70 | HR 67 | Temp 98.9°F | Resp 14 | Wt 148.2 lb

## 2014-06-03 DIAGNOSIS — R0689 Other abnormalities of breathing: Secondary | ICD-10-CM

## 2014-06-03 DIAGNOSIS — K2100 Gastro-esophageal reflux disease with esophagitis, without bleeding: Secondary | ICD-10-CM

## 2014-06-03 DIAGNOSIS — R059 Cough, unspecified: Secondary | ICD-10-CM

## 2014-06-03 DIAGNOSIS — R05 Cough: Secondary | ICD-10-CM

## 2014-06-03 DIAGNOSIS — R042 Hemoptysis: Secondary | ICD-10-CM

## 2014-06-03 DIAGNOSIS — R0989 Other specified symptoms and signs involving the circulatory and respiratory systems: Secondary | ICD-10-CM

## 2014-06-03 MED ORDER — AZITHROMYCIN 250 MG PO TABS: ORAL_TABLET | ORAL | Status: DC

## 2014-06-03 MED ORDER — BENZONATATE 200 MG PO CAPS: 200.0000 mg | ORAL_CAPSULE | Freq: Three times a day (TID) | ORAL | Status: DC | PRN

## 2014-06-03 NOTE — Progress Notes (Signed)
Pre visit review using our clinic review tool, if applicable. No additional management support is needed unless otherwise documented below in the visit note. 

## 2014-06-03 NOTE — Progress Notes (Signed)
   Subjective:    Patient ID: Tina [name], female    DOB: [birth year], 63 y.o.   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 This is usually manifested as "strangling" easily and coughing when she laughs.  As of 11/16 and 11/17 the cough has come more severe. She feels some drainage in her throat but denies upper respiratory tract symptoms or extrinsic symptoms.  The paroxysmal cough and been associated with thick white/yellow sputum with streaks of blood.  She also experienced some bleeding in the OS sclera as of 11/17. She has some discomfort with deep breathing.  She has never smoked. She has no history of asthma but has been prescribed  albuterol during bronchitic episodes. She is not on an ACE inhibitor.  She denies severe reflux symptoms at this time.    Review of Systems Frontal headache, facial pain , nasal purulence, dental pain, sore throat , otic pain or otic discharge denied. No fever , chills or sweats.Extrinsic symptoms of itchy, watery eyes, sneezing, or angioedema are denied. There is no significant cough, sputum production, wheezing,or  paroxysmal nocturnal dyspnea.     Objective:   Physical Exam   Positive or pertinent findings include: There is scleral hemorrhage on the left. She has very faint rales in the left lower lobe. Deep breathing does induce cough.  General appearance:good health ;well nourished; no acute distress or increased work of breathing is present.  No  lymphadenopathy about the head, neck, or axilla noted.  Eyes: No conjunctival inflammation or lid edema is present. There is no scleral icterus. Ears:  External ear exam shows no significant lesions or deformities.  Otoscopic examination reveals clear canals, tympanic membranes are intact bilaterally without bulging, retraction, inflammation or discharge. Nose:  External nasal examination shows no deformity or inflammation. Nasal  mucosa are pink and moist without lesions or exudates. No septal dislocation or deviation.No obstruction to airflow.  Oral exam: Dental hygiene is good; lips and gums are healthy appearing.There is no oropharyngeal erythema or exudate noted.  Neck:  No deformities, thyromegaly, masses, or tenderness noted.   Supple with full range of motion without pain.  Heart:  Normal rate and regular rhythm. S1 and S2 normal without gallop, murmur, click, rub or other extra sounds.  Lungs:Chest clear to auscultation; no wheezes, rhonchi,rales ,or rubs present.No increased work of breathing.   Extremities:  No cyanosis, edema, or clubbing  noted  Skin: Warm & dry w/o jaundice or tenting.         Assessment & Plan:  #1cough, both chronic and acute. The coughing is now associated with sputum production with streaky hemoptysis.  #2 severe reflux; status post fundoplication X 2  #3 abnormal breath sounds,rule out walking pneumonia  Plan: See orders and after visit summary

## 2014-06-03 NOTE — Patient Instructions (Signed)
Reflux of gastric acid may be asymptomatic as this may occur mainly during sleep.The triggers for reflux  include stress; the "aspirin family" ; alcohol; peppermint; and caffeine (coffee, tea, cola, and chocolate). The aspirin family would include aspirin and the nonsteroidal agents such as ibuprofen &  Naproxen. Tylenol would not cause reflux. If having symptoms ; food & drink should be avoided for @ least 2 hours before going to bed.   Carry room temperature water and sip liberally after coughing. 

## 2014-06-18 ENCOUNTER — Other Ambulatory Visit: Payer: Self-pay | Admitting: Internal Medicine

## 2014-07-13 ENCOUNTER — Other Ambulatory Visit: Payer: Self-pay | Admitting: Cardiology

## 2014-07-14 NOTE — Telephone Encounter (Signed)
Darlin Coco, MD at 02/24/2014 11:56 AM  metoprolol (LOPRESSOR) 50 MG tablet TAKE 1 TABLET BY MOUTH 2 TIMES DAILY    Patient Instructions     Your physician recommends that you continue on your current medications as directed. Please refer to the Current Medication list given to you today

## 2014-08-18 ENCOUNTER — Other Ambulatory Visit: Payer: Self-pay | Admitting: *Deleted

## 2014-08-18 DIAGNOSIS — E7800 Pure hypercholesterolemia, unspecified: Secondary | ICD-10-CM

## 2014-08-19 ENCOUNTER — Other Ambulatory Visit (INDEPENDENT_AMBULATORY_CARE_PROVIDER_SITE_OTHER): Payer: BC Managed Care – PPO | Admitting: *Deleted

## 2014-08-19 DIAGNOSIS — E7800 Pure hypercholesterolemia, unspecified: Secondary | ICD-10-CM

## 2014-08-19 LAB — BASIC METABOLIC PANEL
BUN: 17 mg/dL (ref 6–23)
CALCIUM: 9.1 mg/dL (ref 8.4–10.5)
CHLORIDE: 104 meq/L (ref 96–112)
CO2: 30 meq/L (ref 19–32)
CREATININE: 0.76 mg/dL (ref 0.40–1.20)
GFR: 81.49 mL/min (ref >60.00)
Glucose, Bld: 95 mg/dL (ref 70–99)
POTASSIUM: 4 meq/L (ref 3.5–5.1)
Sodium: 138 mEq/L (ref 135–145)

## 2014-08-19 LAB — LIPID PANEL
CHOLESTEROL: 159 mg/dL (ref 0–200)
HDL: 38.2 mg/dL — ABNORMAL LOW (ref >39.00)
LDL CALC: 81 mg/dL (ref 0–99)
NonHDL: 120.8
TRIGLYCERIDES: 199 mg/dL — AB (ref 0.0–149.0)
Total CHOL/HDL Ratio: 4
VLDL: 39.8 mg/dL (ref 0.0–40.0)

## 2014-08-19 LAB — HEPATIC FUNCTION PANEL
ALBUMIN: 4.1 g/dL (ref 3.5–5.2)
ALT: 13 U/L (ref 0–35)
AST: 18 U/L (ref 0–37)
Alkaline Phosphatase: 91 U/L (ref 39–117)
BILIRUBIN TOTAL: 0.7 mg/dL (ref 0.2–1.2)
Bilirubin, Direct: 0.2 mg/dL (ref 0.0–0.3)
Total Protein: 7.2 g/dL (ref 6.0–8.3)

## 2014-08-19 NOTE — Addendum Note (Signed)
Addended by: Eulis Foster on: 08/19/2014 09:46 AM   Modules accepted: Orders

## 2014-08-19 NOTE — Progress Notes (Signed)
Quick Note:  Please make copy of labs for patient visit. ______ 

## 2014-08-24 ENCOUNTER — Ambulatory Visit (INDEPENDENT_AMBULATORY_CARE_PROVIDER_SITE_OTHER): Payer: BC Managed Care – PPO | Admitting: Cardiology

## 2014-08-24 ENCOUNTER — Encounter: Payer: Self-pay | Admitting: Cardiology

## 2014-08-24 VITALS — BP 118/82 | HR 72 | Ht 64.5 in | Wt 146.8 lb

## 2014-08-24 DIAGNOSIS — R002 Palpitations: Secondary | ICD-10-CM

## 2014-08-24 DIAGNOSIS — R0789 Other chest pain: Secondary | ICD-10-CM

## 2014-08-24 DIAGNOSIS — R079 Chest pain, unspecified: Secondary | ICD-10-CM

## 2014-08-24 DIAGNOSIS — E7800 Pure hypercholesterolemia, unspecified: Secondary | ICD-10-CM

## 2014-08-24 DIAGNOSIS — K219 Gastro-esophageal reflux disease without esophagitis: Secondary | ICD-10-CM

## 2014-08-24 DIAGNOSIS — R0781 Pleurodynia: Secondary | ICD-10-CM

## 2014-08-24 HISTORY — DX: Pleurodynia: R07.81

## 2014-08-24 NOTE — Progress Notes (Signed)
Cardiology Office Note   Date:  08/24/2014   ID:  Tina [name], Tina [name] January 03, 1951, MRN [id number]  PCP:  Gwendolyn Grant, MD  Cardiologist:   Darlin Coco, MD   No chief complaint on file.     History of Present Illness: Tina [name] is a 65 y.o. female who presents for a six-month follow-up office visit.  This pleasant 64 year old woman is seen for a six-month followup office visit. She has a past history of palpitations and a history of hypercholesterolemia. In the past she has also had problems with low blood pressure and orthostatic hypotension. . The patient has been experiencing some occasional palpitations. . Her last echocardiogram on 01/06/08 and showed normal ejection fraction of 55-60% and mild mitral regurgitation. Her last chest x-ray in 2009 showed borderline cardiomegaly.  She and her husband have 2 children. 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 She and her husband are starting to think about downsizing and possibly moving to a smaller home in the [place] area. Since last visit the patient has been inserted about some intermittent left-sided chest discomfort for the past several weeks.  She has also been noticing some occasional numbness and heaviness in her left arm.  She has been short of breath with activity such as climbing stairs.  She thinks that some of the chest discomfort may be musculoskeletal.  Her last Myoview stress test was in 2008 and at that time there was no ischemia and her ejection fraction was 62%. The patient has a history of GERD.  She has frequent coughing episodes secondary to reflux.  She has had previous surgery for her hiatal hernia.  Past Medical History  Diagnosis Date  . Palpitations   . Hypercholesterolemia   . GERD (gastroesophageal reflux disease)   .  Anxiety and depression   . Migraines   . Osteopenia     RLE tib fx 03/2012 following fall    Past Surgical History  Procedure Laterality Date  . Other surgical history      gastroesophageal surgery x 2 for reflx  . Tonsillectomy  1977  . Cesarean section  1982  . Nissen fundlaplacation  2004, redo 2009  . Hernia repair    . Cervical fusion       Current Outpatient Prescriptions  Medication Sig Dispense Refill  . acetaminophen (TYLENOL) 325 MG tablet Take 650 mg by mouth as needed.      [name] aspirin 81 MG tablet Take 81 mg by mouth daily.      [name] atorvastatin (LIPITOR) 20 MG tablet TAKE 1 TABLET BY MOUTH EVERY DAY 90 tablet 1  . calcium-vitamin D (OSCAL 500/200 D-3) 500-200 MG-UNIT per tablet Take 2 tablets by mouth 2 (two) times daily.    . cetirizine (ZYRTEC) 10 MG tablet Take 1 tablet (10 mg total) by mouth daily. 30 tablet 11  . Coenzyme Q10 (COQ-10 PO) Take by mouth.      . dexlansoprazole (DEXILANT) 60 MG capsule Take 60 mg by mouth daily.    . meclizine (ANTIVERT) 25 MG tablet Take 1 tablet (25 mg total) by mouth as needed. 30 tablet 0  . metoprolol (LOPRESSOR) 50 MG tablet TAKE 1 TABLET BY MOUTH 2 TIMES DAILY. 180 tablet 1  . multivitamin (THERAGRAN) per tablet Take 1 tablet by mouth  daily.      . risedronate (ACTONEL) 35 MG tablet TAKE 1 TABLET BY MOUTH EVERY 7 DAYS WITH WATER ON EMPTY STOMACH, THEN NO FOOD, STAY UPRIGHT 30MINUTE 4 tablet 11   No current facility-administered medications for this visit.    Allergies:   Codeine; Penicillins; and Simvastatin    Social History:  The patient  reports that she has never smoked. She does not have any smokeless tobacco history on file. She reports that she does not drink alcohol or use illicit drugs.   Family History:  The patient's family history includes Alzheimer's disease in her father and mother; Congestive Heart Failure in her father; Heart disease in her father; Hyperlipidemia in her father; Hypertension in her other;  Prostate cancer in her father; Stroke in her paternal grandmother.    ROS:  Please see the history of present illness.   Otherwise, review of systems are positive for none.   All other systems are reviewed and negative.    PHYSICAL EXAM: VS:  BP 118/82 mmHg  Pulse 72  Ht 5' 4.5" (1.638 m)  Wt 146 lb 12.8 oz (66.588 kg)  BMI 24.82 kg/m2 , BMI Body mass index is 24.82 kg/(m^2). GEN: Well nourished, well developed, in no acute distress HEENT: normal Neck: no JVD, carotid bruits, or masses Cardiac: RRR; no murmurs, rubs, or gallops,no edema  Respiratory:  clear to auscultation bilaterally, normal work of breathing GI: soft, nontender, nondistended, + BS MS: no deformity or atrophy Skin: warm and dry, no rash Neuro:  Strength and sensation are intact Psych: euthymic mood, full affect   EKG:  EKG is ordered today. The ekg ordered today demonstrates normal sinus rhythm.  Within normal limits.  Since last tracing of 02/24/14, no significant change.   Recent Labs: 04/14/2014: Hemoglobin 14.2; Platelets 250.0; TSH 1.04 08/19/2014: ALT 13; BUN 17; Creatinine 0.76; Potassium 4.0; Sodium 138    Lipid Panel    Component Value Date/Time   CHOL 159 08/19/2014 0947   TRIG 199.0* 08/19/2014 0947   HDL 38.20* 08/19/2014 0947   CHOLHDL 4 08/19/2014 0947   VLDL 39.8 08/19/2014 0947   LDLCALC 81 08/19/2014 0947      Wt Readings from Last 3 Encounters:  08/24/14 146 lb 12.8 oz (66.588 kg)  06/03/14 148 lb 4 oz (67.246 kg)  04/14/14 148 lb 8 oz (67.359 kg)      Other studies Reviewed:    ASSESSMENT AND PLAN:  1. history of palpitations 2. Hypercholesterolemia 3. GERD 4. Osteoarthritis 5. history of nocturnal leg cramps 6. Chest discomfort with left arm discomfort.   Current medicines are reviewed at length with the patient today.  The patient does not have concerns regarding medicines.  The following changes have been made:  no change  Labs/ tests ordered today include:  Treadmill Myoview stress test.  She has a history of tachycardia when she leaves off her Toprol very long.  Therefore we will have her take her metoprolol the night before but omit it the morning of the stress test.   Orders Placed This Encounter  Procedures  . Lipid panel  . Hepatic function panel  . Basic metabolic panel  . Myocardial Perfusion Imaging  . EKG 12-Lead     Disposition:   FU with Dr. Mare Ferrari in 6 months   Signed, Darlin Coco, MD  08/24/2014 11:15 AM    [facility] [address], [place], [place]  [address] Phone: [phone]; Fax: [phone]

## 2014-08-24 NOTE — Patient Instructions (Signed)
Your physician has requested that you have en exercise stress myoview. For further information please visit [email or web address]. Please follow instruction sheet, as given.  Your physician recommends that you continue on your current medications as directed. Please refer to the Current Medication list given to you today.  Your physician wants you to follow-up in: 6 months with fasting labs (lp/bmet/hfp)  You will receive a reminder letter in the mail two months in advance. If you don't receive a letter, please call our office to schedule the follow-up appointment.

## 2014-08-27 ENCOUNTER — Ambulatory Visit (HOSPITAL_COMMUNITY): Payer: BC Managed Care – PPO | Attending: Cardiology | Admitting: Radiology

## 2014-08-27 DIAGNOSIS — R0789 Other chest pain: Secondary | ICD-10-CM | POA: Insufficient documentation

## 2014-08-27 DIAGNOSIS — R079 Chest pain, unspecified: Secondary | ICD-10-CM

## 2014-08-27 MED ORDER — TECHNETIUM TC 99M SESTAMIBI GENERIC - CARDIOLITE
11.0000 | Freq: Once | INTRAVENOUS | Status: AC | PRN
  Administered 2014-08-27: 11 via INTRAVENOUS

## 2014-08-27 MED ORDER — TECHNETIUM TC 99M SESTAMIBI GENERIC - CARDIOLITE
33.0000 | Freq: Once | INTRAVENOUS | Status: AC | PRN
  Administered 2014-08-27: 33 via INTRAVENOUS

## 2014-08-27 NOTE — Progress Notes (Signed)
[facility] [address] [place], [place] [address] [phone]    Cardiology Nuclear Med Study  Tina [name] is a 64 y.o. female     MRN : [id number]     DOB: 28-Oct-1950  Procedure Date: 08/27/2014  Nuclear Med Background Indication for Stress Test:  Evaluation for Ischemia History:  '08 MPI: EF: 62% NL Cardiac Risk Factors: Family History - CAD and Lipids  Symptoms:  Chest Pain, DOE and Palpitations   Nuclear Pre-Procedure Caffeine/Decaff Intake:  None> 12 hrs NPO After: 6:00pm   Lungs:  clear O2 Sat: 98% on room air. IV 0.9% NS with Angio Cath:  22g  IV Site: L Antecubital x 1, tolerated well IV Started by:  Irven Baltimore, RN  Chest Size (in):  34 Cup Size: C  Height: 5\' 4"  (1.626 m)  Weight:  145 lb (65.772 kg)  BMI:  Body mass index is 24.88 kg/(m^2). Tech Comments:  Patient took Lopressor at 7:30pm per Dr. Darlin Coco, MD    Nuclear Med Study 1 or 2 day study: 1 day  Stress Test Type:  Stress  Reading MD: N/A  Order Authorizing Provider:  Darlin Coco, MD  Resting Radionuclide: Technetium 34m Sestamibi  Resting Radionuclide Dose: 11.0 mCi   Stress Radionuclide:  Technetium 90m Sestamibi  Stress Radionuclide Dose:33.0 mCi           Stress Protocol Rest HR: 68 Stress HR: 155  Rest BP: 131/76 Stress BP: 168/83  Exercise Time (min): 6:30 METS: 7.70   Predicted Max HR: 157 bpm % Max HR: 98.73 bpm Rate Pressure Product: 26040   Dose of Adenosine (mg):  n/a Dose of Lexiscan: n/a mg  Dose of Atropine (mg): n/a Dose of Dobutamine: n/a mcg/kg/min (at max HR)  Stress Test Technologist: Perrin Maltese, EMT-P  Nuclear Technologist:  Earl Many, CNMT     Rest Procedure:  Myocardial perfusion imaging was performed at rest 45 minutes following the intravenous administration of Technetium 65m Sestamibi. Rest ECG: NSR with non-specific ST-T wave changes  Stress Procedure:  The patient exercised on the treadmill utilizing the  Bruce Protocol for 6:30 minutes. The patient stopped due to sob, fatigue, and denied any chest pain.  Technetium 77m Sestamibi was injected at peak exercise and myocardial perfusion imaging was performed after a brief delay. Stress ECG: No significant change from baseline ECG  QPS Raw Data Images:  Significant bowel uptake Stress Images:  Decreased apical uptake Rest Images:  Decreased apical uptake Subtraction (SDS):  No evidence of ischemia. Transient Ischemic Dilatation (Normal <1.22):  0.97 Lung/Heart Ratio (Normal <0.45):  0.32  Quantitative Gated Spect Images QGS EDV:  80 ml QGS ESV:  36 ml  Impression Exercise Capacity:  Fair exercise capacity. BP Response:  Normal blood pressure response. Clinical Symptoms:  There is dyspnea. ECG Impression:  No significant ST segment change suggestive of ischemia. Comparison with Prior Nuclear Study: No images to compare  Overall Impression:  Low risk stress nuclear study Small apical defect worse on resting images.  No ischemia.  LV Ejection Fraction: 55%.  LV Wall Motion:  NL LV Function; NL Wall Motion   Jenkins Rouge

## 2014-09-02 ENCOUNTER — Telehealth: Payer: Self-pay | Admitting: Cardiology

## 2014-09-02 NOTE — Telephone Encounter (Signed)
Left message to call back  

## 2014-09-02 NOTE — Telephone Encounter (Signed)
New message ° ° ° ° °Returning Tina [name]'s call  ° °

## 2014-09-02 NOTE — Telephone Encounter (Signed)
F/U       Pt returning call from today.    Please call back.

## 2014-09-02 NOTE — Telephone Encounter (Signed)
-----   Message from Darlin Coco, MD sent at 08/28/2014  9:09 AM EST ----- Please report.  The stress test is low risk.  There was no ischemia.  The ejection fraction was normal at 55%.  Continue current medication.

## 2014-09-03 NOTE — Telephone Encounter (Signed)
Agree.  She needs to follow-up with her neurosurgeon regarding this.

## 2014-09-03 NOTE — Telephone Encounter (Signed)
-----   Message from Darlin Coco, MD sent at 08/28/2014  9:09 AM EST ----- Please report.  The stress test is low risk.  There was no ischemia.  The ejection fraction was normal at 55%.  Continue current medication.

## 2014-09-03 NOTE — Telephone Encounter (Signed)
Advised patient.  Patient stated she does continue to have left shoulder pain that is sore when she presses on it or reaches up. Sometimes pain there without movement  Pain yesterday was severe  Also has had pain in her right shoulder Patient has appointment with neurosurgeon at office that she has seen before Advised patient to call office and see if appointment could be moved up

## 2014-09-09 ENCOUNTER — Encounter: Payer: Self-pay | Admitting: Cardiology

## 2014-09-16 ENCOUNTER — Other Ambulatory Visit: Payer: Self-pay | Admitting: Cardiology

## 2014-10-07 ENCOUNTER — Other Ambulatory Visit: Payer: Self-pay | Admitting: Internal Medicine

## 2014-10-10 ENCOUNTER — Encounter: Payer: Self-pay | Admitting: Cardiology

## 2015-01-19 ENCOUNTER — Other Ambulatory Visit: Payer: Self-pay | Admitting: Cardiology

## 2015-03-04 ENCOUNTER — Other Ambulatory Visit (INDEPENDENT_AMBULATORY_CARE_PROVIDER_SITE_OTHER): Payer: BC Managed Care – PPO | Admitting: *Deleted

## 2015-03-04 DIAGNOSIS — E7800 Pure hypercholesterolemia, unspecified: Secondary | ICD-10-CM

## 2015-03-04 LAB — BASIC METABOLIC PANEL
BUN: 19 mg/dL (ref 6–23)
CALCIUM: 9.4 mg/dL (ref 8.4–10.5)
CHLORIDE: 102 meq/L (ref 96–112)
CO2: 29 mEq/L (ref 19–32)
CREATININE: 0.72 mg/dL (ref 0.40–1.20)
GFR: 86.59 mL/min (ref >60.00)
Glucose, Bld: 94 mg/dL (ref 70–99)
Potassium: 4 mEq/L (ref 3.5–5.1)
Sodium: 139 mEq/L (ref 135–145)

## 2015-03-04 LAB — HEPATIC FUNCTION PANEL
ALBUMIN: 4.2 g/dL (ref 3.5–5.2)
ALT: 14 U/L (ref 0–35)
AST: 20 U/L (ref 0–37)
Alkaline Phosphatase: 91 U/L (ref 39–117)
BILIRUBIN DIRECT: 0.1 mg/dL (ref 0.0–0.3)
TOTAL PROTEIN: 7.7 g/dL (ref 6.0–8.3)
Total Bilirubin: 0.6 mg/dL (ref 0.2–1.2)

## 2015-03-04 LAB — LIPID PANEL
CHOL/HDL RATIO: 5
CHOLESTEROL: 164 mg/dL (ref 0–200)
HDL: 33.8 mg/dL — AB (ref >39.00)
LDL Cholesterol: 109 mg/dL — ABNORMAL HIGH (ref 0–99)
NonHDL: 130.15
TRIGLYCERIDES: 104 mg/dL (ref 0.0–149.0)
VLDL: 20.8 mg/dL (ref 0.0–40.0)

## 2015-03-04 NOTE — Progress Notes (Signed)
Quick Note:  Please make copy of labs for patient visit. ______ 

## 2015-03-11 ENCOUNTER — Ambulatory Visit (INDEPENDENT_AMBULATORY_CARE_PROVIDER_SITE_OTHER): Payer: BC Managed Care – PPO | Admitting: Cardiology

## 2015-03-11 ENCOUNTER — Encounter: Payer: Self-pay | Admitting: Cardiology

## 2015-03-11 VITALS — BP 110/70 | HR 60 | Ht 64.0 in | Wt 139.0 lb

## 2015-03-11 DIAGNOSIS — R002 Palpitations: Secondary | ICD-10-CM | POA: Diagnosis not present

## 2015-03-11 DIAGNOSIS — K219 Gastro-esophageal reflux disease without esophagitis: Secondary | ICD-10-CM

## 2015-03-11 DIAGNOSIS — E7800 Pure hypercholesterolemia, unspecified: Secondary | ICD-10-CM

## 2015-03-11 NOTE — Patient Instructions (Signed)
Medication Instructions:  Your physician recommends that you continue on your current medications as directed. Please refer to the Current Medication list given to you today.  Labwork: none  Testing/Procedures: none  Follow-Up: Your physician wants you to follow-up in: 6 months with fasting labs (lp/bmet/hfp)  You will receive a reminder letter in the mail two months in advance. If you don't receive a letter, please call our office to schedule the follow-up appointment.

## 2015-03-11 NOTE — Progress Notes (Signed)
Cardiology Office Note   Date:  03/11/2015   ID:  Yamna, Mackel 1951-05-30, MRN [id number]  PCP:  Ronita Hipps, MD  Cardiologist: Darlin Coco MD  Chief Complaint  Patient presents with  . Cough      History of Present Illness: Tina [name] is a 64 y.o. female who presents for a six-month follow-up office visit.   . She has a past history of palpitations and a history of hypercholesterolemia. In the past she has also had problems with low blood pressure and orthostatic hypotension. . The patient has been experiencing some occasional palpitations. . Her last echocardiogram on 01/06/08 and showed normal ejection fraction of 55-60% and mild mitral regurgitation. Her last chest x-ray in 2009 showed borderline cardiomegaly. After her last visit she had a Myoview stress test on 08/28/14 which showed no ischemia and normal left ventricular function. She and her husband have 2 children. 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 She and her husband are selling their house and will be moving to [place].  They are building a house which will be completed in about January 2017.[name] Since last visit she has not been experiencing any exertional chest pain.  She has had occasional tachycardia which responds to an extra half metoprolol.  She has had a dry cough.  She has a history of reflux and a history of 2 prior Nissen fundoplication procedures of her hiatal hernia.   Past Medical History  Diagnosis Date  . Palpitations   . Hypercholesterolemia   . GERD (gastroesophageal reflux disease)   . Anxiety and depression   . Migraines   . Osteopenia     RLE tib fx 03/2012 following fall    Past Surgical History  Procedure Laterality Date  . Other surgical history      gastroesophageal surgery x 2 for reflx  .  Tonsillectomy  1977  . Cesarean section  1982  . Nissen fundlaplacation  2004, redo 2009  . Hernia repair    . Cervical fusion       Current Outpatient Prescriptions  Medication Sig Dispense Refill  . acetaminophen (TYLENOL) 325 MG tablet Take 650 mg by mouth as needed for headache.     [name] aspirin 81 MG tablet Take 81 mg by mouth daily.      [name] atorvastatin (LIPITOR) 20 MG tablet TAKE 1 TABLET BY MOUTH EVERY DAY 90 tablet 1  . calcium-vitamin D (OSCAL 500/200 D-3) 500-200 MG-UNIT per tablet Take 2 tablets by mouth 2 (two) times daily.    . cetirizine (ZYRTEC) 10 MG tablet TAKE 1 TABLET (10 MG TOTAL) BY MOUTH DAILY. 30 tablet 11  . Coenzyme Q10 (COQ-10 PO) Take 1 capsule by mouth daily.     [name] dexlansoprazole (DEXILANT) 60 MG capsule Take 60 mg by mouth daily.    . meclizine (ANTIVERT) 25 MG tablet Take 1 tablet (25 mg total) by mouth as needed. 30 tablet 0  . metoprolol (LOPRESSOR) 50 MG tablet TAKE 1 TABLET BY MOUTH 2 TIMES DAILY. 180 tablet 1  . multivitamin (THERAGRAN) per tablet Take 1 tablet by mouth daily.      . risedronate (ACTONEL) 35 MG tablet TAKE 1 TABLET BY MOUTH EVERY 7 DAYS WITH WATER ON EMPTY STOMACH, THEN NO FOOD, STAY UPRIGHT 30MINUTE 4  tablet 11   No current facility-administered medications for this visit.    Allergies:   Codeine; Penicillins; and Simvastatin    Social History:  The patient  reports that she has never smoked. She does not have any smokeless tobacco history on file. She reports that she does not drink alcohol or use illicit drugs.   Family History:  The patient's family history includes Alzheimer's disease in her father and mother; Congestive Heart Failure in her father; Heart attack in her father, maternal grandfather, maternal grandmother, paternal grandfather, and paternal grandmother; Heart disease in her father; Heart failure in her father; Hyperlipidemia in her father; Hypertension in her other; Prostate cancer in her father; Stroke in her paternal  grandmother.    ROS:  Please see the history of present illness.   Otherwise, review of systems are positive for none.   All other systems are reviewed and negative.    PHYSICAL EXAM: VS:  BP 110/70 mmHg  Pulse 60  Ht 5\' 4"  (1.626 m)  Wt 139 lb (63.05 kg)  BMI 23.85 kg/m2 , BMI Body mass index is 23.85 kg/(m^2). GEN: Well nourished, well developed, in no acute distress HEENT: normal Neck: no JVD, carotid bruits, or masses Cardiac: RRR; no murmurs, rubs, or gallops,no edema  Respiratory:  clear to auscultation bilaterally, normal work of breathing GI: soft, nontender, nondistended, + BS MS: no deformity or atrophy Skin: warm and dry, no rash Neuro:  Strength and sensation are intact Psych: euthymic mood, full affect   EKG:  EKG is not ordered today.    Recent Labs: 04/14/2014: Hemoglobin 14.2; Platelets 250.0; TSH 1.04 03/04/2015: ALT 14; BUN 19; Creatinine, Ser 0.72; Potassium 4.0; Sodium 139    Lipid Panel    Component Value Date/Time   CHOL 164 03/04/2015 0824   TRIG 104.0 03/04/2015 0824   HDL 33.80* 03/04/2015 0824   CHOLHDL 5 03/04/2015 0824   VLDL 20.8 03/04/2015 0824   LDLCALC 109* 03/04/2015 0824      Wt Readings from Last 3 Encounters:  03/11/15 139 lb (63.05 kg)  08/27/14 145 lb (65.772 kg)  08/24/14 146 lb 12.8 oz (66.588 kg)        ASSESSMENT AND PLAN:  1. history of palpitations 2. Hypercholesterolemia 3. GERD 4. Osteoarthritis 5. history of nocturnal leg cramps 6. Chest discomfort with left arm discomfort.  On 08/28/14 she had a Myoview stress test which showed no ischemia and her ejection fraction was 55% 7.  Chronic dry cough probably secondary to her GERD.  She had a chest x-ray in November 2015 which was normal  Current medicines are reviewed at length with the patient today.  The patient does not have concerns regarding medicines.  The following changes have been made:  no change  Labs/ tests ordered today include:   Orders Placed  This Encounter  Procedures  . Lipid panel  . Basic metabolic panel  . Hepatic function panel    Disposition: Continue current medication.  Recheck in 6 months for office visit lipid panel hepatic function panel and basal metabolic panel.  Berna Spare MD 03/11/2015 6:12 PM    [facility] [address], [place], [place]  [address] Phone: [phone]; Fax: [phone]

## 2015-04-21 ENCOUNTER — Encounter: Payer: BC Managed Care – PPO | Admitting: Internal Medicine

## 2015-07-02 ENCOUNTER — Encounter: Payer: Self-pay | Admitting: Cardiology

## 2015-08-10 ENCOUNTER — Telehealth: Payer: Self-pay | Admitting: Cardiology

## 2015-08-10 NOTE — Telephone Encounter (Signed)
Will forward to  Dr. Brackbill for review 

## 2015-08-10 NOTE — Telephone Encounter (Signed)
New Message  This message is to inform you that we have made 3 attempts to contact the patient. Found that on 12/16 pt canceled appt Reason : Patient (12/16@ 3:23pm pt calling to cancel she is moving ).  Will remove the patient from our recall list at this time    Jarold Motto [facility]

## 2015-08-10 NOTE — Telephone Encounter (Signed)
okay

## 2015-09-08 ENCOUNTER — Other Ambulatory Visit: Payer: BC Managed Care – PPO

## 2015-09-13 ENCOUNTER — Ambulatory Visit: Payer: BC Managed Care – PPO | Admitting: Cardiology

## 2015-10-02 ENCOUNTER — Other Ambulatory Visit: Payer: Self-pay | Admitting: Internal Medicine

## 2015-10-13 ENCOUNTER — Other Ambulatory Visit: Payer: Self-pay | Admitting: Internal Medicine

## 2016-02-26 DIAGNOSIS — I471 Supraventricular tachycardia: Secondary | ICD-10-CM | POA: Insufficient documentation

## 2016-04-12 ENCOUNTER — Encounter: Payer: Self-pay | Admitting: Sports Medicine

## 2016-04-12 ENCOUNTER — Ambulatory Visit (INDEPENDENT_AMBULATORY_CARE_PROVIDER_SITE_OTHER): Payer: Medicare Other | Admitting: Sports Medicine

## 2016-04-12 VITALS — Resp 16 | Ht 62.0 in | Wt 145.0 lb

## 2016-04-12 DIAGNOSIS — L603 Nail dystrophy: Secondary | ICD-10-CM | POA: Diagnosis not present

## 2016-04-12 DIAGNOSIS — M79674 Pain in right toe(s): Secondary | ICD-10-CM

## 2016-04-12 DIAGNOSIS — B351 Tinea unguium: Secondary | ICD-10-CM

## 2016-04-12 DIAGNOSIS — M79675 Pain in left toe(s): Secondary | ICD-10-CM

## 2016-04-12 NOTE — Progress Notes (Signed)
Subjective: Tina [name] is a 65 y.o. female patient seen today in office with complaint of painful thickened and discolored nails L>R hallux. Patient is desiring treatment for nail changes; has tried OTC topicals/Medication Nurse, children's) in the past with no improvement. Reports that nails are becoming difficult to manage because of the thickness. Admits to history of fracturing left big toe and ridges growing in left hallux nail and losing the nail once before. Patient has no other pedal complaints at this time.   Patient Active Problem List   Diagnosis Date Noted  . SVT (supraventricular tachycardia) ([place]) 02/26/2016  . Chest pain of unknown etiology 08/24/2014  . Osteoporosis, post-menopausal   . Leg pain, right 03/20/2012  . Postural dizziness 02/01/2011  . Anxiety and depression 02/01/2011  . Heart palpitations 10/13/2010  . GERD (gastroesophageal reflux disease)   . Hypercholesterolemia     Current Outpatient Prescriptions on File Prior to Visit  Medication Sig Dispense Refill  . acetaminophen (TYLENOL) 325 MG tablet Take 650 mg by mouth as needed for headache.     [name] aspirin 81 MG tablet Take 81 mg by mouth daily.      [name] atorvastatin (LIPITOR) 20 MG tablet TAKE 1 TABLET BY MOUTH EVERY DAY 90 tablet 1  . calcium-vitamin D (OSCAL 500/200 D-3) 500-200 MG-UNIT per tablet Take 2 tablets by mouth 2 (two) times daily.    . cetirizine (ZYRTEC) 10 MG tablet TAKE 1 TABLET (10 MG TOTAL) BY MOUTH DAILY. 30 tablet 11  . Coenzyme Q10 (COQ-10 PO) Take 1 capsule by mouth daily.     [name] dexlansoprazole (DEXILANT) 60 MG capsule Take 60 mg by mouth daily.    . meclizine (ANTIVERT) 25 MG tablet Take 1 tablet (25 mg total) by mouth as needed. 30 tablet 0  . metoprolol (LOPRESSOR) 50 MG tablet TAKE 1 TABLET BY MOUTH 2 TIMES DAILY. 180 tablet 1  . multivitamin (THERAGRAN) per tablet Take 1 tablet by mouth daily.      . risedronate (ACTONEL) 35 MG tablet TAKE 1 TABLET BY MOUTH EVERY 7 DAYS WITH WATER ON  EMPTY STOMACH, THEN NO FOOD, STAY UPRIGHT 30MINUTE 4 tablet 11   No current facility-administered medications on file prior to visit.     Allergies  Allergen Reactions  . Codeine     nausea nausea  . Penicillins     rash rash  . Simvastatin     Leg pain Leg pain    Objective: Physical Exam  General: Well developed, nourished, no acute distress, awake, alert and oriented x 3  Vascular: Dorsalis pedis artery 2/4 bilateral, Posterior tibial artery 1/4 bilateral, skin temperature warm to warm proximal to distal bilateral lower extremities, mild varicosities, pedal hair present bilateral.  Neurological: Gross sensation present via light touch bilateral.   Dermatological: Skin is warm, dry, and supple bilateral, Nails 1-10 are tender, short thick, and discolored with mild subungal debris with left>right hallux nails most involved, no webspace macerations present bilateral, no open lesions present bilateral, no callus/corns/hyperkeratotic tissue present bilateral. No signs of infection bilateral.  Musculoskeletal:Asymptomatic hammertoe boney deformities noted bilateral. Muscular strength within normal limits without painon range of motion. No pain with calf compression bilateral.  Assessment and Plan:  Problem List Items Addressed This Visit    None    Visit Diagnoses    Dermatophytosis of nail    -  Primary   probable   Relevant Orders   Fungus Culture with Smear   Nail dystrophy  Toe pain, bilateral          -Examined patient -Discussed treatment options for painful dystrophic nails  -Fungal culture was obtained from bilateral hallux nails and sent to [facility] lab -Patient to return in 4 weeks for follow up evaluation and discussion of fungal culture results or sooner if symptoms worsen.  Landis Martins, DPM

## 2016-05-10 ENCOUNTER — Encounter: Payer: Self-pay | Admitting: Sports Medicine

## 2016-05-10 ENCOUNTER — Ambulatory Visit (INDEPENDENT_AMBULATORY_CARE_PROVIDER_SITE_OTHER): Payer: Medicare Other | Admitting: Sports Medicine

## 2016-05-10 DIAGNOSIS — L603 Nail dystrophy: Secondary | ICD-10-CM

## 2016-05-10 DIAGNOSIS — M79674 Pain in right toe(s): Secondary | ICD-10-CM

## 2016-05-10 DIAGNOSIS — M79675 Pain in left toe(s): Secondary | ICD-10-CM | POA: Diagnosis not present

## 2016-05-10 NOTE — Patient Instructions (Signed)
Tea tree oil to nails daily after using nail file Vinegar soaks 1 cup of white distilled vinegar to 8 cups of warm water once a week

## 2016-05-10 NOTE — Progress Notes (Signed)
Subjective: Tina [name] is a 65 y.o. female patient seen today in office for fungal culture results. Patient has no other pedal complaints at this time.   Patient Active Problem List   Diagnosis Date Noted  . SVT (supraventricular tachycardia) ([place]) 02/26/2016  . Chest pain of unknown etiology 08/24/2014  . Osteoporosis, post-menopausal   . Leg pain, right 03/20/2012  . Postural dizziness 02/01/2011  . Anxiety and depression 02/01/2011  . Heart palpitations 10/13/2010  . GERD (gastroesophageal reflux disease)   . Hypercholesterolemia     Current Outpatient Prescriptions on File Prior to Visit  Medication Sig Dispense Refill  . acetaminophen (TYLENOL) 325 MG tablet Take 650 mg by mouth as needed for headache.     [name] aspirin 81 MG tablet Take 81 mg by mouth daily.      [name] atorvastatin (LIPITOR) 20 MG tablet TAKE 1 TABLET BY MOUTH EVERY DAY 90 tablet 1  . calcium-vitamin D (OSCAL 500/200 D-3) 500-200 MG-UNIT per tablet Take 2 tablets by mouth 2 (two) times daily.    . cetirizine (ZYRTEC) 10 MG tablet TAKE 1 TABLET (10 MG TOTAL) BY MOUTH DAILY. 30 tablet 11  . Coenzyme Q10 (COQ-10 PO) Take 1 capsule by mouth daily.     [name] dexlansoprazole (DEXILANT) 60 MG capsule Take 60 mg by mouth daily.    . meclizine (ANTIVERT) 25 MG tablet Take 1 tablet (25 mg total) by mouth as needed. 30 tablet 0  . metoprolol (LOPRESSOR) 50 MG tablet TAKE 1 TABLET BY MOUTH 2 TIMES DAILY. 180 tablet 1  . multivitamin (THERAGRAN) per tablet Take 1 tablet by mouth daily.      . risedronate (ACTONEL) 35 MG tablet TAKE 1 TABLET BY MOUTH EVERY 7 DAYS WITH WATER ON EMPTY STOMACH, THEN NO FOOD, STAY UPRIGHT 30MINUTE 4 tablet 11   No current facility-administered medications on file prior to visit.     Allergies  Allergen Reactions  . Codeine     nausea nausea  . Penicillins     rash rash  . Simvastatin     Leg pain Leg pain    Objective: Physical Exam  General: Well developed, nourished, no acute distress,  awake, alert and oriented x 3  Vascular: Dorsalis pedis artery 2/4 bilateral, Posterior tibial artery 1/4 bilateral, skin temperature warm to warm proximal to distal bilateral lower extremities, mild varicosities, scant pedal hair present bilateral.  Neurological: Gross sensation present via light touch bilateral.   Dermatological: Skin is warm, dry, and supple bilateral, Nails 1-10 are tender, short thick, and discolored with mild subungal debris left over right hallux, mostly involved, no webspace macerations present bilateral, no open lesions present bilateral, no callus/corns/hyperkeratotic tissue present bilateral. No signs of infection bilateral.  Musculoskeletal: Asymptomatic hammertoe deformities noted bilateral. Muscular strength within normal limits without painon range of motion. No pain with calf compression bilateral.  Fungal culture suggestive of treated onychomycosis vs allergy with spongiotic nail changes  Assessment and Plan:  Problem List Items Addressed This Visit    None    Visit Diagnoses    Nail dystrophy    -  Primary   Toe pain, bilateral         -Examined patient -Discussed treatment options for painful mycotic nails  -Patient opt for topical; Recommend Tea tree oil to nails daily after falling and once weekly vinegar soaks; Patient states that she will try this first before considering having the nail removed -Advised good hygiene habits -Patient to return as needed  for follow up evaluation or sooner if symptoms worsen.  Landis Martins, DPM

## 2016-08-10 ENCOUNTER — Other Ambulatory Visit: Payer: Self-pay | Admitting: Family Medicine

## 2016-08-10 DIAGNOSIS — R928 Other abnormal and inconclusive findings on diagnostic imaging of breast: Secondary | ICD-10-CM

## 2016-08-30 ENCOUNTER — Ambulatory Visit
Admission: RE | Admit: 2016-08-30 | Discharge: 2016-08-30 | Disposition: A | Discharge status: Home / Self Care | Payer: Medicare Other | Source: Ambulatory Visit | Attending: Family Medicine | Admitting: Family Medicine

## 2016-08-30 DIAGNOSIS — R928 Other abnormal and inconclusive findings on diagnostic imaging of breast: Secondary | ICD-10-CM

## 2016-09-21 ENCOUNTER — Other Ambulatory Visit: Payer: Self-pay | Admitting: General Surgery

## 2016-09-21 DIAGNOSIS — N6489 Other specified disorders of breast: Secondary | ICD-10-CM

## 2016-10-09 ENCOUNTER — Other Ambulatory Visit: Payer: Self-pay | Admitting: General Surgery

## 2016-10-09 DIAGNOSIS — N6489 Other specified disorders of breast: Secondary | ICD-10-CM

## 2016-10-15 DIAGNOSIS — R928 Other abnormal and inconclusive findings on diagnostic imaging of breast: Secondary | ICD-10-CM

## 2016-10-15 HISTORY — DX: Other abnormal and inconclusive findings on diagnostic imaging of breast: R92.8

## 2016-10-30 ENCOUNTER — Other Ambulatory Visit: Payer: Self-pay | Admitting: General Surgery

## 2016-10-30 ENCOUNTER — Encounter (HOSPITAL_BASED_OUTPATIENT_CLINIC_OR_DEPARTMENT_OTHER): Payer: Self-pay | Admitting: *Deleted

## 2016-10-31 ENCOUNTER — Encounter (HOSPITAL_BASED_OUTPATIENT_CLINIC_OR_DEPARTMENT_OTHER): Payer: Self-pay | Admitting: *Deleted

## 2016-10-31 NOTE — Pre-Procedure Instructions (Signed)
To come for EKG and to pick up Boost Breeze 8 oz. - to drink by 0515 DOS

## 2016-11-06 ENCOUNTER — Encounter (HOSPITAL_BASED_OUTPATIENT_CLINIC_OR_DEPARTMENT_OTHER)
Admission: RE | Admit: 2016-11-06 | Discharge: 2016-11-06 | Disposition: A | Discharge status: Home / Self Care | Payer: Medicare Other | Source: Ambulatory Visit | Attending: General Surgery | Admitting: General Surgery

## 2016-11-06 ENCOUNTER — Ambulatory Visit
Admission: RE | Admit: 2016-11-06 | Discharge: 2016-11-06 | Disposition: A | Discharge status: Home / Self Care | Payer: Medicare Other | Source: Ambulatory Visit | Attending: General Surgery | Admitting: General Surgery

## 2016-11-06 DIAGNOSIS — K219 Gastro-esophageal reflux disease without esophagitis: Secondary | ICD-10-CM | POA: Diagnosis not present

## 2016-11-06 DIAGNOSIS — Z888 Allergy status to other drugs, medicaments and biological substances status: Secondary | ICD-10-CM | POA: Diagnosis not present

## 2016-11-06 DIAGNOSIS — Z79899 Other long term (current) drug therapy: Secondary | ICD-10-CM | POA: Diagnosis not present

## 2016-11-06 DIAGNOSIS — N6489 Other specified disorders of breast: Secondary | ICD-10-CM

## 2016-11-06 DIAGNOSIS — N6091 Unspecified benign mammary dysplasia of right breast: Secondary | ICD-10-CM | POA: Diagnosis not present

## 2016-11-06 DIAGNOSIS — Z7982 Long term (current) use of aspirin: Secondary | ICD-10-CM | POA: Diagnosis not present

## 2016-11-06 DIAGNOSIS — N6310 Unspecified lump in the right breast, unspecified quadrant: Secondary | ICD-10-CM | POA: Diagnosis present

## 2016-11-06 DIAGNOSIS — I4891 Unspecified atrial fibrillation: Secondary | ICD-10-CM | POA: Diagnosis not present

## 2016-11-06 DIAGNOSIS — Z885 Allergy status to narcotic agent status: Secondary | ICD-10-CM | POA: Diagnosis not present

## 2016-11-06 DIAGNOSIS — Z880 Allergy status to penicillin: Secondary | ICD-10-CM | POA: Diagnosis not present

## 2016-11-06 NOTE — Progress Notes (Signed)
Pt instructed to drink Boost by 0500 day of surgery with teach back method.

## 2016-11-08 ENCOUNTER — Encounter (HOSPITAL_BASED_OUTPATIENT_CLINIC_OR_DEPARTMENT_OTHER)
Admission: RE | Disposition: A | Discharge status: Home / Self Care | Payer: Self-pay | Source: Ambulatory Visit | Attending: General Surgery

## 2016-11-08 ENCOUNTER — Encounter (HOSPITAL_BASED_OUTPATIENT_CLINIC_OR_DEPARTMENT_OTHER): Payer: Self-pay | Admitting: *Deleted

## 2016-11-08 ENCOUNTER — Ambulatory Visit (HOSPITAL_BASED_OUTPATIENT_CLINIC_OR_DEPARTMENT_OTHER): Payer: Medicare Other | Admitting: Anesthesiology

## 2016-11-08 ENCOUNTER — Ambulatory Visit (HOSPITAL_BASED_OUTPATIENT_CLINIC_OR_DEPARTMENT_OTHER)
Admission: RE | Admit: 2016-11-08 | Discharge: 2016-11-08 | Disposition: A | Discharge status: Home / Self Care | Payer: Medicare Other | Source: Ambulatory Visit | Attending: General Surgery | Admitting: General Surgery

## 2016-11-08 ENCOUNTER — Ambulatory Visit
Admission: RE | Admit: 2016-11-08 | Discharge: 2016-11-08 | Disposition: A | Discharge status: Home / Self Care | Payer: Medicare Other | Source: Ambulatory Visit | Attending: General Surgery | Admitting: General Surgery

## 2016-11-08 DIAGNOSIS — I4891 Unspecified atrial fibrillation: Secondary | ICD-10-CM | POA: Insufficient documentation

## 2016-11-08 DIAGNOSIS — N6091 Unspecified benign mammary dysplasia of right breast: Secondary | ICD-10-CM | POA: Insufficient documentation

## 2016-11-08 DIAGNOSIS — Z885 Allergy status to narcotic agent status: Secondary | ICD-10-CM | POA: Insufficient documentation

## 2016-11-08 DIAGNOSIS — N6489 Other specified disorders of breast: Secondary | ICD-10-CM

## 2016-11-08 DIAGNOSIS — K219 Gastro-esophageal reflux disease without esophagitis: Secondary | ICD-10-CM | POA: Insufficient documentation

## 2016-11-08 DIAGNOSIS — Z79899 Other long term (current) drug therapy: Secondary | ICD-10-CM | POA: Insufficient documentation

## 2016-11-08 DIAGNOSIS — Z7982 Long term (current) use of aspirin: Secondary | ICD-10-CM | POA: Insufficient documentation

## 2016-11-08 DIAGNOSIS — Z888 Allergy status to other drugs, medicaments and biological substances status: Secondary | ICD-10-CM | POA: Insufficient documentation

## 2016-11-08 DIAGNOSIS — Z880 Allergy status to penicillin: Secondary | ICD-10-CM | POA: Insufficient documentation

## 2016-11-08 HISTORY — DX: Other abnormal and inconclusive findings on diagnostic imaging of breast: R92.8

## 2016-11-08 HISTORY — DX: Supraventricular tachycardia, unspecified: I47.10

## 2016-11-08 HISTORY — DX: Cardiac arrhythmia, unspecified: I49.9

## 2016-11-08 HISTORY — DX: Stiffness of unspecified joint, not elsewhere classified: M25.60

## 2016-11-08 HISTORY — DX: Other seasonal allergic rhinitis: J30.2

## 2016-11-08 HISTORY — DX: Dental restoration status: Z98.811

## 2016-11-08 HISTORY — PX: RADIOACTIVE SEED GUIDED EXCISIONAL BREAST BIOPSY: SHX6490

## 2016-11-08 HISTORY — DX: Personal history of other diseases of the digestive system: Z87.19

## 2016-11-08 HISTORY — DX: Supraventricular tachycardia: I47.1

## 2016-11-08 SURGERY — RADIOACTIVE SEED GUIDED BREAST BIOPSY
Anesthesia: General | Site: Breast | Laterality: Right

## 2016-11-08 MED ORDER — BUPIVACAINE HCL (PF) 0.25 % IJ SOLN
INTRAMUSCULAR | Status: DC | PRN
  Administered 2016-11-08: 10 mL

## 2016-11-08 MED ORDER — ACETAMINOPHEN 500 MG PO TABS
ORAL_TABLET | ORAL | Status: AC
  Filled 2016-11-08: qty 2

## 2016-11-08 MED ORDER — EPHEDRINE SULFATE 50 MG/ML IJ SOLN
INTRAMUSCULAR | Status: DC | PRN
  Administered 2016-11-08: 10 mg via INTRAVENOUS
  Administered 2016-11-08: 5 mg via INTRAVENOUS
  Administered 2016-11-08: 10 mg via INTRAVENOUS

## 2016-11-08 MED ORDER — LIDOCAINE 2% (20 MG/ML) 5 ML SYRINGE
INTRAMUSCULAR | Status: DC | PRN
  Administered 2016-11-08: 100 mg via INTRAVENOUS

## 2016-11-08 MED ORDER — PROMETHAZINE HCL 12.5 MG PO TABS: 12.5000 mg | ORAL_TABLET | Freq: Four times a day (QID) | ORAL | 0 refills | Status: DC | PRN

## 2016-11-08 MED ORDER — CIPROFLOXACIN IN D5W 400 MG/200ML IV SOLN
400.0000 mg | INTRAVENOUS | Status: AC
  Administered 2016-11-08: 400 mg via INTRAVENOUS

## 2016-11-08 MED ORDER — EPHEDRINE 5 MG/ML INJ
INTRAVENOUS | Status: AC
  Filled 2016-11-08: qty 10

## 2016-11-08 MED ORDER — CHLORHEXIDINE GLUCONATE CLOTH 2 % EX PADS: 6.0000 | MEDICATED_PAD | Freq: Once | CUTANEOUS | Status: DC

## 2016-11-08 MED ORDER — ONDANSETRON 4 MG PO TBDP
ORAL_TABLET | ORAL | Status: AC
  Filled 2016-11-08: qty 1

## 2016-11-08 MED ORDER — ACETAMINOPHEN 500 MG PO TABS
1000.0000 mg | ORAL_TABLET | ORAL | Status: AC
  Administered 2016-11-08: 1000 mg via ORAL

## 2016-11-08 MED ORDER — FENTANYL CITRATE (PF) 100 MCG/2ML IJ SOLN
50.0000 ug | INTRAMUSCULAR | Status: DC | PRN
  Administered 2016-11-08: 50 ug via INTRAVENOUS
  Administered 2016-11-08: 25 ug via INTRAVENOUS

## 2016-11-08 MED ORDER — PROMETHAZINE HCL 25 MG/ML IJ SOLN: 6.2500 mg | INTRAMUSCULAR | Status: DC | PRN

## 2016-11-08 MED ORDER — LACTATED RINGERS IV SOLN
INTRAVENOUS | Status: DC
  Administered 2016-11-08: 08:00:00 via INTRAVENOUS

## 2016-11-08 MED ORDER — GABAPENTIN 300 MG PO CAPS
300.0000 mg | ORAL_CAPSULE | ORAL | Status: AC
  Administered 2016-11-08: 300 mg via ORAL

## 2016-11-08 MED ORDER — DEXAMETHASONE SODIUM PHOSPHATE 4 MG/ML IJ SOLN
INTRAMUSCULAR | Status: DC | PRN
  Administered 2016-11-08: 10 mg via INTRAVENOUS

## 2016-11-08 MED ORDER — PROPOFOL 10 MG/ML IV BOLUS
INTRAVENOUS | Status: DC | PRN
  Administered 2016-11-08: 150 mg via INTRAVENOUS

## 2016-11-08 MED ORDER — OXYCODONE HCL 5 MG PO TABS: 5.0000 mg | ORAL_TABLET | Freq: Once | ORAL | Status: DC | PRN

## 2016-11-08 MED ORDER — OXYCODONE HCL 5 MG/5ML PO SOLN: 5.0000 mg | Freq: Once | ORAL | Status: DC | PRN

## 2016-11-08 MED ORDER — MEPERIDINE HCL 25 MG/ML IJ SOLN: 6.2500 mg | INTRAMUSCULAR | Status: DC | PRN

## 2016-11-08 MED ORDER — PROPOFOL 500 MG/50ML IV EMUL
INTRAVENOUS | Status: AC
  Filled 2016-11-08: qty 50

## 2016-11-08 MED ORDER — FENTANYL CITRATE (PF) 100 MCG/2ML IJ SOLN: 25.0000 ug | INTRAMUSCULAR | Status: DC | PRN

## 2016-11-08 MED ORDER — MIDAZOLAM HCL 2 MG/2ML IJ SOLN
INTRAMUSCULAR | Status: AC
  Filled 2016-11-08: qty 2

## 2016-11-08 MED ORDER — FENTANYL CITRATE (PF) 100 MCG/2ML IJ SOLN
INTRAMUSCULAR | Status: AC
  Filled 2016-11-08: qty 2

## 2016-11-08 MED ORDER — SCOPOLAMINE 1 MG/3DAYS TD PT72
1.0000 | MEDICATED_PATCH | Freq: Once | TRANSDERMAL | Status: AC | PRN
  Administered 2016-11-08: 1 via TRANSDERMAL

## 2016-11-08 MED ORDER — CIPROFLOXACIN IN D5W 400 MG/200ML IV SOLN
INTRAVENOUS | Status: AC
  Filled 2016-11-08: qty 200

## 2016-11-08 MED ORDER — ONDANSETRON 4 MG PO TBDP
4.0000 mg | ORAL_TABLET | Freq: Once | ORAL | Status: AC
  Administered 2016-11-08: 4 mg via ORAL

## 2016-11-08 MED ORDER — GABAPENTIN 300 MG PO CAPS
ORAL_CAPSULE | ORAL | Status: AC
  Filled 2016-11-08: qty 1

## 2016-11-08 MED ORDER — LACTATED RINGERS IV SOLN: INTRAVENOUS | Status: DC

## 2016-11-08 MED ORDER — MIDAZOLAM HCL 2 MG/2ML IJ SOLN
1.0000 mg | INTRAMUSCULAR | Status: DC | PRN
  Administered 2016-11-08: 2 mg via INTRAVENOUS

## 2016-11-08 MED ORDER — BUPIVACAINE HCL (PF) 0.25 % IJ SOLN
INTRAMUSCULAR | Status: AC
  Filled 2016-11-08: qty 30

## 2016-11-08 SURGICAL SUPPLY — 59 items
ADH SKN CLS APL DERMABOND .7 (GAUZE/BANDAGES/DRESSINGS) ×1
BINDER BREAST LRG (GAUZE/BANDAGES/DRESSINGS) ×3 IMPLANT
BINDER BREAST MEDIUM (GAUZE/BANDAGES/DRESSINGS) IMPLANT
BINDER BREAST XLRG (GAUZE/BANDAGES/DRESSINGS) IMPLANT
BINDER BREAST XXLRG (GAUZE/BANDAGES/DRESSINGS) IMPLANT
BLADE SURG 15 STRL LF DISP TIS (BLADE) ×1 IMPLANT
BLADE SURG 15 STRL SS (BLADE) ×3
CANISTER SUC SOCK COL 7IN (MISCELLANEOUS) IMPLANT
CANISTER SUCT 1200ML W/VALVE (MISCELLANEOUS) IMPLANT
CHLORAPREP W/TINT 26ML (MISCELLANEOUS) ×3 IMPLANT
CLIP TI WIDE RED SMALL 6 (CLIP) IMPLANT
CLOSURE WOUND 1/2 X4 (GAUZE/BANDAGES/DRESSINGS) ×1
COVER BACK TABLE 60X90IN (DRAPES) ×3 IMPLANT
COVER MAYO STAND STRL (DRAPES) ×3 IMPLANT
COVER PROBE W GEL 5X96 (DRAPES) ×3 IMPLANT
DECANTER SPIKE VIAL GLASS SM (MISCELLANEOUS) IMPLANT
DERMABOND ADVANCED (GAUZE/BANDAGES/DRESSINGS) ×2
DERMABOND ADVANCED .7 DNX12 (GAUZE/BANDAGES/DRESSINGS) ×1 IMPLANT
DEVICE DUBIN W/COMP PLATE 8390 (MISCELLANEOUS) ×3 IMPLANT
DRAPE LAPAROSCOPIC ABDOMINAL (DRAPES) ×3 IMPLANT
DRAPE UTILITY XL STRL (DRAPES) ×3 IMPLANT
DRSG TEGADERM 4X4.75 (GAUZE/BANDAGES/DRESSINGS) IMPLANT
ELECT COATED BLADE 2.86 ST (ELECTRODE) ×3 IMPLANT
ELECT REM PT RETURN 9FT ADLT (ELECTROSURGICAL) ×3
ELECTRODE REM PT RTRN 9FT ADLT (ELECTROSURGICAL) ×1 IMPLANT
GAUZE SPONGE 4X4 12PLY STRL LF (GAUZE/BANDAGES/DRESSINGS) IMPLANT
GLOVE BIO SURGEON STRL SZ7 (GLOVE) ×6 IMPLANT
GLOVE BIOGEL PI IND STRL 7.0 (GLOVE) IMPLANT
GLOVE BIOGEL PI IND STRL 7.5 (GLOVE) ×1 IMPLANT
GLOVE BIOGEL PI INDICATOR 7.0 (GLOVE) ×4
GLOVE BIOGEL PI INDICATOR 7.5 (GLOVE) ×2
GLOVE SURG SS PI 6.5 STRL IVOR (GLOVE) ×3 IMPLANT
GOWN STRL REUS W/ TWL LRG LVL3 (GOWN DISPOSABLE) ×2 IMPLANT
GOWN STRL REUS W/TWL LRG LVL3 (GOWN DISPOSABLE) ×6
HEMOSTAT ARISTA ABSORB 3G PWDR (MISCELLANEOUS) IMPLANT
ILLUMINATOR WAVEGUIDE N/F (MISCELLANEOUS) IMPLANT
KIT MARKER MARGIN INK (KITS) ×3 IMPLANT
LIGHT WAVEGUIDE WIDE FLAT (MISCELLANEOUS) IMPLANT
NEEDLE HYPO 25X1 1.5 SAFETY (NEEDLE) ×3 IMPLANT
NS IRRIG 1000ML POUR BTL (IV SOLUTION) IMPLANT
PACK BASIN DAY SURGERY FS (CUSTOM PROCEDURE TRAY) ×3 IMPLANT
PENCIL BUTTON HOLSTER BLD 10FT (ELECTRODE) ×3 IMPLANT
SLEEVE SCD COMPRESS KNEE MED (MISCELLANEOUS) ×3 IMPLANT
SPONGE LAP 4X18 X RAY DECT (DISPOSABLE) ×3 IMPLANT
STRIP CLOSURE SKIN 1/2X4 (GAUZE/BANDAGES/DRESSINGS) ×2 IMPLANT
SUT MNCRL AB 4-0 PS2 18 (SUTURE) IMPLANT
SUT MON AB 5-0 PS2 18 (SUTURE) ×2 IMPLANT
SUT SILK 2 0 SH (SUTURE) IMPLANT
SUT VIC AB 2-0 SH 27 (SUTURE) ×3
SUT VIC AB 2-0 SH 27XBRD (SUTURE) ×1 IMPLANT
SUT VIC AB 3-0 SH 27 (SUTURE) ×3
SUT VIC AB 3-0 SH 27X BRD (SUTURE) ×1 IMPLANT
SUT VIC AB 5-0 PS2 18 (SUTURE) IMPLANT
SYR CONTROL 10ML LL (SYRINGE) ×3 IMPLANT
TOWEL OR 17X24 6PK STRL BLUE (TOWEL DISPOSABLE) ×3 IMPLANT
TOWEL OR NON WOVEN STRL DISP B (DISPOSABLE) ×3 IMPLANT
TUBE CONNECTING 20'X1/4 (TUBING)
TUBE CONNECTING 20X1/4 (TUBING) IMPLANT
YANKAUER SUCT BULB TIP NO VENT (SUCTIONS) IMPLANT

## 2016-11-08 NOTE — H&P (Signed)
66 yof referred by Dr Kennith Maes for right breast distortion on screening mm. she has no mass or dc. she has no family history or personal history of breast disease. her mm showed a right breast distortion. this was biopsied and is a csl. she is here today to discuss options she has pcn allergy listed but she states she has received and was fine.  Past Surgical History  Breast Biopsy  Right. Cesarean Section - Multiple  Colon Polyp Removal - Colonoscopy  Nissen Fundoplication  Oral Surgery  Tonsillectomy   Diagnostic Studies History Colonoscopy  5-10 years ago Mammogram  within last year Pap Smear  1-5 years ago  Allergies  Codeine Phosphate *ANALGESICS - OPIOID*  Penicillins  Simvastatin *ANTIHYPERLIPIDEMICS*   Medication History Acetaminophen (325MG  Tablet, Oral) Active. Aspirin (81MG  Tablet, Oral) Active. Atorvastatin Calcium (20MG  Tablet, Oral) Active. Calcium-Vitamin D (500-200MG -UNIT Tablet, Oral) Active. ZyrTEC (10MG  Tablet Chewable, Oral) Active. Coenzyme Q10 (Oral) Specific strength unknown - Active. Dexlansoprazole (60MG  Capsule DR, Oral) Active. Meclizine HCl (25MG  Tablet, Oral) Active. Metoprolol Succinate (50MG  Tablet ER, Oral) Active. Theragran-M (Oral) Specific strength unknown - Active. Risedronate Sodium (35MG  Tablet, Oral) Active. Medications Reconciled  Social History  Caffeine use  Coffee, Tea. No alcohol use  No drug use  Tobacco use  Never smoker.  Family History  Heart Disease  Father. Heart disease in female family member before age 27  Melanoma  Father. Migraine Headache  Daughter, Father. Prostate Cancer  Father.  Pregnancy / Birth History  Age at menarche  18 years. Age of menopause  5-50 Grav[place]  4 Length (months) of breastfeeding  3-6 Maternal age  32-30 Para  2  Other Problems  Atrial Fibrillation  Gastroesophageal Reflux Disease  Heart murmur   Review of Systems  General Not  Present- Appetite Loss, Chills, Fatigue, Fever, Night Sweats, Weight Gain and Weight Loss. Skin Not Present- Change in Wart/Mole, Dryness, Hives, Jaundice, New Lesions, Non-Healing Wounds, Rash and Ulcer. HEENT Present- Hearing Loss, Ringing in the Ears, Seasonal Allergies and Wears glasses/contact lenses. Not Present- Earache, Hoarseness, Nose Bleed, Oral Ulcers, Sinus Pain, Sore Throat, Visual Disturbances and Yellow Eyes. Respiratory Not Present- Bloody sputum, Chronic Cough, Difficulty Breathing, Snoring and Wheezing. Cardiovascular Not Present- Chest Pain, Difficulty Breathing Lying Down, Leg Cramps, Palpitations, Rapid Heart Rate, Shortness of Breath and Swelling of Extremities. Gastrointestinal Not Present- Abdominal Pain, Bloating, Bloody Stool, Change in Bowel Habits, Chronic diarrhea, Constipation, Difficulty Swallowing, Excessive gas, Gets full quickly at meals, Hemorrhoids, Indigestion, Nausea, Rectal Pain and Vomiting. Female Genitourinary Not Present- Frequency, Nocturia, Painful Urination, Pelvic Pain and Urgency. Musculoskeletal Not Present- Back Pain, Joint Pain, Joint Stiffness, Muscle Pain, Muscle Weakness and Swelling of Extremities. Neurological Not Present- Decreased Memory, Fainting, Headaches, Numbness, Seizures, Tingling, Tremor, Trouble walking and Weakness. Psychiatric Not Present- Anxiety, Bipolar, Change in Sleep Pattern, Depression, Fearful and Frequent crying. Endocrine Not Present- Cold Intolerance, Excessive Hunger, Hair Changes, Heat Intolerance, Hot flashes and New Diabetes. Hematology Not Present- Blood Thinners, Easy Bruising, Excessive bleeding, Gland problems, HIV and Persistent Infections.  Vitals Weight: 140.4 lb Height: 64.5in Body Surface Area: 1.69 m Body Mass Index: 23.73 kg/m  Temp.: 98.36F  Pulse: 79 (Regular)  BP: 120/70 (Sitting, Left Arm, Standard) Physical Exam  General Mental Status-Alert. Orientation-Oriented X3. Chest and  Lung Exam Chest and lung exam reveals -on auscultation, normal breath sounds, no adventitious sounds and normal vocal resonance. Breast Nipples-No Discharge. Breast Lump-No Palpable Breast Mass. Cardiovascular Cardiovascular examination reveals -normal heart sounds, regular rate  and rhythm with no murmurs. Lymphatic Head & Neck General Head & Neck Lymphatics: Bilateral - Description - Normal. Axillary General Axillary Region: Bilateral - Description - Normal. Note: no Nazlini adenopathy   Assessment & Plan  RADIAL SCAR OF BREAST (N64.89) Story: right breast seed guided excisional biopsy we discussed options including observation and surgery. standard to csl is still excision with about a 10% upgrade rate. we discussed surgery with seed guidance along with risks/recovery.

## 2016-11-08 NOTE — Discharge Instructions (Signed)
[facility] °Office Phone Number [phone] ° °POST OP INSTRUCTIONS ° °Always review your discharge instruction sheet given to you by the facility where your surgery was performed. ° °IF YOU HAVE DISABILITY OR FAMILY LEAVE FORMS, YOU MUST BRING THEM TO THE OFFICE FOR PROCESSING.  DO NOT GIVE THEM TO YOUR DOCTOR. ° °1. A prescription for pain medication may be given to you upon discharge.  Take your pain medication as prescribed, if needed.  If narcotic pain medicine is not needed, then you may take acetaminophen (Tylenol), naprosyn (Alleve) or ibuprofen (Advil) as needed. °2. Take your usually prescribed medications unless otherwise directed °3. If you need a refill on your pain medication, please contact your pharmacy.  They will contact our office to request authorization.  Prescriptions will not be filled after 5pm or on week-ends. °4. You should eat very light the first 24 hours after surgery, such as soup, crackers, pudding, etc.  Resume your normal diet the day after surgery. °5. Most patients will experience some swelling and bruising in the breast.  Ice packs and a good support bra will help.  Wear the breast binder provided or a sports bra for 72 hours day and night.  After that wear a sports bra during the day until you return to the office. Swelling and bruising can take several days to resolve.  °6. It is common to experience some constipation if taking pain medication after surgery.  Increasing fluid intake and taking a stool softener will usually help or prevent this problem from occurring.  A mild laxative (Milk of Magnesia or Miralax) should be taken according to package directions if there are no bowel movements after 48 hours. °7. Unless discharge instructions indicate otherwise, you may remove your bandages 48 hours after surgery and you may shower at that time.  You may have steri-strips (small skin tapes) in place directly over the incision.  These strips should be left on the  skin for 7-10 days and will come off on their own.  If your surgeon used skin glue on the incision, you may shower in 24 hours.  The glue will flake off over the next 2-3 weeks.  Any sutures or staples will be removed at the office during your follow-up visit. °8. ACTIVITIES:  You may resume regular daily activities (gradually increasing) beginning the next day.  Wearing a good support bra or sports bra minimizes pain and swelling.  You may have sexual intercourse when it is comfortable. °a. You may drive when you no longer are taking prescription pain medication, you can comfortably wear a seatbelt, and you can safely maneuver your car and apply brakes. °b. RETURN TO WORK:  ______________________________________________________________________________________ °9. You should see your doctor in the office for a follow-up appointment approximately two weeks after your surgery.  Your doctor’s nurse will typically make your follow-up appointment when she calls you with your pathology report.  Expect your pathology report 3-4 business days after your surgery.  You may call to check if you do not hear from us after three days. °10. OTHER INSTRUCTIONS: _______________________________________________________________________________________________ _____________________________________________________________________________________________________________________________________ °_____________________________________________________________________________________________________________________________________ °_____________________________________________________________________________________________________________________________________ ° °WHEN TO CALL DR WAKEFIELD: °1. Fever over 101.0 °2. Nausea and/or vomiting. °3. Extreme swelling or bruising. °4. Continued bleeding from incision. °5. Increased pain, redness, or drainage from the incision. ° °The clinic staff is available to answer your questions during regular  business hours.  Please don’t hesitate to call and ask to speak to one of the nurses for clinical concerns.  If   you have a medical emergency, go to the nearest emergency room or call 911.  A surgeon from [facility] is always on call at the hospital. ° °For further questions, please visit [email or web address] ° ° ° ° ° °Post Anesthesia Home Care Instructions ° °Activity: °Get plenty of rest for the remainder of the day. A responsible individual must stay with you for 24 hours following the procedure.  °For the next 24 hours, DO NOT: °-Drive a car °-Operate machinery °-Drink alcoholic beverages °-Take any medication unless instructed by your physician °-Make any legal decisions or sign important papers. ° °Meals: °Start with liquid foods such as gelatin or soup. Progress to regular foods as tolerated. Avoid greasy, spicy, heavy foods. If nausea and/or vomiting occur, drink only clear liquids until the nausea and/or vomiting subsides. Call your physician if vomiting continues. ° °Special Instructions/Symptoms: °Your throat may feel dry or sore from the anesthesia or the breathing tube placed in your throat during surgery. If this causes discomfort, gargle with warm salt water. The discomfort should disappear within 24 hours. ° °If you had a scopolamine patch placed behind your ear for the management of post- operative nausea and/or vomiting: ° °1. The medication in the patch is effective for 72 hours, after which it should be removed.  Wrap patch in a tissue and discard in the trash. Wash hands thoroughly with soap and water. °2. You may remove the patch earlier than 72 hours if you experience unpleasant side effects which may include dry mouth, dizziness or visual disturbances. °3. Avoid touching the patch. Wash your hands with soap and water after contact with the patch. °  ° °

## 2016-11-08 NOTE — Op Note (Signed)
Preoperative diagnoses: right breast mass with core biopsy c/w csl0 Postoperative diagnosis: Same as above Procedure:Rightbreast seed guided excisional biopsy Surgeon: Dr. Serita Grammes Anesthesia: Gen. Estimated blood loss: minimal Complications: None Drains: None Specimens:Rightbreast tissue with paint Sponge and needle count correct at completion Disposition to recovery stable  Indications: This is a 8 yof who has right breast mass. Core biopsy c/w csl. We discussed options and elected to proceed with seed guided excisional biopsy. She has seed placed prior to beginning and I had these mammograms in the OR  Procedure: After informed consent was obtained she was then taken to the operating room. She was given cefazolin. Sequential compression devices were on her legs. She was placed under general anesthesia without complication. Her rightbreast was then prepped and draped in the standard sterile surgical fashion. A surgical timeout was then performed.  I located the radioactive seed with the neoprobe. I infiltrated marcaine in the area of the seed. I made a periareolar incision to hide the scar.  I then used the neoprobe to guide the excision of the seed and surrounding tissue.This was confirmed by the neoprobe. This was then taken for mammogram which confirmed removal of the seed and the clip. This was confirmed by radiology. This was then sent to pathology. Hemostasis was observed.I closed the breast tissue with a 2-0 Vicryl. The dermis was closed with 3-0 Vicryl and the skin with 5-0 Monocryl.Dermabond and steristrips were placed on the incision. A breast binder was placed. She was transferred to recovery stable

## 2016-11-08 NOTE — Anesthesia Procedure Notes (Signed)
Procedure Name: LMA Insertion Date/Time: 11/08/2016 8:43 AM Performed by: Lieutenant Diego Pre-anesthesia Checklist: Patient identified, Emergency Drugs available, Suction available and Patient being monitored Patient Re-evaluated:Patient Re-evaluated prior to inductionOxygen Delivery Method: Circle system utilized Preoxygenation: Pre-oxygenation with 100% oxygen Intubation Type: IV induction Ventilation: Mask ventilation without difficulty LMA: LMA inserted LMA Size: 4.0 Number of attempts: 1 Airway Equipment and Method: Bite block Placement Confirmation: positive ETCO2 and breath sounds checked- equal and bilateral Tube secured with: Tape Dental Injury: Teeth and Oropharynx as per pre-operative assessment

## 2016-11-08 NOTE — Interval H&P Note (Signed)
History and Physical Interval Note:  11/08/2016 8:24 AM  Tina [name]  has presented today for surgery, with the diagnosis of ABNORMAL RIGHT BREAST MAMMOGRAM  The various methods of treatment have been discussed with the patient and family. After consideration of risks, benefits and other options for treatment, the patient has consented to  Procedure(s): RADIOACTIVE SEED GUIDED EXCISIONAL RIGHT BREAST BIOPSY (Right) as a surgical intervention .  The patient's history has been reviewed, patient examined, no change in status, stable for surgery.  I have reviewed the patient's chart and labs.  Questions were answered to the patient's satisfaction.     [name]

## 2016-11-08 NOTE — Transfer of Care (Signed)
Immediate Anesthesia Transfer of Care Note  Patient: Tina [name]  Procedure(s) Performed: Procedure(s): RADIOACTIVE SEED GUIDED EXCISIONAL RIGHT BREAST BIOPSY (Right)  Patient Location: PACU  Anesthesia Type:General  Level of Consciousness: sedated  Airway & Oxygen Therapy: Patient Spontanous Breathing and Patient connected to face mask oxygen  Post-op Assessment: Report given to RN and Post -op Vital signs reviewed and stable  Post vital signs: Reviewed and stable  Last Vitals:  Vitals:   11/08/16 0727  BP: 115/67  Pulse: 71  Resp: 16  Temp: 36.7 C    Last Pain:  Vitals:   11/08/16 0727  TempSrc: Oral         Complications: No apparent anesthesia complications

## 2016-11-08 NOTE — Anesthesia Postprocedure Evaluation (Addendum)
Anesthesia Post Note  Patient: Tina [name]  Procedure(s) Performed: Procedure(s) (LRB): RADIOACTIVE SEED GUIDED EXCISIONAL RIGHT BREAST BIOPSY (Right)  Patient location during evaluation: PACU Anesthesia Type: General Level of consciousness: awake and alert Pain management: pain level controlled Vital Signs Assessment: post-procedure vital signs reviewed and stable Respiratory status: spontaneous breathing, nonlabored ventilation, respiratory function stable and patient connected to nasal cannula oxygen Cardiovascular status: blood pressure returned to baseline and stable Postop Assessment: no signs of nausea or vomiting Anesthetic complications: no       Last Vitals:  Vitals:   11/08/16 1000 11/08/16 1030  BP: 121/69 123/65  Pulse: 67 71  Resp: 13 16  Temp: 36.3 C 36.5 C    Last Pain:  Vitals:   11/08/16 1030  TempSrc:   PainSc: 0-No pain                 Effie Berkshire

## 2016-11-08 NOTE — Anesthesia Preprocedure Evaluation (Signed)
Anesthesia Evaluation  Patient identified by MRN, date of birth, ID band Patient awake    Reviewed: Allergy & Precautions, NPO status , Patient's Chart, lab work & pertinent test results  Airway Mallampati: I  TM Distance: >3 FB Neck ROM: Full    Dental  (+) Teeth Intact, Dental Advisory Given   Pulmonary neg pulmonary ROS,    breath sounds clear to auscultation       Cardiovascular negative cardio ROS  + dysrhythmias Supra Ventricular Tachycardia  Rhythm:Regular Rate:Normal     Neuro/Psych PSYCHIATRIC DISORDERS negative neurological ROS     GI/Hepatic Neg liver ROS, GERD  ,  Endo/Other  negative endocrine ROS  Renal/GU negative Renal ROS  negative genitourinary   Musculoskeletal negative musculoskeletal ROS (+)   Abdominal   Peds negative pediatric ROS (+)  Hematology negative hematology ROS (+)   Anesthesia Other Findings   Reproductive/Obstetrics negative OB ROS                             Anesthesia Physical Anesthesia Plan  ASA: II  Anesthesia Plan: General   Post-op Pain Management:    Induction: Intravenous  Airway Management Planned: LMA  Additional Equipment:   Intra-op Plan:   Post-operative Plan: Extubation in OR  Informed Consent: I have reviewed the patients History and Physical, chart, labs and discussed the procedure including the risks, benefits and alternatives for the proposed anesthesia with the patient or authorized representative who has indicated his/her understanding and acceptance.   Dental advisory given  Plan Discussed with: CRNA  Anesthesia Plan Comments:         Anesthesia Quick Evaluation

## 2016-11-09 ENCOUNTER — Encounter (HOSPITAL_BASED_OUTPATIENT_CLINIC_OR_DEPARTMENT_OTHER): Payer: Self-pay | Admitting: General Surgery

## 2016-11-09 ENCOUNTER — Other Ambulatory Visit: Payer: Self-pay | Admitting: General Surgery

## 2016-12-15 NOTE — Addendum Note (Signed)
Addendum  created 12/15/16 1305 by Effie Berkshire, MD   Sign clinical note

## 2017-01-09 ENCOUNTER — Encounter: Payer: Self-pay | Admitting: Cardiology

## 2017-01-15 ENCOUNTER — Encounter: Payer: Self-pay | Admitting: Cardiology

## 2017-01-16 ENCOUNTER — Encounter: Payer: Self-pay | Admitting: Cardiology

## 2017-01-16 ENCOUNTER — Ambulatory Visit (INDEPENDENT_AMBULATORY_CARE_PROVIDER_SITE_OTHER): Payer: Medicare Other | Admitting: Cardiology

## 2017-01-16 VITALS — BP 122/84 | HR 80 | Ht 65.0 in | Wt 140.8 lb

## 2017-01-16 DIAGNOSIS — I471 Supraventricular tachycardia: Secondary | ICD-10-CM

## 2017-01-16 DIAGNOSIS — R002 Palpitations: Secondary | ICD-10-CM | POA: Diagnosis not present

## 2017-01-16 DIAGNOSIS — E7800 Pure hypercholesterolemia, unspecified: Secondary | ICD-10-CM | POA: Diagnosis not present

## 2017-01-16 NOTE — Patient Instructions (Addendum)
Consider purchasing a [place] phone on [address] to record your EKG  Medication Instructions:  Your physician recommends that you continue on your current medications as directed. Please refer to the Current Medication list given to you today.   Labwork: Your physician recommends that you return for lab work in: today. Magnesium, TSH, CMP, lipid.   Testing/Procedures: Your physician has recommended that you wear a holter monitor. Holter monitors are medical devices that record the heart's electrical activity. Doctors most often use these monitors to diagnose arrhythmias. Arrhythmias are problems with the speed or rhythm of the heartbeat. The monitor is a small, portable device. You can wear one while you do your normal daily activities. This is usually used to diagnose what is causing palpitations/syncope (passing out). You will have this put on at [address] across from [place] had an EKG done in the office today.  Follow-Up: Your physician recommends that you schedule a follow-up appointment in: 1 month.   Any Other Special Instructions Will Be Listed Below (If Applicable).     If you need a refill on your cardiac medications before your next appointment, please call your pharmacy.

## 2017-01-16 NOTE — Progress Notes (Signed)
Cardiology Office Note:    Date:  01/16/2017   ID:  [name], DOB Jul 27, 1950, MRN [id number]  PCP:  Ronita Hipps, MD  Cardiologist:  Shirlee More, MD    Referring MD: Ronita Hipps, MD    ASSESSMENT:    1. SVT (supraventricular tachycardia) ([facility])   2. Hypercholesterolemia    PLAN:    In order of problems listed above:  1. She has recurrent clinical episodes of rapid heart rhythm at risk for both SVT and atrial fibrillation who undergo further evaluation including a Holter monitor and assessment of electrolytes and magnesium. I'll plan to see back in the office in one month or sooner at the episodes are persistent. 2. Stable continue her statin check liver function and lipid profile.  Next appointment: One month   Medication Adjustments/Labs and Tests Ordered: Current medicines are reviewed at length with the patient today.  Concerns regarding medicines are outlined above.  No orders of the defined types were placed in this encounter.  No orders of the defined types were placed in this encounter.   Chief Complaint  Patient presents with  . Follow-up    Annual follow up   . Tachycardia    Pt has c/o rapid heart rate x 3 weeks  . Spasms    Pt has c/o spasms both legs    History of Present Illness:    Tina [name] is a 66 y.o. female with a hx of Dyslipidemia, Palpitations and SVT. Recently she has had 2 episodes of rapid heart rhythm lasting for hours. Unfortunately she did not document her heart rate with a blood pressure cuff and she had associated weakness and shortness of breath with a rapid heartbeat. She's had no chest pain syncope or TIA and takes no over-the-counter proarrhythmic medications. She is on a beta blocker. She has a background history of remote SVT. Compliance with diet, lifestyle and medications: Yes Past Medical History:  Diagnosis Date  . Abnormal mammogram of right breast 10/2016  . Chest pain in adult    Normal MPS 2016  . Dental  crowns present   . GERD (gastroesophageal reflux disease)   . History of hepatitis as a child    "infectious", per pt.  . Irregular heartbeat    since age 33, per pt.  . Limited joint range of motion    cervical spine - difficulty looking up for extended period  . Seasonal allergies   . SVT (supraventricular tachycardia) (HCC)     Past Surgical History:  Procedure Laterality Date  . ANTERIOR CERVICAL DECOMP/DISCECTOMY FUSION  02/03/2002   C5-6  . CESAREAN SECTION     x 3  . INGUINAL HERNIA REPAIR Right   . LAPAROSCOPIC NISSEN FUNDOPLICATION  35/36/1443  . NISSEN FUNDOPLICATION  15/40/0867   redo  . RADIOACTIVE SEED GUIDED EXCISIONAL BREAST BIOPSY Right 11/08/2016   Procedure: RADIOACTIVE SEED GUIDED EXCISIONAL RIGHT BREAST BIOPSY;  Surgeon: Rolm Bookbinder, MD;  Location: [facility];  Service: General;  Laterality: Right;  . TONSILLECTOMY  1977    Current Medications: Current Meds  Medication Sig  . aspirin 81 MG tablet Take 81 mg by mouth daily.    [name] atorvastatin (LIPITOR) 20 MG tablet TAKE 1 TABLET BY MOUTH EVERY DAY  . cetirizine (ZYRTEC) 10 MG tablet TAKE 1 TABLET (10 MG TOTAL) BY MOUTH DAILY.  [name] Coenzyme Q10 (COQ-10 PO) Take 1 capsule by mouth daily.   [name] dexlansoprazole (DEXILANT) 60 MG capsule Take 60  mg by mouth daily.  . metoprolol (LOPRESSOR) 50 MG tablet TAKE 1 TABLET BY MOUTH 2 TIMES DAILY.  . Multiple Vitamins-Minerals (CENTRUM SILVER PO) Take by mouth.  [name] omeprazole (PRILOSEC) 20 MG capsule Take 20 mg by mouth daily.  . risedronate (ACTONEL) 35 MG tablet TAKE 1 TABLET BY MOUTH EVERY 7 DAYS WITH WATER ON EMPTY STOMACH, THEN NO FOOD, STAY UPRIGHT 30MINUTE     Allergies:   Codeine; Ilosone [erythromycin]; and Simvastatin   Social History   Social History  . Marital status: Married    Spouse name: N/A  . Number of children: N/A  . Years of education: N/A   Social History Main Topics  . Smoking status: Never Smoker  . Smokeless tobacco:  Never Used  . Alcohol use No  . Drug use: No  . Sexual activity: Not Asked   Other Topics Concern  . None   Social History Narrative   Retired Engineer, structural degree educatation   Married, lives with spouse -     Family History: The patient's family history includes Alzheimer's disease in her father and mother; Congestive Heart Failure in her father; Heart attack in her father, maternal grandfather, maternal grandmother, paternal grandfather, and paternal grandmother; Heart disease in her father; Heart failure in her father; Hyperlipidemia in her father; Prostate cancer in her father; Stroke in her paternal grandmother. ROS:   Please see the history of present illness.    All other systems reviewed and are negative.  EKGs/Labs/Other Studies Reviewed:    The following studies were reviewed today:  EKG:  EKG is  ordered today.  The ekg ordered today demonstrates Sinus rhythm normal  Recent Labs: No results found for requested labs within last 8760 hours.  Recent Lipid Panel    Component Value Date/Time   CHOL 164 03/04/2015 0824   TRIG 104.0 03/04/2015 0824   HDL 33.80 (L) 03/04/2015 0824   CHOLHDL 5 03/04/2015 0824   VLDL 20.8 03/04/2015 0824   LDLCALC 109 (H) 03/04/2015 0824    Physical Exam:    VS:  BP 122/84   Pulse 80   Ht 5\' 5"  (1.651 m)   Wt 140 lb 12.8 oz (63.9 kg)   SpO2 96%   BMI 23.43 kg/m     Wt Readings from Last 3 Encounters:  01/16/17 140 lb 12.8 oz (63.9 kg)  11/08/16 143 lb (64.9 kg)  04/12/16 145 lb (65.8 kg)     GEN:  Well nourished, well developed in no acute distress HEENT: Normal NECK: No JVD; No carotid bruits LYMPHATICS: No lymphadenopathy CARDIAC: RRR, no murmurs, rubs, gallops RESPIRATORY:  Clear to auscultation without rales, wheezing or rhonchi  ABDOMEN: Soft, non-tender, non-distended MUSCULOSKELETAL:  No edema; No deformity  SKIN: Warm and dry NEUROLOGIC:  Alert and oriented x 3 PSYCHIATRIC:  Normal  affect    Signed, Shirlee More, MD  01/16/2017 3:55 PM    [facility]

## 2017-01-17 LAB — COMPREHENSIVE METABOLIC PANEL
A/G RATIO: 1.4 (ref 1.2–2.2)
ALK PHOS: 107 IU/L (ref 39–117)
ALT: 19 IU/L (ref 0–32)
AST: 22 IU/L (ref 0–40)
Albumin: 4.2 g/dL (ref 3.6–4.8)
BILIRUBIN TOTAL: 0.3 mg/dL (ref 0.0–1.2)
BUN/Creatinine Ratio: 27 (ref 12–28)
BUN: 17 mg/dL (ref 8–27)
CALCIUM: 9.2 mg/dL (ref 8.7–10.3)
CHLORIDE: 100 mmol/L (ref 96–106)
CO2: 24 mmol/L (ref 20–29)
Creatinine, Ser: 0.63 mg/dL (ref 0.57–1.00)
GFR calc Af Amer: 108 mL/min/{1.73_m2} (ref >59)
GFR, EST NON AFRICAN AMERICAN: 94 mL/min/{1.73_m2} (ref >59)
GLOBULIN, TOTAL: 3.1 g/dL (ref 1.5–4.5)
Glucose: 92 mg/dL (ref 65–99)
POTASSIUM: 3.8 mmol/L (ref 3.5–5.2)
SODIUM: 140 mmol/L (ref 134–144)
Total Protein: 7.3 g/dL (ref 6.0–8.5)

## 2017-01-17 LAB — LIPID PANEL W/O CHOL/HDL RATIO
CHOLESTEROL TOTAL: 193 mg/dL (ref 100–199)
HDL: 43 mg/dL (ref >39)
LDL Calculated: 79 mg/dL (ref 0–99)
Triglycerides: 355 mg/dL — ABNORMAL HIGH (ref 0–149)
VLDL CHOLESTEROL CAL: 71 mg/dL — AB (ref 5–40)

## 2017-01-17 LAB — TSH: TSH: 1.31 u[IU]/mL (ref 0.450–4.500)

## 2017-01-17 LAB — MAGNESIUM: Magnesium: 2 mg/dL (ref 1.6–2.3)

## 2017-01-30 ENCOUNTER — Ambulatory Visit (INDEPENDENT_AMBULATORY_CARE_PROVIDER_SITE_OTHER): Payer: Medicare Other

## 2017-01-30 DIAGNOSIS — I471 Supraventricular tachycardia: Secondary | ICD-10-CM | POA: Diagnosis not present

## 2017-02-08 ENCOUNTER — Encounter: Payer: Self-pay | Admitting: Cardiology

## 2017-02-21 ENCOUNTER — Ambulatory Visit (INDEPENDENT_AMBULATORY_CARE_PROVIDER_SITE_OTHER): Payer: Medicare Other | Admitting: Cardiology

## 2017-02-21 VITALS — BP 106/74 | HR 73 | Ht 65.0 in | Wt 144.0 lb

## 2017-02-21 DIAGNOSIS — I471 Supraventricular tachycardia: Secondary | ICD-10-CM | POA: Diagnosis not present

## 2017-02-21 NOTE — Progress Notes (Signed)
Cardiology Office Note:    Date:  02/22/2017   ID:  Tina [name], DOB 12/24/1950, MRN [id number]  PCP:  Ronita Hipps, MD  Cardiologist:  Shirlee More, MD    Referring MD: Ronita Hipps, MD    ASSESSMENT:    1. SVT (supraventricular tachycardia) (HCC)    PLAN:    In order of problems listed above:  1. Stable continue beta blocker avoid over-the-counter proarrhythmic's and she responded well to reassurance.   Next appointment: 6 months   Medication Adjustments/Labs and Tests Ordered: Current medicines are reviewed at length with the patient today.  Concerns regarding medicines are outlined above.  No orders of the defined types were placed in this encounter.  Meds ordered this encounter  Medications  . DISCONTD: pravastatin (PRAVACHOL) 20 MG tablet    Sig: Take 0.5 tablets (10 mg total) by mouth 2 (two) times a week.    Dispense:  30 tablet    Refill:  3    Chief Complaint  Patient presents with  . Follow-up    1 month flup after 48 HR HM    History of Present Illness:    Tina [name] is a 66 y.o. female with a hx of Dyslipidemia, Palpitations and SVT last seen one month ago. Compliance with diet, lifestyle and medications: Yes Past Medical History:  Diagnosis Date  . Abnormal mammogram of right breast 10/2016  . Chest pain in adult    Normal MPS 2016  . Dental crowns present   . GERD (gastroesophageal reflux disease)   . History of hepatitis as a child    "infectious", per pt.  . Irregular heartbeat    since age 62, per pt.  . Limited joint range of motion    cervical spine - difficulty looking up for extended period  . Seasonal allergies   . SVT (supraventricular tachycardia) (HCC)     Past Surgical History:  Procedure Laterality Date  . ANTERIOR CERVICAL DECOMP/DISCECTOMY FUSION  02/03/2002   C5-6  . CESAREAN SECTION     x 3  . INGUINAL HERNIA REPAIR Right   . LAPAROSCOPIC NISSEN FUNDOPLICATION  76/19/5093  . NISSEN FUNDOPLICATION   26/71/2458   redo  . RADIOACTIVE SEED GUIDED EXCISIONAL BREAST BIOPSY Right 11/08/2016   Procedure: RADIOACTIVE SEED GUIDED EXCISIONAL RIGHT BREAST BIOPSY;  Surgeon: Rolm Bookbinder, MD;  Location: [facility];  Service: General;  Laterality: Right;  . TONSILLECTOMY  1977    Current Medications: Current Meds  Medication Sig  . aspirin 81 MG tablet Take 81 mg by mouth daily.    [name] atorvastatin (LIPITOR) 20 MG tablet TAKE 1 TABLET BY MOUTH EVERY DAY  . cetirizine (ZYRTEC) 10 MG tablet TAKE 1 TABLET (10 MG TOTAL) BY MOUTH DAILY.  [name] Coenzyme Q10 (COQ-10 PO) Take 1 capsule by mouth daily.   [name] dexlansoprazole (DEXILANT) 60 MG capsule Take 60 mg by mouth daily.  . metoprolol (LOPRESSOR) 50 MG tablet TAKE 1 TABLET BY MOUTH 2 TIMES DAILY.  . Multiple Vitamins-Minerals (CENTRUM SILVER PO) Take by mouth.  [name] omeprazole (PRILOSEC) 20 MG capsule Take 20 mg by mouth daily.  . risedronate (ACTONEL) 35 MG tablet TAKE 1 TABLET BY MOUTH EVERY 7 DAYS WITH WATER ON EMPTY STOMACH, THEN NO FOOD, STAY UPRIGHT 30MINUTE     Allergies:   Codeine; Ilosone [erythromycin]; and Simvastatin   Social History   Social History  . Marital status: Married    Spouse name: N/A  . Number  of children: N/A  . Years of education: N/A   Social History Main Topics  . Smoking status: Never Smoker  . Smokeless tobacco: Never Used  . Alcohol use No  . Drug use: No  . Sexual activity: Not Asked   Other Topics Concern  . None   Social History Narrative   Retired Engineer, structural degree educatation   Married, lives with spouse -     Family History: The patient's family history includes Alzheimer's disease in her father and mother; Congestive Heart Failure in her father; Heart attack in her father, maternal grandfather, maternal grandmother, paternal grandfather, and paternal grandmother; Heart disease in her father; Heart failure in her father; Hyperlipidemia in her father; Prostate  cancer in her father; Stroke in her paternal grandmother. ROS:   Please see the history of present illness.    All other systems reviewed and are negative.  EKGs/Labs/Other Studies Reviewed:    The following studies were reviewed today:  Recent Holter monitor showed no recurrent SVT  Recent Labs: 01/16/2017: ALT 19; BUN 17; Creatinine, Ser 0.63; Magnesium 2.0; Potassium 3.8; Sodium 140; TSH 1.310  Recent Lipid Panel    Component Value Date/Time   CHOL 193 01/16/2017 1634   TRIG 355 (H) 01/16/2017 1634   HDL 43 01/16/2017 1634   CHOLHDL 5 03/04/2015 0824   VLDL 20.8 03/04/2015 0824   LDLCALC 79 01/16/2017 1634    Physical Exam:    VS:  BP 106/74 (BP Location: Right Arm, Patient Position: Sitting)   Pulse 73   Ht 5\' 5"  (1.651 m)   Wt 144 lb (65.3 kg)   SpO2 97%   BMI 23.96 kg/m     Wt Readings from Last 3 Encounters:  02/22/17 144 lb (65.3 kg)  01/16/17 140 lb 12.8 oz (63.9 kg)  11/08/16 143 lb (64.9 kg)     GEN:  Well nourished, well developed in no acute distress HEENT: Normal NECK: No JVD; No carotid bruits LYMPHATICS: No lymphadenopathy CARDIAC: RRR, no murmurs, rubs, gallops RESPIRATORY:  Clear to auscultation without rales, wheezing or rhonchi  ABDOMEN: Soft, non-tender, non-distended MUSCULOSKELETAL:  No edema; No deformity  SKIN: Warm and dry NEUROLOGIC:  Alert and oriented x 3 PSYCHIATRIC:  Normal affect    Signed, Shirlee More, MD  02/22/2017 1:11 PM    [facility]

## 2017-02-22 ENCOUNTER — Encounter: Payer: Self-pay | Admitting: Cardiology

## 2017-02-22 MED ORDER — PRAVASTATIN SODIUM 20 MG PO TABS: 10.0000 mg | ORAL_TABLET | ORAL | 3 refills | Status: DC

## 2017-02-22 NOTE — Patient Instructions (Signed)

## 2017-02-26 ENCOUNTER — Other Ambulatory Visit: Payer: Self-pay

## 2017-02-26 MED ORDER — METOPROLOL TARTRATE 50 MG PO TABS: 50.0000 mg | ORAL_TABLET | Freq: Two times a day (BID) | ORAL | 2 refills | Status: DC

## 2017-04-14 ENCOUNTER — Emergency Department (HOSPITAL_COMMUNITY)
Admission: EM | Admit: 2017-04-14 | Discharge: 2017-04-14 | Disposition: A | Discharge status: Home / Self Care | Payer: Medicare Other | Attending: Emergency Medicine | Admitting: Emergency Medicine

## 2017-04-14 ENCOUNTER — Emergency Department (HOSPITAL_COMMUNITY): Payer: Medicare Other

## 2017-04-14 ENCOUNTER — Encounter (HOSPITAL_COMMUNITY): Payer: Self-pay

## 2017-04-14 DIAGNOSIS — R0789 Other chest pain: Secondary | ICD-10-CM | POA: Diagnosis not present

## 2017-04-14 DIAGNOSIS — Z79899 Other long term (current) drug therapy: Secondary | ICD-10-CM | POA: Insufficient documentation

## 2017-04-14 DIAGNOSIS — Z8679 Personal history of other diseases of the circulatory system: Secondary | ICD-10-CM | POA: Insufficient documentation

## 2017-04-14 DIAGNOSIS — R079 Chest pain, unspecified: Secondary | ICD-10-CM | POA: Diagnosis present

## 2017-04-14 HISTORY — DX: Personal history of other diseases of the circulatory system: Z86.79

## 2017-04-14 LAB — BASIC METABOLIC PANEL
ANION GAP: 10 (ref 5–15)
BUN: 18 mg/dL (ref 6–20)
CALCIUM: 9.6 mg/dL (ref 8.9–10.3)
CO2: 26 mmol/L (ref 22–32)
Chloride: 102 mmol/L (ref 101–111)
Creatinine, Ser: 0.77 mg/dL (ref 0.44–1.00)
GFR calc Af Amer: >60 mL/min (ref >60)
GFR calc non Af Amer: >60 mL/min (ref >60)
GLUCOSE: 106 mg/dL — AB (ref 65–99)
POTASSIUM: 3.9 mmol/L (ref 3.5–5.1)
Sodium: 138 mmol/L (ref 135–145)

## 2017-04-14 LAB — CBC
HEMATOCRIT: 39.5 % (ref 36.0–46.0)
HEMOGLOBIN: 13.7 g/dL (ref 12.0–15.0)
MCH: 30.6 pg (ref 26.0–34.0)
MCHC: 34.7 g/dL (ref 30.0–36.0)
MCV: 88.4 fL (ref 78.0–100.0)
Platelets: 326 10*3/uL (ref 150–400)
RBC: 4.47 MIL/uL (ref 3.87–5.11)
RDW: 12.6 % (ref 11.5–15.5)
WBC: 10.8 10*3/uL — ABNORMAL HIGH (ref 4.0–10.5)

## 2017-04-14 LAB — POCT I-STAT TROPONIN I
Troponin i, poc: 0 ng/mL (ref 0.00–0.08)
Troponin i, poc: 0 ng/mL (ref 0.00–0.08)

## 2017-04-14 NOTE — ED Provider Notes (Signed)
[place] DEPT Provider Note   CSN: [id number] Arrival date & time: 04/14/17  1339     History   Chief Complaint Chief Complaint  Patient presents with  . Chest Pain    HPI Tina [name] is a 66 y.o. female.  66 yo F with a chief complaints of left-sided chest pain. This been going on for the past day. Started suddenly. Worse with twisting movement palpation. Denies cough congestion fever. Denies trauma. Denies lower extremity edema denies hemoptysis denies prior history of PE or DVT. Denies prior history of MI. Symptoms have come and gone since this started. Some mild shortness of breath with it. Denies radiation. Pain is along the sternum described as sharp and shooting. Also has similar pinpoint pain to the lateral aspect of the left breast.   The history is provided by the patient.  Chest Pain   This is a new problem. The current episode started 3 to 5 hours ago. The problem occurs constantly. The problem has not changed since onset.The pain is associated with movement. The pain is present in the lateral region. The pain is at a severity of 8/10. The pain is mild. The quality of the pain is described as pressure-like and sharp. The pain does not radiate. Associated symptoms include shortness of breath. Pertinent negatives include no dizziness, no fever, no headaches, no nausea, no palpitations and no vomiting. She has tried nothing for the symptoms. The treatment provided no relief. There are no known risk factors.  Her past medical history is significant for hyperlipidemia and hypertension.  Pertinent negatives for past medical history include no DVT, no MI and no PE.  Pertinent negatives for family medical history include: no early MI.    Past Medical History:  Diagnosis Date  . Abnormal mammogram of right breast 10/2016  . Chest pain in adult    Normal MPS 2016  . Dental crowns present   . GERD (gastroesophageal reflux disease)   . History of hepatitis as a child    "infectious", per pt.  . Irregular heartbeat    since age 52, per pt.  . Limited joint range of motion    cervical spine - difficulty looking up for extended period  . Seasonal allergies   . SVT (supraventricular tachycardia) [facility])     Patient Active Problem List   Diagnosis Date Noted  . SVT (supraventricular tachycardia) ([place]) 02/26/2016  . Chest pain of unknown etiology 08/24/2014  . Osteoporosis, post-menopausal   . Leg pain, right 03/20/2012  . Postural dizziness 02/01/2011  . Anxiety and depression 02/01/2011  . Heart palpitations 10/13/2010  . GERD (gastroesophageal reflux disease)   . Hypercholesterolemia     Past Surgical History:  Procedure Laterality Date  . ANTERIOR CERVICAL DECOMP/DISCECTOMY FUSION  02/03/2002   C5-6  . CESAREAN SECTION     x 3  . INGUINAL HERNIA REPAIR Right   . LAPAROSCOPIC NISSEN FUNDOPLICATION  12/45/8099  . NISSEN FUNDOPLICATION  83/38/2505   redo  . RADIOACTIVE SEED GUIDED EXCISIONAL BREAST BIOPSY Right 11/08/2016   Procedure: RADIOACTIVE SEED GUIDED EXCISIONAL RIGHT BREAST BIOPSY;  Surgeon: Rolm Bookbinder, MD;  Location: [facility];  Service: General;  Laterality: Right;  . TONSILLECTOMY  1977    OB History    No data available       Home Medications    Prior to Admission medications   Medication Sig Start Date End Date Taking? Authorizing Provider  aspirin 81 MG tablet Take 81 mg by  mouth daily.      [provider]  atorvastatin (LIPITOR) 20 MG tablet TAKE 1 TABLET BY MOUTH EVERY DAY 09/17/14   Darlin Coco, MD  cetirizine (ZYRTEC) 10 MG tablet TAKE 1 TABLET (10 MG TOTAL) BY MOUTH DAILY. 10/08/14   Rowe Clack, MD  Coenzyme Q10 (COQ-10 PO) Take 1 capsule by mouth daily.     [provider]  dexlansoprazole (DEXILANT) 60 MG capsule Take 60 mg by mouth daily.    [provider]  metoprolol tartrate (LOPRESSOR) 50 MG tablet Take 1 tablet (50 mg total) by mouth 2 (two) times  daily. 02/26/17   Richardo Priest, MD  Multiple Vitamins-Minerals (CENTRUM SILVER PO) Take by mouth.    [provider]  omeprazole (PRILOSEC) 20 MG capsule Take 20 mg by mouth daily.    [provider]  risedronate (ACTONEL) 35 MG tablet TAKE 1 TABLET BY MOUTH EVERY 7 DAYS WITH WATER ON EMPTY STOMACH, THEN NO FOOD, STAY UPRIGHT 30MINUTE 06/19/14   Rowe Clack, MD    Family History Family History  Problem Relation Age of Onset  . Alzheimer's disease Mother   . Alzheimer's disease Father   . Prostate cancer Father   . Hyperlipidemia Father   . Heart disease Father   . Congestive Heart Failure Father   . Heart attack Father   . Heart failure Father   . Stroke Paternal Grandmother   . Heart attack Paternal Grandmother   . Heart attack Maternal Grandmother   . Heart attack Maternal Grandfather   . Heart attack Paternal Grandfather     Social History Social History  Substance Use Topics  . Smoking status: Never Smoker  . Smokeless tobacco: Never Used  . Alcohol use No     Allergies   Codeine; Ilosone [erythromycin]; and Simvastatin   Review of Systems Review of Systems  Constitutional: Negative for chills and fever.  HENT: Negative for congestion and rhinorrhea.   Eyes: Negative for redness and visual disturbance.  Respiratory: Positive for shortness of breath. Negative for wheezing.   Cardiovascular: Positive for chest pain. Negative for palpitations.  Gastrointestinal: Negative for nausea and vomiting.  Genitourinary: Negative for dysuria and urgency.  Musculoskeletal: Negative for arthralgias and myalgias.  Skin: Negative for pallor and wound.  Neurological: Negative for dizziness and headaches.     Physical Exam Updated Vital Signs BP 119/69 (BP Location: Right Arm)   Pulse 78   Temp 98 F (36.7 C)   Resp 20   SpO2 98%   Physical Exam  Constitutional: She is oriented to person, place, and time. She appears well-developed and  well-nourished. No distress.  HENT:  Head: Normocephalic and atraumatic.  Eyes: Pupils are equal, round, and reactive to light. EOM are normal.  Neck: Normal range of motion. Neck supple.  Cardiovascular: Normal rate and regular rhythm.  Exam reveals no gallop and no friction rub.   No murmur heard. Pulmonary/Chest: Effort normal. She has no wheezes. She has no rales. She exhibits tenderness (ttp about the right chest wall, pinpoint to the right breast).  Abdominal: Soft. She exhibits no distension and no mass. There is no tenderness. There is no guarding.  Musculoskeletal: She exhibits no edema or tenderness.  Neurological: She is alert and oriented to person, place, and time.  Skin: Skin is warm and dry. She is not diaphoretic.  Psychiatric: She has a normal mood and affect. Her behavior is normal.  Nursing note and vitals reviewed.  ED Treatments / Results  Labs (all labs ordered are listed, but only abnormal results are displayed) Labs Reviewed  BASIC METABOLIC PANEL - Abnormal; Notable for the following:       Result Value   Glucose, Bld 106 (*)    All other components within normal limits  CBC - Abnormal; Notable for the following:    WBC 10.8 (*)    All other components within normal limits  I-STAT TROPONIN, ED  POCT I-STAT TROPONIN I  I-STAT TROPONIN, ED  POCT I-STAT TROPONIN I    EKG  EKG Interpretation  Date/Time:  Saturday April 14 2017 13:47:34 EDT Ventricular Rate:  90 PR Interval:    QRS Duration: 92 QT Interval:  349 QTC Calculation: 427 R Axis:   56 Text Interpretation:  Sinus rhythm mormal Confirmed by Charlesetta Shanks [id number]) on 04/14/2017 1:57:12 PM       Radiology Dg Chest 2 View  Result Date: 04/14/2017 CLINICAL DATA:  Pt c/o acute left side chest pain onset approx 30 min. ago. Pain radiating into left shoulder and left axillary region. Describes pain as sharp. H/o irregular heartbeat and supraventricular tachycardia. Nonsmoker. EXAM: CHEST   2 VIEW COMPARISON:  06/03/2014 FINDINGS: Heart size is mildly enlarged . There are no focal consolidations or pleural effusions. No pulmonary edema. Surgical clips are noted in the left upper quadrant. Status post cervical fusion. IMPRESSION: Mild cardiomegaly. Electronically Signed   By: Nolon Nations M.D.   On: 04/14/2017 14:25    Procedures Procedures (including critical care time)  Medications Ordered in ED Medications - No data to display   Initial Impression / Assessment and Plan / ED Course  I have reviewed the triage vital signs and the nursing notes.  Pertinent labs & imaging results that were available during my care of the patient were reviewed by me and considered in my medical decision making (see chart for details).     66 yo F With a chief complaint of chest pain. This reproduced on exam. She is a delta troponin negative. I doubt PE. Discharge home.  10:56 PM:  I have discussed the diagnosis/risks/treatment options with the patient and family and believe the pt to be eligible for discharge home to follow-up with PCP. We also discussed returning to the ED immediately if new or worsening sx occur. We discussed the sx which are most concerning (e.g., sudden worsening pain, fever, inability to tolerate by mouth) that necessitate immediate return. Medications administered to the patient during their visit and any new prescriptions provided to the patient are listed below.  Medications given during this visit Medications - No data to display   The patient appears reasonably screen and/or stabilized for discharge and I doubt any other medical condition or other [facility] requiring further screening, evaluation, or treatment in the ED at this time prior to discharge.    Final Clinical Impressions(s) / ED Diagnoses   Final diagnoses:  Atypical chest pain    New Prescriptions New Prescriptions   No medications on file     Deno Etienne, DO 04/14/17 2256

## 2017-04-14 NOTE — ED Triage Notes (Signed)
Pt complaining of sudden chest pain that started at about 1330. She describes it as sharp and states that it runs from the center of her chest to under her L breast and her L shoulder and neck. She reports that it is causing  SOB; she denies N/V or diaphoresis. She sees Dr. Bettina Gavia as her cardiologist for irregular heartbeat. She has taken 2 aspirin. She denies strenuous activity or any precipitating factors to the chest pain. A&Ox4.

## 2017-04-14 NOTE — Discharge Instructions (Signed)
Take 4 over the counter ibuprofen tablets 3 times a day or 2 over-the-counter naproxen tablets twice a day for pain. Also take tylenol 1000mg(2 extra strength) four times a day.    

## 2017-04-16 NOTE — Progress Notes (Signed)
Cardiology Office Note:    Date:  04/17/2017   ID:  Tina [name], DOB 08-Nov-1950, MRN [id number]  PCP:  Ronita Hipps, MD  Cardiologist:  Shirlee More, MD    Referring MD: Ronita Hipps, MD    ASSESSMENT:    1. Chest pain of unknown etiology   2. SVT (supraventricular tachycardia) ([place])   3. Pleuritis    PLAN:    In order of problems listed above:  1.  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 She has no risk factorsfor venous thromboembolism and if her d-dimer is not elevated I would not pursue the diagnosis. Unfortunately she cannot take nonsteroidal anti-inflammatory drugs. We will check a sedimentation rate recheck troponin echocardiogram CBC and unless these are abnormal I would not start steroids.I reassured her that this is generally a self-limited benign disorder she can take Tylenol over-the-counter 10 gr every 8-12 hours.she'll return to the office in one week to assess her response. 2. Chest x-ray was normal and appears to be viral idiopathic 3. Stable no recurrent SVT continue her beta blocker   Next appointment: one week   Medication Adjustments/Labs and Tests Ordered: Current medicines are reviewed at length with the patient today.  Concerns regarding medicines are outlined above.  Orders Placed This Encounter  Procedures  . Sed Rate (ESR)  . CBC  . D-Dimer, Quantitative  . EKG 12-Lead  . ECHOCARDIOGRAM COMPLETE   No orders of the defined types were placed in this encounter.   Chief Complaint  Patient presents with  . Follow-up    per [facility] visit to evalute CP  . Chest Pain    History of Present Illness:    Tina [name] is a 66 y.o. female with a hx of Svt and hyperlipidemia last seen over a year ago. Seen at [facility] ED 04/14/17 with non anginal chest pain and normal EKG and Troponin. The onset was while doing usual activity shopping. She describes as abrupt left precordial discomfort  and shortness of breath with a pleuritic component. She has no actors for  PULMONARY THROMBOEMBOLISM> The chest pain has never resolved and is now more intermittent positional and pleuritic in nature and radiating towards the left shoulder.she is no longer short of breath. She's had a cough which is chronic due to her esophageal problems but no fever or chills. Prior to onset she just basically did not feel well. Compliance with diet, lifestyle and medications: yes Past Medical History:  Diagnosis Date  . Abnormal mammogram of right breast 10/2016  . Chest pain in adult    Normal MPS 2016  . Dental crowns present   . GERD (gastroesophageal reflux disease)   . History of hepatitis as a child    "infectious", per pt.  . Irregular heartbeat    since age 42, per pt.  . Limited joint range of motion    cervical spine - difficulty looking up for extended period  . Seasonal allergies   . SVT (supraventricular tachycardia) (HCC)     Past Surgical History:  Procedure Laterality Date  . ANTERIOR CERVICAL DECOMP/DISCECTOMY FUSION  02/03/2002   C5-6  . CESAREAN SECTION     x 3  . INGUINAL HERNIA REPAIR Right   . LAPAROSCOPIC NISSEN FUNDOPLICATION  36/14/4315  . NISSEN FUNDOPLICATION  40/02/6760   redo  . RADIOACTIVE SEED GUIDED EXCISIONAL BREAST BIOPSY Right 11/08/2016   Procedure: RADIOACTIVE SEED GUIDED EXCISIONAL RIGHT BREAST  BIOPSY;  Surgeon: Rolm Bookbinder, MD;  Location: [place];  Service: General;  Laterality: Right;  . TONSILLECTOMY  1977    Current Medications: Current Meds  Medication Sig  . aspirin 81 MG tablet Take 81 mg by mouth daily.    [name] atorvastatin (LIPITOR) 20 MG tablet TAKE 1 TABLET BY MOUTH EVERY DAY  . cetirizine (ZYRTEC) 10 MG tablet TAKE 1 TABLET (10 MG TOTAL) BY MOUTH DAILY.  [name] Coenzyme Q10 (COQ-10 PO) Take 1 capsule by mouth daily.   [name] dexlansoprazole (DEXILANT) 60 MG capsule Take 60 mg by mouth daily.  . metoprolol tartrate (LOPRESSOR) 50  MG tablet Take 1 tablet (50 mg total) by mouth 2 (two) times daily.  . Multiple Vitamins-Minerals (CENTRUM SILVER PO) Take 1 tablet by mouth daily.   . risedronate (ACTONEL) 35 MG tablet TAKE 1 TABLET BY MOUTH EVERY 7 DAYS WITH WATER ON EMPTY STOMACH, THEN NO FOOD, STAY UPRIGHT 30MINUTE     Allergies:   Codeine; Ilosone [erythromycin]; and Simvastatin   Social History   Social History  . Marital status: Married    Spouse name: N/A  . Number of children: N/A  . Years of education: N/A   Social History Main Topics  . Smoking status: Never Smoker  . Smokeless tobacco: Never Used  . Alcohol use No  . Drug use: No  . Sexual activity: Not Asked   Other Topics Concern  . None   Social History Narrative   Retired Engineer, structural degree educatation   Married, lives with spouse -     Family History: The patient's family history includes Alzheimer's disease in her father and mother; Congestive Heart Failure in her father; Heart attack in her father, maternal grandfather, maternal grandmother, paternal grandfather, and paternal grandmother; Heart disease in her father; Heart failure in her father; Hyperlipidemia in her father; Prostate cancer in her father; Stroke in her paternal grandmother. ROS:   Please see the history of present illness.    All other systems reviewed and are negative.  EKGs/Labs/Other Studies Reviewed:    The following studies were reviewed today including the [facility] ED records. MPI 08/28/14: The stress test is low risk. There was no ischemia. The ejection fraction was normal at 55%.  EKG:  EKG ordered today.  The ekg ordered today demonstrates sinus rhythm normal no findings of pericarditis CXR: FINDINGS: Heart size is mildly enlarged . There are no focal consolidations or pleural effusions. No pulmonary edema. Surgical clips are noted in the left upper quadrant. Status post cervical fusion. IMPRESSION: Mild cardiomegaly. Recent  Labs: 01/16/2017: ALT 19; Magnesium 2.0; TSH 1.310 04/14/2017: BUN 18; Creatinine, Ser 0.77; Hemoglobin 13.7; Platelets 326; Potassium 3.9; Sodium 138  Recent Lipid Panel    Component Value Date/Time   CHOL 193 01/16/2017 1634   TRIG 355 (H) 01/16/2017 1634   HDL 43 01/16/2017 1634   CHOLHDL 5 03/04/2015 0824   VLDL 20.8 03/04/2015 0824   LDLCALC 79 01/16/2017 1634    Physical Exam:    VS:  BP (!) 142/80 (BP Location: Right Arm, Patient Position: Sitting)   Pulse 78   Ht 5' 5"  (1.651 m)   Wt 142 lb (64.4 kg)   SpO2 95%   BMI 23.63 kg/m     Wt Readings from Last 3 Encounters:  04/17/17 142 lb (64.4 kg)  02/22/17 144 lb (65.3 kg)  01/16/17 140 lb 12.8 oz (63.9 kg)     GEN:  Well nourished, well developed in no acute distress HEENT: Normal NECK: No JVD; No carotid bruits LYMPHATICS: No lymphadenopathy CARDIAC: RRR, no murmurs, rubs, gallops RESPIRATORY:  Clear to auscultation without rales, wheezing or rhonchi , tender left CCJ and faint pleuritic rub ABDOMEN: Soft, non-tender, non-distended MUSCULOSKELETAL:  No edema; No deformity  SKIN: Warm and dry NEUROLOGIC:  Alert and oriented x 3 PSYCHIATRIC:  Normal affect    Signed, Shirlee More, MD  04/17/2017 4:08 PM    [facility]

## 2017-04-17 ENCOUNTER — Ambulatory Visit (INDEPENDENT_AMBULATORY_CARE_PROVIDER_SITE_OTHER): Payer: Medicare Other | Admitting: Cardiology

## 2017-04-17 ENCOUNTER — Encounter: Payer: Self-pay | Admitting: Cardiology

## 2017-04-17 VITALS — BP 142/80 | HR 78 | Ht 65.0 in | Wt 142.0 lb

## 2017-04-17 DIAGNOSIS — R7989 Other specified abnormal findings of blood chemistry: Secondary | ICD-10-CM

## 2017-04-17 DIAGNOSIS — R0789 Other chest pain: Secondary | ICD-10-CM

## 2017-04-17 DIAGNOSIS — I471 Supraventricular tachycardia: Secondary | ICD-10-CM | POA: Diagnosis not present

## 2017-04-17 DIAGNOSIS — R091 Pleurisy: Secondary | ICD-10-CM | POA: Insufficient documentation

## 2017-04-17 DIAGNOSIS — R079 Chest pain, unspecified: Secondary | ICD-10-CM

## 2017-04-17 HISTORY — DX: Pleurisy: R09.1

## 2017-04-17 NOTE — Patient Instructions (Addendum)
Medication Instructions:  See bottom of instructions  Labwork: Your physician recommends that you have Sed rate, troponin, Ddimer, and CBC   Testing/Procedures: You had an EKG today  Follow-Up: 1 week  Any Other Special Instructions Will Be Listed Below (If Applicable).     If you need a refill on your cardiac medications before your next appointment, please call your pharmacy.   Pleurisy Pleurisy, also called pleuritis, is irritation and swelling (inflammation) of the linings of the lungs. The linings of the lungs are called pleura. They cover the outside of the lungs and the inside of the chest wall. There is a small amount of fluid (pleural fluid) between the pleura that allows the lungs to move in and out smoothly when you breathe. Pleurisy causes the pleura to be rough and dry and to rub together when you breathe, which is painful. In some cases, pleurisy can cause pleural fluid to build up between the pleura (pleural effusion). What are the causes? Common causes of this condition include:  A lung infection caused by bacteria or a virus.  A blood clot that travels to the lung (pulmonary embolism).  Air leaking into the pleural space (pneumothorax).  Lung cancer or a lung tumor.  A chest injury.  Diseases that can cause lung inflammation. These include rheumatoid arthritis, lupus, sickle cell disease, inflammatory bowel disease, and pancreatitis.  Heart or chest surgery.  Lung damage from inhaling asbestos.  A lung reaction to certain medicines.  Sometimes the cause is unknown. What are the signs or symptoms? Chest pain is the main symptom of this condition. The pain is usually on one side. Chest pain may start suddenly and be sharp or stabbing. It may become a constant dull ache. You may also feel pain in your back or shoulder. The pain may get worse when you cough, take deep breaths, or make sudden movements. Other symptoms may include:  Shortness of  breath.  Noisy breathing (wheezing).  Cough.  Chills.  Fever.  How is this diagnosed? This condition may be diagnosed based on:  Your medical history.  Your symptoms.  A physical exam. Your health care provider will listen to your breathing with a stethoscope to check for a rough, rubbing sound (friction rub). If you have pleural effusion, your breathing sounds may be muffled.  Tests, such as: ? Blood tests to check for infections or diseases and to measure the oxygen in your blood. ? Imaging studies of your lungs. These may include a chest X-ray, ultrasound, MRI, or CT scan. ? A procedure to remove pleural fluid with a needle for testing (thoracentesis).  How is this treated? Treatment for this condition depends on the cause. Pleurisy that was caused by a virus usually clears up within 2 weeks. Treatment for pleurisy may include:  NSAIDs to help relieve pain and swelling.  Antibiotic medicines, if your condition was caused by a bacterial infection.  Prescription pain or cough medicine.  Medicines to dissolve a blood clot, if your condition was caused by pulmonary embolism.  Removal of pleural fluid or air.  Follow these instructions at home: Medicines  Take over-the-counter and prescription medicines only as told by your health care provider.  If you were prescribed an antibiotic, take it as told by your health care provider. Do not stop taking the antibiotic even if you start to feel better. Activity  Rest and return to your normal activities as told by your health care provider. Ask your health care provider what activities  are safe for you.  Do not drive or use heavy machinery while taking prescription pain medicine. General instructions  Monitor your pleurisy for any changes.  Take deep breaths often, even if it is painful. This can help prevent lung infection (pneumonia) and collapse of lung tissue (atelectasis).  When lying down, lie on your painful side.  This may reduce pain.  Do not smoke. If you need help quitting, ask your health care provider.  Keep all follow-up visits as told by your health care provider. This is important. Contact a health care provider if:  You have pain that: ? Gets worse. ? Does not get better with medicine. ? Lasts for more than 1 week.  You have a fever or chills.  Your cough or shortness of breath is not improving at home.  You cough up pus-like (purulent) secretions. Get help right away if:  Your lips, fingernails, or toenails darken or turn blue.  You cough up blood.  You have any of the following symptoms that get worse: ? Difficulty breathing. ? Shortness of breath. ? Wheezing.  You have pain that spreads into your neck, arms, or jaw.  You develop a rash.  You vomit.  You faint. Summary  Pleurisy is inflammation of the linings of the lungs (pleura).  Pleurisy causes pain that makes it difficult for you to breathe or cough.  Pleurisy is often caused by an underlying infection or disease.  Treatment of pleurisy depends on the cause, and it often includes medicines. This information is not intended to replace advice given to you by your health care provider. Make sure you discuss any questions you have with your health care provider. Document Released: 07/03/2005 Document Revised: 03/27/2016 Document Reviewed: 03/27/2016 Elsevier Interactive Patient Education  2017 [place] 2 tylenol two to three times a day  Take your temperature twice a day

## 2017-04-18 ENCOUNTER — Ambulatory Visit (HOSPITAL_BASED_OUTPATIENT_CLINIC_OR_DEPARTMENT_OTHER)
Admission: RE | Admit: 2017-04-18 | Discharge: 2017-04-18 | Disposition: A | Discharge status: Home / Self Care | Payer: Medicare Other | Source: Ambulatory Visit | Attending: Cardiology | Admitting: Cardiology

## 2017-04-18 ENCOUNTER — Other Ambulatory Visit: Payer: Self-pay | Admitting: Cardiology

## 2017-04-18 DIAGNOSIS — R911 Solitary pulmonary nodule: Secondary | ICD-10-CM | POA: Diagnosis not present

## 2017-04-18 DIAGNOSIS — R079 Chest pain, unspecified: Secondary | ICD-10-CM

## 2017-04-18 DIAGNOSIS — I700 Atherosclerosis of aorta: Secondary | ICD-10-CM | POA: Diagnosis not present

## 2017-04-18 DIAGNOSIS — R0609 Other forms of dyspnea: Secondary | ICD-10-CM | POA: Diagnosis present

## 2017-04-18 DIAGNOSIS — R0602 Shortness of breath: Secondary | ICD-10-CM

## 2017-04-18 HISTORY — DX: Solitary pulmonary nodule: R91.1

## 2017-04-18 LAB — TROPONIN I

## 2017-04-18 LAB — CBC
HEMATOCRIT: 39.4 % (ref 34.0–46.6)
HEMOGLOBIN: 13.4 g/dL (ref 11.1–15.9)
MCH: 30 pg (ref 26.6–33.0)
MCHC: 34 g/dL (ref 31.5–35.7)
MCV: 88 fL (ref 79–97)
Platelets: 320 10*3/uL (ref 150–379)
RBC: 4.46 x10E6/uL (ref 3.77–5.28)
RDW: 13.1 % (ref 12.3–15.4)
WBC: 7.7 10*3/uL (ref 3.4–10.8)

## 2017-04-18 LAB — D-DIMER, QUANTITATIVE: D-DIMER: 1.67 mg/L FEU — ABNORMAL HIGH (ref 0.00–0.49)

## 2017-04-18 LAB — SEDIMENTATION RATE: SED RATE: 12 mm/h (ref 0–40)

## 2017-04-18 MED ORDER — IOPAMIDOL (ISOVUE-370) INJECTION 76%
100.0000 mL | Freq: Once | INTRAVENOUS | Status: AC | PRN
  Administered 2017-04-18: 100 mL via INTRAVENOUS

## 2017-04-19 ENCOUNTER — Other Ambulatory Visit (INDEPENDENT_AMBULATORY_CARE_PROVIDER_SITE_OTHER): Payer: Medicare Other

## 2017-04-19 DIAGNOSIS — I471 Supraventricular tachycardia: Secondary | ICD-10-CM

## 2017-04-25 NOTE — Progress Notes (Signed)
Cardiology Office Note:    Date:  04/26/2017   ID:  Tina [name], DOB 03/18/1951, MRN [id number]  PCP:  Tina Hipps, MD  Cardiologist:  Tina More, MD    Referring MD: Tina Hipps, MD    ASSESSMENT:    1. Pleuritic chest pain    PLAN:    In order of problems listed above:  1. resolved, her evaluation including sedimentation rate CT of the chest is unremarkable all consistent with idiopathic pleuritis.echocardiogram is pending for next week.   Next appointment: 6 months   Medication Adjustments/Labs and Tests Ordered: Current medicines are reviewed at length with the patient today.  Concerns regarding medicines are outlined above.  No orders of the defined types were placed in this encounter.  No orders of the defined types were placed in this encounter.   Chief Complaint  Patient presents with  . Follow-up    1 week flup appt   . Chest Pain    History of Present Illness:    Tina [name] is a 66 y.o. female with a hx of Svt and hyperlipidemia  last seen one week ago with pleuritic chest pain. Compliance with diet, lifestyle and medications: Yes Past Medical History:  Diagnosis Date  . Abnormal mammogram of right breast 10/2016  . Chest pain in adult    Normal MPS 2016  . Dental crowns present   . GERD (gastroesophageal reflux disease)   . History of hepatitis as a child    "infectious", per pt.  . Irregular heartbeat    since age 33, per pt.  . Limited joint range of motion    cervical spine - difficulty looking up for extended period  . Seasonal allergies   . SVT (supraventricular tachycardia) ([facility])     Past Surgical History:  Procedure Laterality Date  . ANTERIOR CERVICAL DECOMP/DISCECTOMY FUSION  02/03/2002   C5-6  . CESAREAN SECTION     x 3  . INGUINAL HERNIA REPAIR Right   . LAPAROSCOPIC NISSEN FUNDOPLICATION  01/14/1600  . NISSEN FUNDOPLICATION  09/32/3557   redo  . RADIOACTIVE SEED GUIDED EXCISIONAL BREAST BIOPSY Right 11/08/2016    Procedure: RADIOACTIVE SEED GUIDED EXCISIONAL RIGHT BREAST BIOPSY;  Surgeon: Rolm Bookbinder, MD;  Location: [facility];  Service: General;  Laterality: Right;  . TONSILLECTOMY  1977    Current Medications: Current Meds  Medication Sig  . aspirin 81 MG tablet Take 81 mg by mouth daily.    [name] atorvastatin (LIPITOR) 20 MG tablet TAKE 1 TABLET BY MOUTH EVERY DAY  . cetirizine (ZYRTEC) 10 MG tablet TAKE 1 TABLET (10 MG TOTAL) BY MOUTH DAILY.  [name] Coenzyme Q10 (COQ-10 PO) Take 1 capsule by mouth daily.   [name] dexlansoprazole (DEXILANT) 60 MG capsule Take 60 mg by mouth daily.  . metoprolol tartrate (LOPRESSOR) 50 MG tablet Take 1 tablet (50 mg total) by mouth 2 (two) times daily.  . Multiple Vitamins-Minerals (CENTRUM SILVER PO) Take 1 tablet by mouth daily.   . Omega-3 Fatty Acids (FISH OIL PO) Take 1 capsule by mouth 2 (two) times daily.  . risedronate (ACTONEL) 35 MG tablet TAKE 1 TABLET BY MOUTH EVERY 7 DAYS WITH WATER ON EMPTY STOMACH, THEN NO FOOD, STAY UPRIGHT 30MINUTE     Allergies:   Codeine; Ilosone [erythromycin]; and Simvastatin   Social History   Social History  . Marital status: Married    Spouse name: N/A  . Number of children: N/A  . Years of  education: N/A   Social History Main Topics  . Smoking status: Never Smoker  . Smokeless tobacco: Never Used  . Alcohol use No  . Drug use: No  . Sexual activity: Not Asked   Other Topics Concern  . None   Social History Narrative   Retired Engineer, structural degree educatation   Married, lives with spouse -     Family History: The patient's family history includes Alzheimer's disease in her father and mother; Congestive Heart Failure in her father; Heart attack in her father, maternal grandfather, maternal grandmother, paternal grandfather, and paternal grandmother; Heart disease in her father; Heart failure in her father; Hyperlipidemia in her father; Prostate cancer in her father;  Stroke in her paternal grandmother. ROS:   Please see the history of present illness.    All other systems reviewed and are negative.  EKGs/Labs/Other Studies Reviewed:    The following studies were reviewed today:  CTA chest:  IMPRESSION: Cardiovascular: Satisfactory opacification of the pulmonary arteries to the segmental level. No evidence of pulmonary embolism. Normal heart size. No pericardial effusion. Atherosclerosis of thoracic aorta is noted without aneurysm formation.No definite evidence of pulmonary embolus. Lungs/Pleura: No pneumothorax or pleural effusion is noted. Minimal bilateral posterior basilar subsegmental atelectasis is noted. 5 mm nodule is seen in the left lower lobe inferiorly best seen on image number 46 of series 5. 5 mm left lower lobe nodule is noted. No follow-up needed if patient is low-risk. Non-contrast chest CT can be considered in 12 months if patient is high-risk. This recommendation follows the consensus statement: Guidelines for Management of Incidental Pulmonary Nodules Detected on CT Images: From the Fleischner Society 2017; Radiology 2017; 284:228-243. Recent Labs: 01/16/2017: ALT 19; Magnesium 2.0; TSH 1.310 04/14/2017: BUN 18; Creatinine, Ser 0.77; Potassium 3.9; Sodium 138 04/17/2017: Hemoglobin 13.4; Platelets 320  Sed rate 12 Recent Lipid Panel    Component Value Date/Time   CHOL 193 01/16/2017 1634   TRIG 355 (H) 01/16/2017 1634   HDL 43 01/16/2017 1634   CHOLHDL 5 03/04/2015 0824   VLDL 20.8 03/04/2015 0824   LDLCALC 79 01/16/2017 1634    Physical Exam:    VS:  BP 112/72 (BP Location: Right Arm, Patient Position: Sitting)   Pulse 69   Ht 5\' 5"  (1.651 m)   Wt 141 lb 12.8 oz (64.3 kg)   SpO2 95%   BMI 23.60 kg/m     Wt Readings from Last 3 Encounters:  04/26/17 141 lb 12.8 oz (64.3 kg)  04/17/17 142 lb (64.4 kg)  02/22/17 144 lb (65.3 kg)     GEN:  Well nourished, well developed in no acute distress HEENT:  Normal NECK: No JVD; No carotid bruits LYMPHATICS: No lymphadenopathy CARDIAC: RRR, no murmurs, rubs, gallops RESPIRATORY:  Clear to auscultation without rales, wheezing or rhonchi  ABDOMEN: Soft, non-tender, non-distended MUSCULOSKELETAL:  No edema; No deformity  SKIN: Warm and dry NEUROLOGIC:  Alert and oriented x 3 PSYCHIATRIC:  Normal affect    Signed, Tina More, MD  04/26/2017 2:23 PM    [place]

## 2017-04-26 ENCOUNTER — Encounter: Payer: Self-pay | Admitting: Cardiology

## 2017-04-26 ENCOUNTER — Ambulatory Visit (INDEPENDENT_AMBULATORY_CARE_PROVIDER_SITE_OTHER): Payer: Medicare Other | Admitting: Cardiology

## 2017-04-26 VITALS — BP 112/72 | HR 69 | Ht 65.0 in | Wt 141.8 lb

## 2017-04-26 DIAGNOSIS — R0781 Pleurodynia: Secondary | ICD-10-CM | POA: Diagnosis not present

## 2017-04-26 NOTE — Patient Instructions (Signed)
Medication Instructions:  Your physician recommends that you continue on your current medications as directed. Please refer to the Current Medication list given to you today.  Labwork: None  Testing/Procedures: None  Follow-Up: Your physician wants you to follow-up in: 9 months. You will receive a reminder letter in the mail two months in advance. If you don't receive a letter, please call our office to schedule the follow-up appointment.   Any Other Special Instructions Will Be Listed Below (If Applicable).     If you need a refill on your cardiac medications before your next appointment, please call your pharmacy.

## 2017-05-09 ENCOUNTER — Ambulatory Visit (HOSPITAL_BASED_OUTPATIENT_CLINIC_OR_DEPARTMENT_OTHER)
Admission: RE | Admit: 2017-05-09 | Discharge: 2017-05-09 | Disposition: A | Discharge status: Home / Self Care | Payer: Medicare Other | Source: Ambulatory Visit | Attending: Cardiology | Admitting: Cardiology

## 2017-05-09 DIAGNOSIS — I071 Rheumatic tricuspid insufficiency: Secondary | ICD-10-CM

## 2017-05-09 DIAGNOSIS — I340 Nonrheumatic mitral (valve) insufficiency: Secondary | ICD-10-CM

## 2017-05-09 DIAGNOSIS — I5189 Other ill-defined heart diseases: Secondary | ICD-10-CM | POA: Insufficient documentation

## 2017-05-09 DIAGNOSIS — R0789 Other chest pain: Secondary | ICD-10-CM | POA: Diagnosis not present

## 2017-05-09 DIAGNOSIS — R079 Chest pain, unspecified: Secondary | ICD-10-CM

## 2017-05-09 DIAGNOSIS — I081 Rheumatic disorders of both mitral and tricuspid valves: Secondary | ICD-10-CM | POA: Insufficient documentation

## 2017-05-09 DIAGNOSIS — I471 Supraventricular tachycardia: Secondary | ICD-10-CM | POA: Diagnosis not present

## 2017-05-09 HISTORY — DX: Rheumatic tricuspid insufficiency: I07.1

## 2017-05-09 HISTORY — DX: Other ill-defined heart diseases: I51.89

## 2017-05-09 HISTORY — DX: Nonrheumatic mitral (valve) insufficiency: I34.0

## 2017-05-09 NOTE — Progress Notes (Signed)
  Echocardiogram 2D Echocardiogram has been performed.  Tina [name] 05/09/2017, 9:53 AM

## 2017-07-27 ENCOUNTER — Encounter (INDEPENDENT_AMBULATORY_CARE_PROVIDER_SITE_OTHER): Payer: Self-pay

## 2017-07-27 ENCOUNTER — Telehealth: Payer: Self-pay

## 2017-07-27 DIAGNOSIS — R091 Pleurisy: Secondary | ICD-10-CM

## 2017-07-27 MED ORDER — CELECOXIB 200 MG PO CAPS: 200.0000 mg | ORAL_CAPSULE | Freq: Two times a day (BID) | ORAL | 2 refills | Status: DC

## 2017-07-27 NOTE — Telephone Encounter (Signed)
See patient e-mail.

## 2017-07-31 LAB — SEDIMENTATION RATE: Sed Rate: 31 mm/hr (ref 0–40)

## 2017-07-31 NOTE — Progress Notes (Signed)
Cardiology Office Note:    Date:  08/03/2017   ID:  Tina [name], DOB 26-Oct-1950, MRN [id number]  PCP:  Tina Hipps, MD  Cardiologist:  Tina More, MD    Referring MD: Tina Hipps, MD    ASSESSMENT:    1. Pleuritis    PLAN:    In order of problems listed above:  1. She will reduce her nonsteroidal anti-inflammatory drug to once a day and 1 [name] week and then discontinue and take as needed in the future.  At this time with recent echocardiogram to normal sedimentation rates and CT of the chest would not perform further diagnostic testing.  If she has a clinical recurrence will consider placing her on a long-term NSAID with and without colchicine.   Next appointment: 6 months   Medication Adjustments/Labs and Tests Ordered: Current medicines are reviewed at length with the patient today.  Concerns regarding medicines are outlined above.  No orders of the defined types were placed in this encounter.  No orders of the defined types were placed in this encounter.   Chief Complaint  Patient presents with  . Follow-up    Blood work    History of Present Illness:    Tina [name] is a 67 y.o. female with a hx of SVT last seen 2 months ago with pleuritic chest pain which is recurrent with normal sed rate.. Compliance with diet, lifestyle and medications: Yes She had recurrent pleuritic chest pain substernal deep breath worse supine took a single dose of her nonsteroidal anti-inflammatory drug and tells me that the symptoms melted away.  There is been no recurrence.  All this has occurred in the setting of increased chronic sinus problems and recent being placed on Singulair.  She has had no fever or chills she has no known connective tissue disease we did a repeat sedimentation rate at the onset of symptoms and remained normal. Past Medical History:  Diagnosis Date  . Abnormal mammogram of right breast 10/2016  . Chest pain in adult    Normal MPS 2016  . Dental  crowns present   . GERD (gastroesophageal reflux disease)   . History of hepatitis as a child    "infectious", per pt.  . Irregular heartbeat    since age 33, per pt.  . Limited joint range of motion    cervical spine - difficulty looking up for extended period  . Seasonal allergies   . SVT (supraventricular tachycardia) (HCC)     Past Surgical History:  Procedure Laterality Date  . ANTERIOR CERVICAL DECOMP/DISCECTOMY FUSION  02/03/2002   C5-6  . CESAREAN SECTION     x 3  . INGUINAL HERNIA REPAIR Right   . LAPAROSCOPIC NISSEN FUNDOPLICATION  42/59/5638  . NISSEN FUNDOPLICATION  75/64/3329   redo  . RADIOACTIVE SEED GUIDED EXCISIONAL BREAST BIOPSY Right 11/08/2016   Procedure: RADIOACTIVE SEED GUIDED EXCISIONAL RIGHT BREAST BIOPSY;  Surgeon: Rolm Bookbinder, MD;  Location: [facility];  Service: General;  Laterality: Right;  . TONSILLECTOMY  1977    Current Medications: No outpatient medications have been marked as taking for the 08/03/17 encounter (Office Visit) with Richardo Priest, MD.     Allergies:   Codeine; Ilosone [erythromycin]; and Simvastatin   Social History   Socioeconomic History  . Marital status: Married    Spouse name: None  . Number of children: None  . Years of education: None  . Highest education level: None  Social Needs  .  Financial resource strain: None  . Food insecurity - worry: None  . Food insecurity - inability: None  . Transportation needs - medical: None  . Transportation needs - non-medical: None  Occupational History  . None  Tobacco Use  . Smoking status: Never Smoker  . Smokeless tobacco: Never Used  Substance and Sexual Activity  . Alcohol use: No  . Drug use: No  . Sexual activity: None  Other Topics Concern  . None  Social History Narrative   Retired Engineer, structural degree educatation   Married, lives with spouse -     Family History: The patient's family history includes  Alzheimer's disease in her father and mother; Congestive Heart Failure in her father; Heart attack in her father, maternal grandfather, maternal grandmother, paternal grandfather, and paternal grandmother; Heart disease in her father; Heart failure in her father; Hyperlipidemia in her father; Prostate cancer in her father; Stroke in her paternal grandmother. ROS:   Please see the history of present illness.    All other systems reviewed and are negative.  EKGs/Labs/Other Studies Reviewed:    The following studies were reviewed today:  Echo 05/09/2017: Impressions: - Low normal EF.   Mild prolaps of the anterior leaflet MV.   Mild MR, TR.  CTA chest 04/18/17:IMPRESSION: No definite evidence of pulmonary embolus. 5 mm left lower lobe nodule is noted. No follow-up needed if patient is low-risk. Non-contrast chest CT can be considered in 12 months if patient is high-risk. This recommendation follows the consensus statement: Guidelines for Management of Incidental Pulmonary Nodules Detected on CT Images: From the Fleischner Society 2017; Radiology 2017; 284:228-243.   Recent Labs: sed rate 07/30/16 normal, 31  01/16/2017: ALT 19; Magnesium 2.0; TSH 1.310 04/14/2017: BUN 18; Creatinine, Ser 0.77; Potassium 3.9; Sodium 138 04/17/2017: Hemoglobin 13.4; Platelets 320  Recent Lipid Panel    Component Value Date/Time   CHOL 193 01/16/2017 1634   TRIG 355 (H) 01/16/2017 1634   HDL 43 01/16/2017 1634   CHOLHDL 5 03/04/2015 0824   VLDL 20.8 03/04/2015 0824   LDLCALC 79 01/16/2017 1634    Physical Exam:    VS:  Ht 5\' 5"  (1.651 m)   Wt 141 lb (64 kg)   BMI 23.46 kg/m     Wt Readings from Last 3 Encounters:  08/03/17 141 lb (64 kg)  04/26/17 141 lb 12.8 oz (64.3 kg)  04/17/17 142 lb (64.4 kg)     GEN:  Well nourished, well developed in no acute distress HEENT: Normal NECK: No JVD; No carotid bruits LYMPHATICS: No lymphadenopathy CARDIAC:  Bilateral CCJ bilaterallyRRR, no murmurs,  rubs, gallops RESPIRATORY:  Clear to auscultation without rales, wheezing or rhonchi  ABDOMEN: Soft, non-tender, non-distended MUSCULOSKELETAL:  No edema; No deformity  SKIN: Warm and dry NEUROLOGIC:  Alert and oriented x 3 PSYCHIATRIC:  Normal affect    Signed, Tina More, MD  08/03/2017 9:58 AM    [place]

## 2017-08-03 ENCOUNTER — Encounter: Payer: Self-pay | Admitting: Cardiology

## 2017-08-03 ENCOUNTER — Ambulatory Visit: Payer: Medicare Other | Admitting: Cardiology

## 2017-08-03 VITALS — BP 100/64 | HR 62 | Ht 65.0 in | Wt 141.0 lb

## 2017-08-03 DIAGNOSIS — R091 Pleurisy: Secondary | ICD-10-CM

## 2017-08-03 NOTE — Patient Instructions (Addendum)
Medication Instructions:  Your physician recommends that you continue on your current medications as directed. Please refer to the Current Medication list given to you today.  Take your celebrex twice a day for 2 weeks then once daily for 1 week   Labwork: None  Testing/Procedures: None  Follow-Up: Your physician recommends that you schedule a follow-up appointment in: July, 2019  Any Other Special Instructions Will Be Listed Below (If Applicable).     If you need a refill on your cardiac medications before your next appointment, please call your pharmacy.   [place], RN, BSN

## 2018-01-18 ENCOUNTER — Encounter: Payer: Self-pay | Admitting: Cardiology

## 2018-01-25 ENCOUNTER — Encounter: Payer: Self-pay | Admitting: Cardiology

## 2018-02-06 ENCOUNTER — Encounter: Payer: Self-pay | Admitting: Cardiology

## 2018-03-06 NOTE — Progress Notes (Signed)
Cardiology Office Note:    Date:  03/07/2018   ID:  Tina [name], DOB 1950/08/18, MRN [id number]  PCP:  Ronita Hipps, MD  Cardiologist:  Shirlee More, MD    Referring MD: Ronita Hipps, MD    ASSESSMENT:    1. SVT (supraventricular tachycardia) ([place] [facility])   2. Myalgia due to statin   3. Pleuritic chest pain   4. Hypercholesterolemia    PLAN:    In order of problems listed above:  1. Stable continue beta-blocker 2. Improved through high intensity statin she has significant dyslipidemia we will place her on low-dose of low intensity statin if tolerated check her lipid profile in 1 month and if above target consider adding Zetia 3. Improved she still gets episodes time to time and takes a tablet of Celebrex with good relief continue the same 4. She has significant muscle toxicity associate high intensity statin resolved with withdrawal we will rechallenge with a low-dose of low intensity statin if tolerated follow-up lipid profile in 1 month   Next appointment: One year   Medication Adjustments/Labs and Tests Ordered: Current medicines are reviewed at length with the patient today.  Concerns regarding medicines are outlined above.  Orders Placed This Encounter  Procedures  . Lipid Profile  . CK (Creatine Kinase)  . EKG 12-Lead   Meds ordered this encounter  Medications  . pravastatin (PRAVACHOL) 20 MG tablet    Sig: Take 1 tablet (20 mg total) by mouth every evening.    Dispense:  30 tablet    Refill:  3    Chief Complaint  Patient presents with  . Follow-up    SVT    History of Present Illness:    Tina [name] is a 67 y.o. female with a hx of supraventricular tachycardia and pleuritic chest pain with pleuritis last seen 08/03/2017. Compliance with diet, lifestyle and medications: Yes  She is doing better no recurrent episodes of palpitation rapid heart rhythm shortness of breath or syncope.  Her muscle symptoms resolved with prolonged Lipitor.  After  discussion of benefits risk she wants to restart a low intensity statin.  Intermittently she has mild focal chest wall pain relieved with a tablet of Celebrex Past Medical History:  Diagnosis Date  . Abnormal mammogram of right breast 10/2016  . Chest pain in adult    Normal MPS 2016  . Dental crowns present   . GERD (gastroesophageal reflux disease)   . History of hepatitis as a child    "infectious", per pt.  . Irregular heartbeat    since age 61, per pt.  . Limited joint range of motion    cervical spine - difficulty looking up for extended period  . Seasonal allergies   . SVT (supraventricular tachycardia) (HCC)     Past Surgical History:  Procedure Laterality Date  . ANTERIOR CERVICAL DECOMP/DISCECTOMY FUSION  02/03/2002   C5-6  . CESAREAN SECTION     x 3  . INGUINAL HERNIA REPAIR Right   . LAPAROSCOPIC NISSEN FUNDOPLICATION  88/41/6606  . NISSEN FUNDOPLICATION  30/16/0109   redo  . RADIOACTIVE SEED GUIDED EXCISIONAL BREAST BIOPSY Right 11/08/2016   Procedure: RADIOACTIVE SEED GUIDED EXCISIONAL RIGHT BREAST BIOPSY;  Surgeon: Rolm Bookbinder, MD;  Location: [facility];  Service: General;  Laterality: Right;  . TONSILLECTOMY  1977    Current Medications: Current Meds  Medication Sig  . aspirin 81 MG tablet Take 81 mg by mouth daily.    . Beclomethasone  Dipropionate (QVAR IN) Inhale 1 puff into the lungs daily.  . celecoxib (CELEBREX) 200 MG capsule Take 200 mg by mouth 2 (two) times daily as needed.  . cetirizine (ZYRTEC) 10 MG tablet TAKE 1 TABLET (10 MG TOTAL) BY MOUTH DAILY.  [name] Coenzyme Q10 (COQ-10 PO) Take 1 capsule by mouth daily.   [name] dexlansoprazole (DEXILANT) 60 MG capsule Take 60 mg by mouth daily.  . metoprolol tartrate (LOPRESSOR) 50 MG tablet Take 1 tablet (50 mg total) by mouth 2 (two) times daily.  . montelukast (SINGULAIR) 10 MG tablet Take 10 mg by mouth at bedtime.  . Multiple Vitamins-Minerals (CENTRUM SILVER PO) Take 1 tablet by mouth  daily.   . Omega-3 Fatty Acids (FISH OIL PO) Take 1 capsule by mouth 2 (two) times daily.  . risedronate (ACTONEL) 35 MG tablet TAKE 1 TABLET BY MOUTH EVERY 7 DAYS WITH WATER ON EMPTY STOMACH, THEN NO FOOD, STAY UPRIGHT 30MINUTE     Allergies:   Codeine; Ilosone [erythromycin]; and Simvastatin   Social History   Socioeconomic History  . Marital status: Married    Spouse name: Not on file  . Number of children: Not on file  . Years of education: Not on file  . Highest education level: Not on file  Occupational History  . Not on file  Social Needs  . Financial resource strain: Not on file  . Food insecurity:    Worry: Not on file    Inability: Not on file  . Transportation needs:    Medical: Not on file    Non-medical: Not on file  Tobacco Use  . Smoking status: Never Smoker  . Smokeless tobacco: Never Used  Substance and Sexual Activity  . Alcohol use: No  . Drug use: No  . Sexual activity: Not on file  Lifestyle  . Physical activity:    Days per week: Not on file    Minutes per session: Not on file  . Stress: Not on file  Relationships  . Social connections:    Talks on phone: Not on file    Gets together: Not on file    Attends religious service: Not on file    Active member of club or organization: Not on file    Attends meetings of clubs or organizations: Not on file    Relationship status: Not on file  Other Topics Concern  . Not on file  Social History Narrative   Retired high school business teacher   Master's degree educatation   Married, lives with spouse -     Family History: The patient's family history includes Alzheimer's disease in her father and mother; Congestive Heart Failure in her father; Heart attack in her father, maternal grandfather, maternal grandmother, paternal grandfather, and paternal grandmother; Heart disease in her father; Heart failure in her father; Hyperlipidemia in her father; Prostate cancer in her father; Stroke in her paternal  grandmother. ROS:   Please see the history of present illness.    All other systems reviewed and are negative.  EKGs/Labs/Other Studies Reviewed:    The following studies were reviewed today:  EKG:  EKG ordered today.  The ekg ordered today demonstrates sinus rhythm normal  CTA chest : IMPRESSION: No definite evidence of pulmonary embolus.  5 mm left lower lobe nodule is noted. No follow-up needed if patient is low-risk. Non-contrast chest CT can be considered in 12 months if patient is high-risk.  Recent Labs: 04/14/2017: BUN 18; Creatinine, Ser 0.77; Potassium 3.9; Sodium  138 04/17/2017: Hemoglobin 13.4; Platelets 320  Recent Lipid Panel    Component Value Date/Time   CHOL 193 01/16/2017 1634   TRIG 355 (H) 01/16/2017 1634   HDL 43 01/16/2017 1634   CHOLHDL 5 03/04/2015 0824   VLDL 20.8 03/04/2015 0824   LDLCALC 79 01/16/2017 1634    Physical Exam:    VS:  BP 102/62 (BP Location: Left Arm, Patient Position: Sitting, Cuff Size: Normal)   Pulse 72   Ht 5\' 5"  (1.651 m)   Wt 144 lb 3.2 oz (65.4 kg)   SpO2 97%   BMI 24.00 kg/m     Wt Readings from Last 3 Encounters:  03/07/18 144 lb 3.2 oz (65.4 kg)  08/03/17 141 lb (64 kg)  04/26/17 141 lb 12.8 oz (64.3 kg)     GEN:  Well nourished, well developed in no acute distress HEENT: Normal NECK: No JVD; No carotid bruits LYMPHATICS: No lymphadenopathy CARDIAC: RRR, no murmurs, rubs, gallops RESPIRATORY:  Clear to auscultation without rales, wheezing or rhonchi  ABDOMEN: Soft, non-tender, non-distended MUSCULOSKELETAL:  No edema; No deformity  SKIN: Warm and dry NEUROLOGIC:  Alert and oriented x 3 PSYCHIATRIC:  Normal affect    Signed, Shirlee More, MD  03/07/2018 9:12 AM    [place]

## 2018-03-07 ENCOUNTER — Encounter: Payer: Self-pay | Admitting: Cardiology

## 2018-03-07 ENCOUNTER — Ambulatory Visit: Payer: Medicare Other | Admitting: Cardiology

## 2018-03-07 VITALS — BP 102/62 | HR 72 | Ht 65.0 in | Wt 144.2 lb

## 2018-03-07 DIAGNOSIS — R0781 Pleurodynia: Secondary | ICD-10-CM | POA: Diagnosis not present

## 2018-03-07 DIAGNOSIS — T466X5A Adverse effect of antihyperlipidemic and antiarteriosclerotic drugs, initial encounter: Secondary | ICD-10-CM | POA: Insufficient documentation

## 2018-03-07 DIAGNOSIS — I471 Supraventricular tachycardia: Secondary | ICD-10-CM

## 2018-03-07 DIAGNOSIS — E7800 Pure hypercholesterolemia, unspecified: Secondary | ICD-10-CM

## 2018-03-07 DIAGNOSIS — M791 Myalgia, unspecified site: Secondary | ICD-10-CM | POA: Insufficient documentation

## 2018-03-07 HISTORY — DX: Adverse effect of antihyperlipidemic and antiarteriosclerotic drugs, initial encounter: T46.6X5A

## 2018-03-07 HISTORY — DX: Myalgia, unspecified site: M79.10

## 2018-03-07 MED ORDER — PRAVASTATIN SODIUM 20 MG PO TABS: 20.0000 mg | ORAL_TABLET | Freq: Every evening | ORAL | 3 refills | Status: DC

## 2018-03-07 NOTE — Patient Instructions (Signed)
Medication Instructions:  Your physician has recommended you make the following change in your medication:   START pravastatin 20 mg daily  Labwork: Your physician recommends that you return for lab work in 1 month: lipid panel, CPK. Please return to our [place] office, no appointment needed. Please fast beforehand.   Testing/Procedures: You had an EKG today.   Follow-Up: Your physician wants you to follow-up in: 1 year. You will receive a reminder letter in the mail two months in advance. If you don't receive a letter, please call our office to schedule the follow-up appointment.   If you need a refill on your cardiac medications before your next appointment, please call your pharmacy.   Thank you for choosing [facility]! Robyne Peers, RN [phone]   Pravastatin tablets What is this medicine? PRAVASTATIN (PRA va stat in) is known as a HMG-CoA reductase inhibitor or 'statin'. It lowers the level of cholesterol and triglycerides in the blood. This drug may also reduce the risk of heart attack, stroke, or other health problems in patients with risk factors for heart disease. Diet and lifestyle changes are often used with this drug. This medicine may be used for other purposes; ask your health care provider or pharmacist if you have questions. COMMON BRAND NAME(S): Pravachol What should I tell my health care provider before I take this medicine? They need to know if you have any of these conditions: -frequently drink alcoholic beverages -kidney disease -liver disease -muscle aches or weakness -other medical condition -an unusual or allergic reaction to pravastatin, other medicines, foods, dyes, or preservatives -pregnant or trying to get pregnant -breast-feeding How should I use this medicine? Take pravastatin tablets by mouth. Swallow the tablets with a drink of water. Pravastatin can be taken at anytime of the day, with or without food. Follow the directions on the  prescription label. Take your doses at regular intervals. Do not take your medicine more often than directed. Talk to your pediatrician regarding the use of this medicine in children. Special care may be needed. Pravastatin has been used in children as young as 7 years of age. Overdosage: If you think you have taken too much of this medicine contact a poison control center or emergency room at once. NOTE: This medicine is only for you. Do not share this medicine with others. What if I miss a dose? If you miss a dose, take it as soon as you can. If it is almost time for your next dose, take only that dose. Do not take double or extra doses. What may interact with this medicine? This medicine may interact with the following medications: -colchicine -cyclosporine -other medicines for high cholesterol -some antibiotics like azithromycin, clarithromycin, erythromycin, and telithromycin This list may not describe all possible interactions. Give your health care provider a list of all the medicines, herbs, non-prescription drugs, or dietary supplements you use. Also tell them if you smoke, drink alcohol, or use illegal drugs. Some items may interact with your medicine. What should I watch for while using this medicine? Visit your doctor or health care professional for regular check-ups. You may need regular tests to make sure your liver is working properly. Tell your doctor or health care professional right away if you get any unexplained muscle pain, tenderness, or weakness, especially if you also have a fever and tiredness. Your doctor or health care professional may tell you to stop taking this medicine if you develop muscle problems. If your muscle problems do not go away after  stopping this medicine, contact your health care professional. This drug is only part of a total heart-health program. Your doctor or a dietician can suggest a low-cholesterol and low-fat diet to help. Avoid alcohol and smoking,  and keep a proper exercise schedule. Do not use this drug if you are pregnant or breast-feeding. Serious side effects to an unborn child or to an infant are possible. Talk to your doctor or pharmacist for more information. This medicine may affect blood sugar levels. If you have diabetes, check with your doctor or health care professional before you change your diet or the dose of your diabetic medicine. If you are going to have surgery tell your health care professional that you are taking this drug. What side effects may I notice from receiving this medicine? Side effects that you should report to your doctor or health care professional as soon as possible: -allergic reactions like skin rash, itching or hives, swelling of the face, lips, or tongue -dark urine -fever -muscle pain, cramps, or weakness -redness, blistering, peeling or loosening of the skin, including inside the mouth -trouble passing urine or change in the amount of urine -unusually weak or tired -yellowing of the eyes or skin Side effects that usually do not require medical attention (report to your doctor or health care professional if they continue or are bothersome): -gas -headache -heartburn -indigestion -stomach pain This list may not describe all possible side effects. Call your doctor for medical advice about side effects. You may report side effects to FDA at 1-800-FDA-1088. Where should I keep my medicine? Keep out of the reach of children. Store at room temperature between 15 to 30 degrees C (59 to 86 degrees F). Protect from light. Keep container tightly closed. Throw away any unused medicine after the expiration date. NOTE: This sheet is a summary. It may not cover all possible information. If you have questions about this medicine, talk to your doctor, pharmacist, or health care provider.  2018 Elsevier/Gold Standard (2015-01-28 16:00:27)   Creatine Kinase Test Why am I having this test? The creatine  kinase (CK) test is performed to determine if there has been damage to muscle tissue in your body. This test can be used to help diagnose heart attack, neurologic diseases, or skeletal diseases. Three different forms of CK are present in your body. They are referred to as isoenzymes:  CK-MM is found in your skeletal muscles and heart.  CK-MB is found mostly in your heart.  CK-BB is found mostly in your brain.  What kind of sample is taken? A blood sample is required for this test. It is usually collected by inserting a needle into a vein. How do I prepare for this test? There is no preparation required for this test. Be aware that your health care provider may require blood samples to be taken at regular intervals for up to 1 week. What are the reference ranges? Reference ranges are considered healthy ranges established after testing a large group of healthy people. Reference ranges may vary among different people, labs, and hospitals. It is your responsibility to obtain your test results. Ask the lab or department performing the test when and how you will get your results. The reference ranges for the CK test are as follows: Total CK:  Adult or elderly (values are higher after exercise): ? Female: 55-170 units/L or 55-170 units/L (SI units). ? Female: 30-135 units/L or 30-135 units/L (SI units).  Newborn: 68-580 units/L (SI units). Isoenzymes:  CK-MM: 100%.  CK-MB:  0%.  CK-BB: 0%. What do the results mean? Levels of total CK that are above the reference ranges may indicate injury or diseases affecting the heart, skeletal muscle, or brain. Increased levels of CK-MM isoenzyme may indicate:  Certain diseases affecting the skeletal muscle.  Recent surgery, trauma, or injury.  Conditions that cause convulsions.  Increased levels of CK-MB isoenzyme may indicate:  Recent heart attack.  Other conditions that cause injury to the heart muscle.  Increased levels of CK-BB isoenzyme  may indicate:  Diseases that affect the central nervous system.  Certain psychiatric therapies.  Certain types of cancer.  Injury to the lungs.  Talk with your health care provider to discuss your results, treatment options, and if necessary, the need for more tests. Talk with your health care provider if you have any questions about your results. Talk with your health care provider to discuss your results, treatment options, and if necessary, the need for more tests. Talk with your health care provider if you have any questions about your results. This information is not intended to replace advice given to you by your health care provider. Make sure you discuss any questions you have with your health care provider. Document Released: 08/03/2004 Document Revised: 03/07/2016 Document Reviewed: 11/27/2013 Elsevier Interactive Patient Education  [employer].

## 2018-03-13 ENCOUNTER — Other Ambulatory Visit: Payer: Self-pay | Admitting: Emergency Medicine

## 2018-03-13 ENCOUNTER — Other Ambulatory Visit: Payer: Self-pay

## 2018-03-13 MED ORDER — METOPROLOL TARTRATE 50 MG PO TABS: 50.0000 mg | ORAL_TABLET | Freq: Two times a day (BID) | ORAL | 1 refills | Status: DC

## 2018-03-14 ENCOUNTER — Other Ambulatory Visit: Payer: Self-pay | Admitting: Emergency Medicine

## 2018-03-14 MED ORDER — METOPROLOL TARTRATE 50 MG PO TABS: 50.0000 mg | ORAL_TABLET | Freq: Two times a day (BID) | ORAL | 3 refills | Status: DC

## 2018-03-14 NOTE — Telephone Encounter (Signed)
Medication refilled

## 2018-04-08 LAB — LIPID PANEL
Chol/HDL Ratio: 4.2 ratio (ref 0.0–4.4)
Cholesterol, Total: 192 mg/dL (ref 100–199)
HDL: 46 mg/dL (ref >39)
LDL Calculated: 116 mg/dL — ABNORMAL HIGH (ref 0–99)
TRIGLYCERIDES: 152 mg/dL — AB (ref 0–149)
VLDL Cholesterol Cal: 30 mg/dL (ref 5–40)

## 2018-04-08 LAB — CK: Total CK: 36 U/L (ref 24–173)

## 2018-04-22 ENCOUNTER — Other Ambulatory Visit: Payer: Self-pay

## 2018-04-22 ENCOUNTER — Encounter (HOSPITAL_BASED_OUTPATIENT_CLINIC_OR_DEPARTMENT_OTHER): Payer: Self-pay

## 2018-04-22 NOTE — Progress Notes (Signed)
Spoke with: "Cindy" NPO:  After Midnight, no gum, candy, or mints   Arrival time: 1000AM Labs: Istat 4 (EKG 03/07/2018) AM medications: Qvar, Cetrizine, Dexlansoprazole, Metoprolol Pre op orders: No orders Ride home:  Lucilla Lame (husband) [phone] Asked patient to bring prescription medications with her in case she stays overnight

## 2018-04-25 ENCOUNTER — Other Ambulatory Visit: Payer: Self-pay | Admitting: Obstetrics and Gynecology

## 2018-04-25 NOTE — H&P (Signed)
67 y.o. multiparous pt with SUI and cystocele but well suspended uterus. Previously:" Seeing Drs in [place] for pap smears. Now having worsening of leaking urine. Will soak a super pad 8 times a days and having irritation from pads. No urge. Will leak even if just emptied. No bleeding at all. No irritation except with pads. Has severe atrophy but has stopped cream and no longer having sex." "Pt. here for AEX; Pt. c/o SUI with laughing, coughing, sneezing and has gotten worse, wants to discuss surgery; Pt. states she had pap with PCP in 2016 and was told it was abnormal or there was a bacterial infection?, was repeated and normal in 2017 per pt.; LMP- age 6, denies any bleeding, no hormones; Mammo. on 02/2017, scheduled at [facility] 02/25/18; colo.-2010; BMD-2014/tb"   Past Medical History:  Diagnosis Date  . Abnormal mammogram of right breast 10/2016  . Aortic atherosclerosis ([place])   . Arthritis    right Thumb  . Chest pain in adult    Normal MPS 2016  . Complication of anesthesia    prolonged sedation  . Dental crowns present   . Dupuytren contracture    Bilateral hands, mild  . GERD (gastroesophageal reflux disease)   . Gestational diabetes    history of  . Grade I diastolic dysfunction 30/86/5784   on ECHO  . 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 History of blood transfusion 1982  . History of cardiomegaly 04/14/2017   Noted on CXR  . History of hepatitis as a child    "infectious", per pt.  . History of hiatal hernia   . History of pleurisy   . History of pneumonia   . Hyperlipidemia   . Irregular heartbeat    since age 13, per pt.  . Limited joint range of motion    cervical spine - difficulty looking up for extended period  . MR (mitral regurgitation) 05/09/2017   Mild, noted on ECHO  . MVP (mitral valve prolapse)    Mild  . Orthostatic hypotension   . Osteoporosis   . Pulmonary nodule 04/18/2017   5 mm left lower lobe nodule   . Seasonal allergies   . SUI  (stress urinary incontinence, female)   . SVT (supraventricular tachycardia) ([place])   . TR (tricuspid regurgitation) 05/09/2017   Mild, noted on ECHO  . Wears contact lenses   . Wears glasses    Past Surgical History:  Procedure Laterality Date  . ANTERIOR CERVICAL DECOMP/DISCECTOMY FUSION  02/03/2002   C5-6  . CESAREAN SECTION     x 3  . COLONOSCOPY  2013   several  . INGUINAL HERNIA REPAIR Right   . LAPAROSCOPIC NISSEN FUNDOPLICATION  69/62/9528  . NISSEN FUNDOPLICATION  41/32/4401   redo  . RADIOACTIVE SEED GUIDED EXCISIONAL BREAST BIOPSY Right 11/08/2016   Procedure: RADIOACTIVE SEED GUIDED EXCISIONAL RIGHT BREAST BIOPSY;  Surgeon: Rolm Bookbinder, MD;  Location: [facility];  Service: General;  Laterality: Right;  . TONSILLECTOMY  1977  . TUBAL LIGATION    . UPPER GI ENDOSCOPY     x2  . WISDOM TOOTH EXTRACTION      Social History   Socioeconomic History  . Marital status: Married    Spouse name: Not on file  . Number of children: Not on file  . Years of education: Not on file  . Highest education level: Not on file  Occupational History  . Not on file  Social Needs  . Emergency planning/management officer  strain: Not on file  . Food insecurity:    Worry: Not on file    Inability: Not on file  . Transportation needs:    Medical: Not on file    Non-medical: Not on file  Tobacco Use  . Smoking status: Never Smoker  . Smokeless tobacco: Never Used  Substance and Sexual Activity  . Alcohol use: No  . Drug use: No  . Sexual activity: Not on file  Lifestyle  . Physical activity:    Days per week: Not on file    Minutes per session: Not on file  . Stress: Not on file  Relationships  . Social connections:    Talks on phone: Not on file    Gets together: Not on file    Attends religious service: Not on file    Active member of club or organization: Not on file    Attends meetings of clubs or organizations: Not on file    Relationship status: Not on file  .  Intimate partner violence:    Fear of current or ex partner: Not on file    Emotionally abused: Not on file    Physically abused: Not on file    Forced sexual activity: Not on file  Other Topics Concern  . Not on file  Social History Narrative   Retired high school business teacher   Master's degree educatation   Married, lives with spouse -    No current facility-administered medications on file prior to encounter.    Current Outpatient Medications on File Prior to Encounter  Medication Sig Dispense Refill  . aspirin 81 MG tablet Take 81 mg by mouth daily.      . cetirizine (ZYRTEC) 10 MG tablet TAKE 1 TABLET (10 MG TOTAL) BY MOUTH DAILY. 30 tablet 11  . Coenzyme Q10 (COQ-10 PO) Take 1 capsule by mouth daily.     [name] dexlansoprazole (DEXILANT) 60 MG capsule Take 60 mg by mouth daily.    . montelukast (SINGULAIR) 10 MG tablet Take 10 mg by mouth at bedtime.    . Multiple Vitamins-Minerals (CENTRUM SILVER PO) Take 1 tablet by mouth daily.     . Omega-3 Fatty Acids (FISH OIL PO) Take 1 capsule by mouth daily.     . risedronate (ACTONEL) 35 MG tablet TAKE 1 TABLET BY MOUTH EVERY 7 DAYS WITH WATER ON EMPTY STOMACH, THEN NO FOOD, STAY UPRIGHT 30MINUTE 4 tablet 11    Allergies  Allergen Reactions  . Codeine Nausea And Vomiting and Nausea Only  . Ilosone [Erythromycin] Other (See Comments)    TACHYCARDIA  . Simvastatin Other (See Comments)    LEG PAIN    Vitals:   04/22/18 1543  Weight: 64.9 kg  Height: 5\' 4"  (1.626 m)    Lungs: clear to ascultation Cor:  RRR Abdomen:  soft, nontender, nondistended. Ex:  no cords, erythema Pelvic:   Vulva: no masses, no atrophy, no lesions\ls1   Vagina: no tenderness, no erythema, no abnormal vaginal discharge, no vesicle(s) or ulcers, no rectocele (none), cystocele (-1)\ls1   Cervix: grossly normal, no discharge, no cervical motion tenderness\ls1   Uterus: normal size (7), normal shape, midline, no uterine prolapse, mobile, non-tender (no  prolapse at all.)\ls1   Bladder/Urethra: no urethral discharge, no urethral mass, bladder non distended, Urethra hypermobile (>45)\ls1   Adnexa/Parametria: no parametrial tenderness, no parametrial mass, no adnexal tenderness, no ovarian mass  A:  Isolated SUI and cystocele.  No uterine prolapse or bleeding.  For TVT, A  repair and cysto.   P: P: All risks, benefits and alternatives d/w patient and she desires to proceed.  Patient has undergone a modified diet, ERAS protocol and will receive preop antibiotics and SCDs during the operation.   Pt to have extended recovery but will go home same day if eating, ambulatin and pain control is good.  Poonam Woehrle A

## 2018-04-26 ENCOUNTER — Other Ambulatory Visit: Payer: Self-pay

## 2018-04-26 ENCOUNTER — Ambulatory Visit (HOSPITAL_BASED_OUTPATIENT_CLINIC_OR_DEPARTMENT_OTHER): Payer: Medicare Other | Admitting: Anesthesiology

## 2018-04-26 ENCOUNTER — Ambulatory Visit (HOSPITAL_BASED_OUTPATIENT_CLINIC_OR_DEPARTMENT_OTHER)
Admission: RE | Admit: 2018-04-26 | Discharge: 2018-04-26 | Disposition: A | Discharge status: Home / Self Care | Payer: Medicare Other | Source: Ambulatory Visit | Attending: Obstetrics and Gynecology | Admitting: Obstetrics and Gynecology

## 2018-04-26 ENCOUNTER — Encounter (HOSPITAL_BASED_OUTPATIENT_CLINIC_OR_DEPARTMENT_OTHER)
Admission: RE | Disposition: A | Discharge status: Home / Self Care | Payer: Self-pay | Source: Ambulatory Visit | Attending: Obstetrics and Gynecology

## 2018-04-26 ENCOUNTER — Encounter (HOSPITAL_BASED_OUTPATIENT_CLINIC_OR_DEPARTMENT_OTHER): Payer: Self-pay | Admitting: Emergency Medicine

## 2018-04-26 ENCOUNTER — Other Ambulatory Visit: Payer: Self-pay | Admitting: Obstetrics and Gynecology

## 2018-04-26 DIAGNOSIS — N393 Stress incontinence (female) (male): Secondary | ICD-10-CM | POA: Diagnosis present

## 2018-04-26 DIAGNOSIS — Z7982 Long term (current) use of aspirin: Secondary | ICD-10-CM | POA: Diagnosis not present

## 2018-04-26 DIAGNOSIS — M1811 Unilateral primary osteoarthritis of first carpometacarpal joint, right hand: Secondary | ICD-10-CM | POA: Diagnosis not present

## 2018-04-26 DIAGNOSIS — Z79899 Other long term (current) drug therapy: Secondary | ICD-10-CM | POA: Insufficient documentation

## 2018-04-26 DIAGNOSIS — E785 Hyperlipidemia, unspecified: Secondary | ICD-10-CM | POA: Diagnosis not present

## 2018-04-26 DIAGNOSIS — Z885 Allergy status to narcotic agent status: Secondary | ICD-10-CM | POA: Diagnosis not present

## 2018-04-26 DIAGNOSIS — N8110 Cystocele, unspecified: Secondary | ICD-10-CM | POA: Diagnosis not present

## 2018-04-26 DIAGNOSIS — I341 Nonrheumatic mitral (valve) prolapse: Secondary | ICD-10-CM | POA: Insufficient documentation

## 2018-04-26 DIAGNOSIS — K219 Gastro-esophageal reflux disease without esophagitis: Secondary | ICD-10-CM | POA: Diagnosis not present

## 2018-04-26 DIAGNOSIS — K449 Diaphragmatic hernia without obstruction or gangrene: Secondary | ICD-10-CM | POA: Diagnosis not present

## 2018-04-26 DIAGNOSIS — Z881 Allergy status to other antibiotic agents status: Secondary | ICD-10-CM | POA: Insufficient documentation

## 2018-04-26 HISTORY — DX: Gestational diabetes mellitus in pregnancy, unspecified control: O24.419

## 2018-04-26 HISTORY — DX: Rheumatic tricuspid insufficiency: I07.1

## 2018-04-26 HISTORY — DX: Nonrheumatic mitral (valve) insufficiency: I34.0

## 2018-04-26 HISTORY — PX: CYSTOCELE REPAIR: SHX163

## 2018-04-26 HISTORY — DX: Personal history of other medical treatment: Z92.89

## 2018-04-26 HISTORY — DX: Hyperlipidemia, unspecified: E78.5

## 2018-04-26 HISTORY — DX: Personal history of other diseases of the digestive system: Z87.19

## 2018-04-26 HISTORY — DX: Personal history of other diseases of the respiratory system: Z87.09

## 2018-04-26 HISTORY — DX: Inflammatory liver disease, unspecified: K75.9

## 2018-04-26 HISTORY — PX: BLADDER SUSPENSION: SHX72

## 2018-04-26 HISTORY — DX: Presence of spectacles and contact lenses: Z97.3

## 2018-04-26 HISTORY — DX: Age-related osteoporosis without current pathological fracture: M81.0

## 2018-04-26 HISTORY — DX: Other specified postprocedural states: Z98.890

## 2018-04-26 HISTORY — DX: Unspecified osteoarthritis, unspecified site: M19.90

## 2018-04-26 HISTORY — DX: Personal history of other diseases of the circulatory system: Z86.79

## 2018-04-26 HISTORY — DX: Stress incontinence (female) (male): N39.3

## 2018-04-26 HISTORY — DX: Orthostatic hypotension: I95.1

## 2018-04-26 HISTORY — DX: Nausea with vomiting, unspecified: R11.2

## 2018-04-26 HISTORY — DX: Other complications of anesthesia, initial encounter: T88.59XA

## 2018-04-26 HISTORY — DX: Personal history of pneumonia (recurrent): Z87.01

## 2018-04-26 HISTORY — DX: Adverse effect of unspecified anesthetic, initial encounter: T41.45XA

## 2018-04-26 HISTORY — DX: Solitary pulmonary nodule: R91.1

## 2018-04-26 HISTORY — DX: Atherosclerosis of aorta: I70.0

## 2018-04-26 HISTORY — PX: CYSTOSCOPY: SHX5120

## 2018-04-26 HISTORY — DX: Nonrheumatic mitral (valve) prolapse: I34.1

## 2018-04-26 HISTORY — DX: Heart disease, unspecified: I51.9

## 2018-04-26 HISTORY — DX: Palmar fascial fibromatosis (dupuytren): M72.0

## 2018-04-26 LAB — TYPE AND SCREEN
ABO/RH(D): A NEG
Antibody Screen: NEGATIVE

## 2018-04-26 LAB — CBC
HCT: 40.3 % (ref 36.0–46.0)
HEMOGLOBIN: 13 g/dL (ref 12.0–15.0)
MCH: 28.7 pg (ref 26.0–34.0)
MCHC: 32.3 g/dL (ref 30.0–36.0)
MCV: 89 fL (ref 80.0–100.0)
Platelets: 243 10*3/uL (ref 150–400)
RBC: 4.53 MIL/uL (ref 3.87–5.11)
RDW: 12.1 % (ref 11.5–15.5)
WBC: 6.6 10*3/uL (ref 4.0–10.5)
nRBC: 0 % (ref 0.0–0.2)

## 2018-04-26 LAB — POCT I-STAT 4, (NA,K, GLUC, HGB,HCT)
Glucose, Bld: 91 mg/dL (ref 70–99)
HCT: 39 % (ref 36.0–46.0)
Hemoglobin: 13.3 g/dL (ref 12.0–15.0)
POTASSIUM: 4.1 mmol/L (ref 3.5–5.1)
SODIUM: 141 mmol/L (ref 135–145)

## 2018-04-26 LAB — ABO/RH: ABO/RH(D): A NEG

## 2018-04-26 SURGERY — URETHROPEXY, USING TRANSVAGINAL TAPE
Anesthesia: General

## 2018-04-26 MED ORDER — CEFAZOLIN SODIUM-DEXTROSE 2-4 GM/100ML-% IV SOLN
2.0000 g | INTRAVENOUS | Status: AC
  Administered 2018-04-26: 2 g via INTRAVENOUS
  Filled 2018-04-26: qty 100

## 2018-04-26 MED ORDER — FENTANYL CITRATE (PF) 100 MCG/2ML IJ SOLN
25.0000 ug | INTRAMUSCULAR | Status: DC | PRN
  Filled 2018-04-26: qty 1

## 2018-04-26 MED ORDER — IBUPROFEN 800 MG PO TABS: 800.0000 mg | ORAL_TABLET | Freq: Three times a day (TID) | ORAL | 0 refills | Status: DC | PRN

## 2018-04-26 MED ORDER — FENTANYL CITRATE (PF) 100 MCG/2ML IJ SOLN
INTRAMUSCULAR | Status: AC
  Filled 2018-04-26: qty 2

## 2018-04-26 MED ORDER — SCOPOLAMINE 1 MG/3DAYS TD PT72
1.0000 | MEDICATED_PATCH | TRANSDERMAL | Status: DC
  Filled 2018-04-26: qty 1

## 2018-04-26 MED ORDER — ESTRADIOL 0.1 MG/GM VA CREA
TOPICAL_CREAM | VAGINAL | Status: DC | PRN
  Administered 2018-04-26: 1 via VAGINAL

## 2018-04-26 MED ORDER — ONDANSETRON HCL 4 MG/2ML IJ SOLN
INTRAMUSCULAR | Status: DC | PRN
  Administered 2018-04-26: 4 mg via INTRAVENOUS

## 2018-04-26 MED ORDER — OXYCODONE HCL 5 MG PO TABS
5.0000 mg | ORAL_TABLET | Freq: Once | ORAL | Status: AC | PRN
  Administered 2018-04-26: 5 mg via ORAL
  Filled 2018-04-26: qty 1

## 2018-04-26 MED ORDER — SOD CITRATE-CITRIC ACID 500-334 MG/5ML PO SOLN
30.0000 mL | ORAL | Status: AC
  Administered 2018-04-26: 15 mL via ORAL
  Filled 2018-04-26: qty 30

## 2018-04-26 MED ORDER — EPHEDRINE SULFATE 50 MG/ML IJ SOLN
INTRAMUSCULAR | Status: DC | PRN
  Administered 2018-04-26 (×3): 10 mg via INTRAVENOUS

## 2018-04-26 MED ORDER — OXYCODONE HCL 5 MG PO TABS
ORAL_TABLET | ORAL | Status: AC
  Filled 2018-04-26: qty 1

## 2018-04-26 MED ORDER — CELECOXIB 200 MG PO CAPS
ORAL_CAPSULE | ORAL | Status: AC
  Filled 2018-04-26: qty 2

## 2018-04-26 MED ORDER — LIDOCAINE HCL (CARDIAC) PF 100 MG/5ML IV SOSY
PREFILLED_SYRINGE | INTRAVENOUS | Status: DC | PRN
  Administered 2018-04-26: 60 mg via INTRAVENOUS

## 2018-04-26 MED ORDER — ONDANSETRON HCL 4 MG/2ML IJ SOLN
4.0000 mg | Freq: Once | INTRAMUSCULAR | Status: DC | PRN
  Filled 2018-04-26: qty 2

## 2018-04-26 MED ORDER — ACETAMINOPHEN 160 MG/5ML PO SOLN
325.0000 mg | ORAL | Status: DC | PRN
  Filled 2018-04-26: qty 20.3

## 2018-04-26 MED ORDER — OXYCODONE HCL 5 MG PO TABS: 5.0000 mg | ORAL_TABLET | Freq: Four times a day (QID) | ORAL | 0 refills | Status: DC | PRN

## 2018-04-26 MED ORDER — FENTANYL CITRATE (PF) 100 MCG/2ML IJ SOLN
INTRAMUSCULAR | Status: DC | PRN
  Administered 2018-04-26 (×2): 50 ug via INTRAVENOUS

## 2018-04-26 MED ORDER — LIDOCAINE-EPINEPHRINE 0.5 %-1:200000 IJ SOLN
INTRAMUSCULAR | Status: DC | PRN
  Administered 2018-04-26: 10 mL

## 2018-04-26 MED ORDER — LACTATED RINGERS IV SOLN
INTRAVENOUS | Status: DC
  Administered 2018-04-26 (×2): via INTRAVENOUS
  Filled 2018-04-26: qty 1000

## 2018-04-26 MED ORDER — DEXAMETHASONE SODIUM PHOSPHATE 4 MG/ML IJ SOLN
4.0000 mg | INTRAMUSCULAR | Status: DC
  Filled 2018-04-26: qty 1

## 2018-04-26 MED ORDER — LACTATED RINGERS IV SOLN
INTRAVENOUS | Status: DC
  Filled 2018-04-26: qty 1000

## 2018-04-26 MED ORDER — GABAPENTIN 300 MG PO CAPS
ORAL_CAPSULE | ORAL | Status: AC
  Filled 2018-04-26: qty 1

## 2018-04-26 MED ORDER — ACETAMINOPHEN 500 MG PO TABS
1000.0000 mg | ORAL_TABLET | ORAL | Status: AC
  Administered 2018-04-26: 1000 mg via ORAL
  Filled 2018-04-26: qty 2

## 2018-04-26 MED ORDER — ACETAMINOPHEN 325 MG PO TABS
325.0000 mg | ORAL_TABLET | ORAL | Status: DC | PRN
  Filled 2018-04-26: qty 2

## 2018-04-26 MED ORDER — PROPOFOL 10 MG/ML IV BOLUS
INTRAVENOUS | Status: DC | PRN
  Administered 2018-04-26: 50 mg via INTRAVENOUS
  Administered 2018-04-26: 150 mg via INTRAVENOUS

## 2018-04-26 MED ORDER — CELECOXIB 400 MG PO CAPS
400.0000 mg | ORAL_CAPSULE | ORAL | Status: AC
  Administered 2018-04-26: 400 mg via ORAL
  Filled 2018-04-26: qty 1

## 2018-04-26 MED ORDER — ACETAMINOPHEN 500 MG PO TABS
ORAL_TABLET | ORAL | Status: AC
  Filled 2018-04-26: qty 2

## 2018-04-26 MED ORDER — DEXAMETHASONE SODIUM PHOSPHATE 10 MG/ML IJ SOLN
INTRAMUSCULAR | Status: AC
  Filled 2018-04-26: qty 1

## 2018-04-26 MED ORDER — STERILE WATER FOR IRRIGATION IR SOLN
Status: DC | PRN
  Administered 2018-04-26: 3000 mL

## 2018-04-26 MED ORDER — DEXAMETHASONE SODIUM PHOSPHATE 10 MG/ML IJ SOLN
INTRAMUSCULAR | Status: DC | PRN
  Administered 2018-04-26: 4 mg via INTRAVENOUS

## 2018-04-26 MED ORDER — SOD CITRATE-CITRIC ACID 500-334 MG/5ML PO SOLN
ORAL | Status: AC
  Filled 2018-04-26: qty 15

## 2018-04-26 MED ORDER — CEFAZOLIN SODIUM-DEXTROSE 2-4 GM/100ML-% IV SOLN
INTRAVENOUS | Status: AC
  Filled 2018-04-26: qty 100

## 2018-04-26 MED ORDER — OXYCODONE HCL 5 MG/5ML PO SOLN
5.0000 mg | Freq: Once | ORAL | Status: AC | PRN
  Filled 2018-04-26: qty 5

## 2018-04-26 MED ORDER — MIDAZOLAM HCL 2 MG/2ML IJ SOLN
INTRAMUSCULAR | Status: AC
  Filled 2018-04-26: qty 2

## 2018-04-26 MED ORDER — GABAPENTIN 300 MG PO CAPS
300.0000 mg | ORAL_CAPSULE | ORAL | Status: AC
  Administered 2018-04-26: 300 mg via ORAL
  Filled 2018-04-26: qty 1

## 2018-04-26 MED ORDER — MEPERIDINE HCL 25 MG/ML IJ SOLN
6.2500 mg | INTRAMUSCULAR | Status: DC | PRN
  Filled 2018-04-26: qty 1

## 2018-04-26 SURGICAL SUPPLY — 28 items
ADH SKN CLS APL DERMABOND .7 (GAUZE/BANDAGES/DRESSINGS) ×1
AGENT HMST KT MTR STRL THRMB (HEMOSTASIS) ×1
BLADE SURG 15 STRL LF DISP TIS (BLADE) ×2 IMPLANT
BLADE SURG 15 STRL SS (BLADE) ×6
CANISTER SUCT 3000ML PPV (MISCELLANEOUS) ×3 IMPLANT
DERMABOND ADVANCED (GAUZE/BANDAGES/DRESSINGS) ×2
DERMABOND ADVANCED .7 DNX12 (GAUZE/BANDAGES/DRESSINGS) ×1 IMPLANT
GAUZE PACKING 2X5 YD STRL (GAUZE/BANDAGES/DRESSINGS) ×3 IMPLANT
GLOVE BIO SURGEON STRL SZ7 (GLOVE) ×3 IMPLANT
GLOVE BIOGEL PI IND STRL 7.0 (GLOVE) ×2 IMPLANT
GLOVE BIOGEL PI INDICATOR 7.0 (GLOVE) ×4
GOWN STRL REUS W/TWL LRG LVL3 (GOWN DISPOSABLE) ×9 IMPLANT
MARKER SKIN DUAL TIP RULER LAB (MISCELLANEOUS) ×2 IMPLANT
NEEDLE HYPO 22GX1.5 SAFETY (NEEDLE) ×3 IMPLANT
NS IRRIG 1000ML POUR BTL (IV SOLUTION) ×3 IMPLANT
PACK VAGINAL WOMENS (CUSTOM PROCEDURE TRAY) ×3 IMPLANT
PACKING VAGINAL (PACKING) ×2 IMPLANT
SET IRRIG Y TYPE TUR BLADDER L (SET/KITS/TRAYS/PACK) ×3 IMPLANT
SLING TRANS VAGINAL TAPE (Sling) ×2 IMPLANT
SLING UTERINE/ABD GYNECARE TVT (Sling) ×1 IMPLANT
SURGIFLO W/THROMBIN 8M KIT (HEMOSTASIS) ×2 IMPLANT
SUT VIC AB 0 CT1 18XCR BRD8 (SUTURE) ×1 IMPLANT
SUT VIC AB 0 CT1 8-18 (SUTURE) ×3
SUT VIC AB 2-0 CT1 27 (SUTURE) ×3
SUT VIC AB 2-0 CT1 TAPERPNT 27 (SUTURE) ×1 IMPLANT
SUT VIC AB 2-0 UR6 27 (SUTURE) ×2 IMPLANT
TOWEL OR 17X24 6PK STRL BLUE (TOWEL DISPOSABLE) ×6 IMPLANT
TRAY FOLEY W/BAG SLVR 14FR (SET/KITS/TRAYS/PACK) ×3 IMPLANT

## 2018-04-26 NOTE — Discharge Instructions (Signed)
No driving on narcotics, no sexual activity for 6 weeks.  D/w pt with foley and instruct on care; give leg bag and show how to change bags.  I d/w pt's husband how to take out the packing at bout 12 pm tomorrow- he is to tug on the end outside the vagina until removed entirely.  Pt is to call if bleeding is heavy like a period; expect some brownish, reddish or pinkish d/c.   Pt can start celebrex and Aspirin after pain controlled and no longer needs to use Ibuprofen for pain.    Post Anesthesia Home Care Instructions  Activity: Get plenty of rest for the remainder of the day. A responsible individual must stay with you for 24 hours following the procedure.  For the next 24 hours, DO NOT: -Drive a car -Paediatric nurse -Drink alcoholic beverages -Take any medication unless instructed by your physician -Make any legal decisions or sign important papers.  Meals: Start with liquid foods such as gelatin or soup. Progress to regular foods as tolerated. Avoid greasy, spicy, heavy foods. If nausea and/or vomiting occur, drink only clear liquids until the nausea and/or vomiting subsides. Call your physician if vomiting continues.  Special Instructions/Symptoms: Your throat may feel dry or sore from the anesthesia or the breathing tube placed in your throat during surgery. If this causes discomfort, gargle with warm salt water. The discomfort should disappear within 24 hours.  If you had a scopolamine patch placed behind your ear for the management of post- operative nausea and/or vomiting:  1. The medication in the patch is effective for 72 hours, after which it should be removed.  Wrap patch in a tissue and discard in the trash. Wash hands thoroughly with soap and water. 2. You may remove the patch earlier than 72 hours if you experience unpleasant side effects which may include dry mouth, dizziness or visual disturbances. 3. Avoid touching the patch. Wash your hands with soap and water  after contact with the patch.

## 2018-04-26 NOTE — Transfer of Care (Signed)
Immediate Anesthesia Transfer of Care Note  Patient: Tina [name]  Procedure(s) Performed: TRANSVAGINAL TAPE (TVT) PROCEDURE (N/A ) CYSTOSCOPY (N/A ) ANTERIOR REPAIR (CYSTOCELE) (N/A )  Patient Location: PACU  Anesthesia Type:General  Level of Consciousness: awake, alert  and oriented  Airway & Oxygen Therapy: Patient Spontanous Breathing and Patient connected to nasal cannula oxygen  Post-op Assessment: Report given to RN and Post -op Vital signs reviewed and stable  Post vital signs: Reviewed and stable  Last Vitals:  Vitals Value Taken Time  BP    Temp    Pulse    Resp    SpO2      Last Pain:  Vitals:   04/26/18 1005  TempSrc: Oral         Complications: No apparent anesthesia complications

## 2018-04-26 NOTE — Op Note (Signed)
04/26/2018  2:00 PM  PATIENT:  Tina [name]  67 y.o. female  PRE-OPERATIVE DIAGNOSIS:  SUI, cystocele  POST-OPERATIVE DIAGNOSIS:  SUI, cystocele  PROCEDURE:  Procedure(s): TRANSVAGINAL TAPE (TVT) PROCEDURE (N/A) CYSTOSCOPY (N/A) ANTERIOR REPAIR (CYSTOCELE) (N/A)  SURGEON:  Surgeon(s) and Role:    * Bobbye Charleston, MD - Primary    * Jerelyn Charles, MD - Assisting  ANESTHESIA:   general  EBL:  50 mL   LOCAL MEDICATIONS USED:  LIDOCAINE   SPECIMEN:  No Specimen  DISPOSITION OF SPECIMEN:  N/A  COUNTS:  YES  DICTATION: .Note written in EPIC  PLAN OF CARE: Discharge to home after PACU  PATIENT DISPOSITION:  PACU - hemodynamically stable.   Delay start of Pharmacological VTE agent (>24hrs) due to surgical blood loss or risk of bleeding: not applicable  Meds:  Surgilflo and estrace.  Findings: cystocele to -1.  Complications: small 2 cm hematoma on L mons; possible bruising from needle injury to R buttock.   Technique:  After general anesthesia was achieved, the patient was prepped and draped in a sterile fashion.  The Foley was placed at this point.The anterior vaginal mucosa was then entered into in the midline with the help of [name]'s after being injected with 0.5% lidocaine with epi with the scalpel. The vaginal mucosa was then reflected off of the vesicouterine fascia with careful blunt dissection until the pelvic floor could feel be felt underneath the pubic bone. 2 small stab incisions are made on 2 cm either side of the midline just above the pubic bone. The abdominal needles were placed carefully through the pelvic floor and out the vagina. On the R initially the needle went into the buttock and was replaced without signs of injury, The Foley was removed and the cystoscope placed inside the bladder; there were no needles in the bladder, there was good spillage of urine from the bilateral ureteral orifices and the trigone and urethra were clear.  The foley was  replaced. The abdominal needles were attached the vaginal needles and the tape pulled through and cut into place. A [name] was used to ensure that the there was no tension placed on the tape. Once I was satisfied that the sling was in the correct place and at no tension, the anterior repair was done with one mattress stitches of 0 Vicryl. Hemostasis was achieved surgiflowhich was also instilled into the abdominal stab incision sites to help with hemostasis. The vaginal mucosa was trimmed and closed with a running locked stitch of 2-0 Vicryl. A vaginal pack was placed inside the vagina with Estrace cream and the patient tolerated the procedure was returned to recovery in stable condition.   [name] A

## 2018-04-26 NOTE — Discharge Summary (Signed)
Physician Discharge Summary  Patient ID: Tina [name] MRN: [id number] DOB/AGE: [birth year] 67 y.o.  Admit date: 04/26/2018 Discharge date: 04/26/2018  Admission Diagnoses:SUI, cystocele  Discharge Diagnoses: same Active Problems:   * No active hospital problems. *   Discharged Condition: good  Hospital Course: uncomplicated TVT, A repair, cysto  Consults: None  Significant Diagnostic Studies: none  Treatments: surgery: uncomplicated TVT, A repair, cysto    Discharge Exam: Blood pressure (!) 123/55, pulse 68, temperature (!) 97.4 F (36.3 C), resp. rate 16, height 5\' 4"  (1.626 m), weight 66.5 kg, SpO2 95 %.  Disposition: Discharge disposition: 01-Home or Self Care       Discharge Instructions    Call MD for:  temperature >100.4   Complete by:  As directed    Diet - low sodium heart healthy   Complete by:  As directed    Discharge instructions   Complete by:  As directed    No driving on narcotics, no sexual activity for 6 weeks.  D/w pt with foley and instruct on care; give leg bag and show how to change bags.  I d/w pt's husband how to take out the packing at bout 12 pm tomorrow- he is to tug on the end outside the vagina until removed entirely.  Pt is to call if bleeding is heavy like a period; expect some brownish, reddish or pinkish d/c.   Pt can start celebrex and Aspirin after pain controlled and no longer needs to use Ibuprofen for pain.   Increase activity slowly   Complete by:  As directed    May shower / Bathe   Complete by:  As directed    Shower, no bath for 2 weeks.   Remove dressing in 24 hours   Complete by:  As directed    Sexual Activity Restrictions   Complete by:  As directed    No sexual activity for 2 weeks.     Allergies as of 04/26/2018      Reactions   Codeine Nausea And Vomiting, Nausea Only   Ilosone [erythromycin] Other (See Comments)   TACHYCARDIA   Simvastatin Other (See Comments)   LEG PAIN      Medication List    STOP taking these medications   aspirin 81 MG tablet   celecoxib 200 MG capsule Commonly known as:  CELEBREX     TAKE these medications   CENTRUM SILVER PO Take 1 tablet by mouth daily.   cetirizine 10 MG tablet Commonly known as:  ZYRTEC TAKE 1 TABLET (10 MG TOTAL) BY MOUTH DAILY.   COQ-10 PO Take 1 capsule by mouth daily.   DEXILANT 60 MG capsule Generic drug:  dexlansoprazole Take 60 mg by mouth daily.   FISH OIL PO Take 1 capsule by mouth daily.   ibuprofen 800 MG tablet Commonly known as:  ADVIL,MOTRIN Take 1 tablet (800 mg total) by mouth every 8 (eight) hours as needed.   metoprolol tartrate 50 MG tablet Commonly known as:  LOPRESSOR Take 1 tablet (50 mg total) by mouth 2 (two) times daily.   montelukast 10 MG tablet Commonly known as:  SINGULAIR Take 10 mg by mouth at bedtime.   oxyCODONE 5 MG immediate release tablet Commonly known as:  Oxy IR/ROXICODONE Take 1 tablet (5 mg total) by mouth every 6 (six) hours as needed for severe pain.   pravastatin 20 MG tablet Commonly known as:  PRAVACHOL Take 1 tablet (20 mg total) by mouth every evening.   QVAR  IN Inhale 1 puff into the lungs daily.   risedronate 35 MG tablet Commonly known as:  ACTONEL TAKE 1 TABLET BY MOUTH EVERY 7 DAYS WITH WATER ON EMPTY STOMACH, THEN NO FOOD, STAY UPRIGHT 30MINUTE      Follow-up Information    Bobbye Charleston, MD Follow up in 3 day(s).   Specialty:  Obstetrics and Gynecology Why:  Pt to go to office for voiding trial on Monday at 8 am. Contact information: [place] [phone]           Signed: Thersa Mohiuddin A 04/26/2018, 2:15 PM

## 2018-04-26 NOTE — Anesthesia Preprocedure Evaluation (Addendum)
Anesthesia Evaluation  Patient identified by MRN, date of birth, ID band Patient awake    Reviewed: Allergy & Precautions, NPO status , Patient's Chart, lab work & pertinent test results  History of Anesthesia Complications (+) PONV and history of anesthetic complications  Airway Mallampati: I  TM Distance: >3 FB Neck ROM: Full    Dental  (+) Teeth Intact, Dental Advisory Given   Pulmonary neg pulmonary ROS,    breath sounds clear to auscultation       Cardiovascular + dysrhythmias Supra Ventricular Tachycardia  Rhythm:Regular Rate:Normal  Echo 18 Left ventricle:  The cavity size was normal. Systolic function was normal. The estimated ejection fraction was in the range of 50% to 55%. Wall motion was normal; there were no regional wall motion abnormalities. Doppler parameters are consistent with abnormal left ventricular relaxation (grade 1 diastolic dysfunction).    Neuro/Psych PSYCHIATRIC DISORDERS Anxiety Depression negative neurological ROS     GI/Hepatic Neg liver ROS, GERD  ,  Endo/Other  negative endocrine ROSdiabetes  Renal/GU negative Renal ROS  negative genitourinary   Musculoskeletal  (+) Arthritis , Osteoarthritis,    Abdominal   Peds negative pediatric ROS (+)  Hematology negative hematology ROS (+)   Anesthesia Other Findings   Reproductive/Obstetrics negative OB ROS                           Anesthesia Physical  Anesthesia Plan  ASA: III  Anesthesia Plan: General   Post-op Pain Management:    Induction: Intravenous  PONV Risk Score and Plan: 3 and Treatment may vary due to age or medical condition, Dexamethasone and Ondansetron  Airway Management Planned: LMA and Oral ETT  Additional Equipment:   Intra-op Plan:   Post-operative Plan: Extubation in OR  Informed Consent: I have reviewed the patients History and Physical, chart, labs and discussed the  procedure including the risks, benefits and alternatives for the proposed anesthesia with the patient or authorized representative who has indicated his/her understanding and acceptance.   Dental advisory given  Plan Discussed with: CRNA, Anesthesiologist and Surgeon  Anesthesia Plan Comments: (  )       Anesthesia Quick Evaluation

## 2018-04-26 NOTE — Anesthesia Procedure Notes (Signed)
Procedure Name: LMA Insertion Performed by: Jonna Munro, CRNA Pre-anesthesia Checklist: Patient identified, Emergency Drugs available, Suction available, Patient being monitored and Timeout performed Patient Re-evaluated:Patient Re-evaluated prior to induction Oxygen Delivery Method: Circle system utilized Preoxygenation: Pre-oxygenation with 100% oxygen Induction Type: IV induction LMA: LMA inserted LMA Size: 3.0 Number of attempts: 1 Placement Confirmation: positive ETCO2 and breath sounds checked- equal and bilateral Dental Injury: Teeth and Oropharynx as per pre-operative assessment

## 2018-04-26 NOTE — Brief Op Note (Signed)
04/26/2018  2:00 PM  PATIENT:  [name]  67 y.o. female  PRE-OPERATIVE DIAGNOSIS:  SUI, cystocele  POST-OPERATIVE DIAGNOSIS:  SUI, cystocele  PROCEDURE:  Procedure(s): TRANSVAGINAL TAPE (TVT) PROCEDURE (N/A) CYSTOSCOPY (N/A) ANTERIOR REPAIR (CYSTOCELE) (N/A)  SURGEON:  Surgeon(s) and Role:    * Bobbye Charleston, MD - Primary    * Jerelyn Charles, MD - Assisting  ANESTHESIA:   general  EBL:  50 mL   LOCAL MEDICATIONS USED:  LIDOCAINE   SPECIMEN:  No Specimen  DISPOSITION OF SPECIMEN:  N/A  COUNTS:  YES  TOURNIQUET:  * No tourniquets in log *  DICTATION: .Note written in EPIC  PLAN OF CARE: Discharge to home after PACU  PATIENT DISPOSITION:  PACU - hemodynamically stable.   Delay start of Pharmacological VTE agent (>24hrs) due to surgical blood loss or risk of bleeding: not applicable

## 2018-04-28 NOTE — Anesthesia Postprocedure Evaluation (Signed)
Anesthesia Post Note  Patient: Tina [name]  Procedure(s) Performed: TRANSVAGINAL TAPE (TVT) PROCEDURE (N/A ) CYSTOSCOPY (N/A ) ANTERIOR REPAIR (CYSTOCELE) (N/A )     Patient location during evaluation: PACU Anesthesia Type: General Level of consciousness: awake and alert Pain management: pain level controlled Vital Signs Assessment: post-procedure vital signs reviewed and stable Respiratory status: spontaneous breathing, nonlabored ventilation, respiratory function stable and patient connected to nasal cannula oxygen Cardiovascular status: blood pressure returned to baseline and stable Postop Assessment: no apparent nausea or vomiting Anesthetic complications: no    Last Vitals:  Vitals:   04/26/18 1645 04/26/18 1804  BP: 118/68 115/62  Pulse: 74 73  Resp: 14 14  Temp:  36.7 C  SpO2: 98% 97%    Last Pain:  Vitals:   04/26/18 1804  TempSrc:   PainSc: 3                  Fannye Myer

## 2018-04-29 ENCOUNTER — Encounter (HOSPITAL_BASED_OUTPATIENT_CLINIC_OR_DEPARTMENT_OTHER): Payer: Self-pay | Admitting: Obstetrics and Gynecology

## 2018-06-10 ENCOUNTER — Other Ambulatory Visit: Payer: Self-pay

## 2018-06-10 MED ORDER — PRAVASTATIN SODIUM 20 MG PO TABS: 20.0000 mg | ORAL_TABLET | Freq: Every evening | ORAL | 1 refills | Status: DC

## 2018-08-01 ENCOUNTER — Ambulatory Visit: Payer: Medicare Other | Admitting: Cardiology

## 2018-08-05 ENCOUNTER — Ambulatory Visit: Payer: Medicare Other | Admitting: Cardiology

## 2018-08-21 ENCOUNTER — Ambulatory Visit: Payer: Medicare Other | Admitting: Cardiology

## 2018-08-21 ENCOUNTER — Encounter: Payer: Self-pay | Admitting: Cardiology

## 2018-08-21 VITALS — BP 120/80 | HR 56 | Ht 64.5 in | Wt 150.1 lb

## 2018-08-21 DIAGNOSIS — R091 Pleurisy: Secondary | ICD-10-CM

## 2018-08-21 DIAGNOSIS — I471 Supraventricular tachycardia: Secondary | ICD-10-CM

## 2018-08-21 DIAGNOSIS — R55 Syncope and collapse: Secondary | ICD-10-CM | POA: Insufficient documentation

## 2018-08-21 HISTORY — DX: Syncope and collapse: R55

## 2018-08-21 NOTE — Progress Notes (Signed)
Cardiology Office Note:    Date:  08/21/2018   ID:  Tina [name], DOB 1951/07/13, MRN [id number]  PCP:  Serita Grammes, MD  Cardiologist:  Shirlee More, MD    Referring MD: Serita Grammes, MD    ASSESSMENT:    1. SVT (supraventricular tachycardia) ([place])   2. Pleuritis   3. Syncope, unspecified syncope type    PLAN:    In order of problems listed above:  1. Stable continue beta-blocker pending results of 30-day ambulatory heart rhythm monitor 2. Stable no recurrence 3. She had a recent episode of fairly profound syncope when she was receiving multiple immunizations at a clinic unfortunately she was not monitored she is not aware of her heart rate her blood pressure was checked and the episode was associated some ictal activity.  She relates she has had a normal EEG I suspect what she had was a prolonged episode of hypotension and/or bradycardia and secondary seizure we will do a 3-day monitor to quickly assess her heart rhythm before she travels to [place] if she has recurrence and implanted loop recorder would be appropriate   Next appointment: 8 weeks   Medication Adjustments/Labs and Tests Ordered: Current medicines are reviewed at length with the patient today.  Concerns regarding medicines are outlined above.  Orders Placed This Encounter  Procedures  . LONG TERM MONITOR (3-14 DAYS)  . EKG 12-Lead   No orders of the defined types were placed in this encounter.   Chief Complaint  Patient presents with  . Follow-up    for SVT    History of Present Illness:    Tina [name] is a 68 y.o. female with a hx of supraventricular tachycardia and pleuritic chest pain with pleuritis  last seen 03/07/18. Compliance with diet, lifestyle and medications: Yes  Skin ray travel to [place] she is a little apprehensive.  Several things have occurred warmish is found to have a thyroid nodule and will be undergoing further evaluation.  The second is that she had a surgical  procedure and afterwards it took a long time for anesthesia to wear off but there were no peri-operative complications and was discharged from same-day surgery.  The third episode was when she was having multiple immunizations she fainted and had a fairly dramatic prolonged syncopal episode with some ictal activity.  Unfortunately she did not go to emergency room she was monitored she is not aware of her heart rate and blood pressure was checked she was discharged home and had a subsequent EEG she tells me is normal.  She is having palpitation where she feels as if her heart races briefly but has not had other syncopal episodes.  For further evaluation we will apply a 3-day ZIO monitor before she travels to [place].  If she has recurrent she need an implanted loop recorder Past Medical History:  Diagnosis Date  . Abnormal mammogram of right breast 10/2016  . Aortic atherosclerosis ([place])   . Arthritis    right Thumb  . Chest pain in adult    Normal MPS 2016  . Complication of anesthesia    prolonged sedation  . Dental crowns present   . Dupuytren contracture    Bilateral hands, mild  . GERD (gastroesophageal reflux disease)   . Gestational diabetes    history of  . Grade I diastolic dysfunction 57/26/2035   on ECHO  . Hepatitis    age 39 "infectious"  . History of blood transfusion 1982  . History of cardiomegaly  04/14/2017   Noted on CXR  . History of hepatitis as a child    "infectious", per pt.  . History of hiatal hernia   . History of pleurisy   . History of pneumonia   . Hyperlipidemia   . Irregular heartbeat    since age 52, per pt.  . Limited joint range of motion    cervical spine - difficulty looking up for extended period  . MR (mitral regurgitation) 05/09/2017   Mild, noted on ECHO  . MVP (mitral valve prolapse)    Mild  . Orthostatic hypotension   . Osteoporosis   . PONV (postoperative nausea and vomiting)   . Pulmonary nodule 04/18/2017   5 mm left lower lobe  nodule   . Seasonal allergies   . SUI (stress urinary incontinence, female)   . SVT (supraventricular tachycardia) ([place])   . TR (tricuspid regurgitation) 05/09/2017   Mild, noted on ECHO  . Wears contact lenses   . Wears glasses     Past Surgical History:  Procedure Laterality Date  . ANTERIOR CERVICAL DECOMP/DISCECTOMY FUSION  02/03/2002   C5-6  . BLADDER SUSPENSION N/A 04/26/2018   Procedure: TRANSVAGINAL TAPE (TVT) PROCEDURE;  Surgeon: Bobbye Charleston, MD;  Location: [place];  Service: Gynecology;  Laterality: N/A;  . CESAREAN SECTION     x 3  . COLONOSCOPY  2013   several  . CYSTOCELE REPAIR N/A 04/26/2018   Procedure: ANTERIOR REPAIR (CYSTOCELE);  Surgeon: Bobbye Charleston, MD;  Location: [facility];  Service: Gynecology;  Laterality: N/A;  . CYSTOSCOPY N/A 04/26/2018   Procedure: CYSTOSCOPY;  Surgeon: Bobbye Charleston, MD;  Location: [facility];  Service: Gynecology;  Laterality: N/A;  . INGUINAL HERNIA REPAIR Right   . LAPAROSCOPIC NISSEN FUNDOPLICATION  82/99/3716  . NISSEN FUNDOPLICATION  96/78/9381   redo  . RADIOACTIVE SEED GUIDED EXCISIONAL BREAST BIOPSY Right 11/08/2016   Procedure: RADIOACTIVE SEED GUIDED EXCISIONAL RIGHT BREAST BIOPSY;  Surgeon: Rolm Bookbinder, MD;  Location: [place];  Service: General;  Laterality: Right;  . TONSILLECTOMY  1977  . TUBAL LIGATION    . UPPER GI ENDOSCOPY     x2  . WISDOM TOOTH EXTRACTION      Current Medications: Current Meds  Medication Sig  . acetaminophen (TYLENOL) 325 MG tablet Take 325 mg by mouth as needed.  . Beclomethasone Dipropionate (QVAR IN) Inhale 1 puff into the lungs daily.  . cetirizine (ZYRTEC) 10 MG tablet TAKE 1 TABLET (10 MG TOTAL) BY MOUTH DAILY.  [name] Coenzyme Q10 (COQ-10 PO) Take 1 capsule by mouth daily.   [name] dexlansoprazole (DEXILANT) 60 MG capsule Take 60 mg by mouth daily.  . metoprolol tartrate (LOPRESSOR) 50 MG tablet Take 1  tablet (50 mg total) by mouth 2 (two) times daily.  . montelukast (SINGULAIR) 10 MG tablet Take 10 mg by mouth at bedtime.  . Multiple Vitamins-Minerals (CENTRUM SILVER PO) Take 1 tablet by mouth daily.   . Omega-3 Fatty Acids (FISH OIL PO) Take 1 capsule by mouth daily.   . pravastatin (PRAVACHOL) 20 MG tablet Take 1 tablet (20 mg total) by mouth every evening.  . risedronate (ACTONEL) 35 MG tablet TAKE 1 TABLET BY MOUTH EVERY 7 DAYS WITH WATER ON EMPTY STOMACH, THEN NO FOOD, STAY UPRIGHT 30MINUTE     Allergies:   Codeine; Ilosone [erythromycin]; and Simvastatin   Social History   Socioeconomic History  . Marital status: Married    Spouse name: Not  on file  . Number of children: Not on file  . Years of education: Not on file  . Highest education level: Not on file  Occupational History  . Not on file  Social Needs  . Financial resource strain: Not on file  . Food insecurity:    Worry: Not on file    Inability: Not on file  . Transportation needs:    Medical: Not on file    Non-medical: Not on file  Tobacco Use  . Smoking status: Never Smoker  . Smokeless tobacco: Never Used  Substance and Sexual Activity  . Alcohol use: No  . Drug use: No  . Sexual activity: Not on file  Lifestyle  . Physical activity:    Days per week: Not on file    Minutes per session: Not on file  . Stress: Not on file  Relationships  . Social connections:    Talks on phone: Not on file    Gets together: Not on file    Attends religious service: Not on file    Active member of club or organization: Not on file    Attends meetings of clubs or organizations: Not on file    Relationship status: Not on file  Other Topics Concern  . Not on file  Social History Narrative   Retired high school business teacher   Master's degree educatation   Married, lives with spouse -     Family History: The patient's yes family history includes Alzheimer's disease in her father and mother; Congestive Heart  Failure in her father; Heart attack in her father, maternal grandfather, maternal grandmother, paternal grandfather, and paternal grandmother; Heart disease in her father; Heart failure in her father; Hyperlipidemia in her father; Prostate cancer in her father; Stroke in her paternal grandmother. ROS:   Please see the history of present illness.    All other systems reviewed and are negative.  EKGs/Labs/Other Studies Reviewed:    The following studies were reviewed today:  EKG:  EKG ordered today.  The ekg ordered today demonstrates sinus rhythm and is normal  Recent Labs: 04/26/2018: Hemoglobin 13.3; Platelets 243; Potassium 4.1; Sodium 141  Recent Lipid Panel    Component Value Date/Time   CHOL 192 04/08/2018 0945   TRIG 152 (H) 04/08/2018 0945   HDL 46 04/08/2018 0945   CHOLHDL 4.2 04/08/2018 0945   CHOLHDL 5 03/04/2015 0824   VLDL 20.8 03/04/2015 0824   LDLCALC 116 (H) 04/08/2018 0945    Physical Exam:    VS:  BP 120/80 (BP Location: Left Arm, Patient Position: Sitting, Cuff Size: Normal)   Pulse (!) 56   Ht 5' 4.5" (1.638 m)   Wt 150 lb 2 oz (68.1 kg)   SpO2 96%   BMI 25.37 kg/m     Wt Readings from Last 3 Encounters:  08/21/18 150 lb 2 oz (68.1 kg)  04/26/18 146 lb 11.2 oz (66.5 kg)  03/07/18 144 lb 3.2 oz (65.4 kg)     GEN:  Well nourished, well developed in no acute distress HEENT: Normal NECK: No JVD; No carotid bruits LYMPHATICS: No lymphadenopathy CARDIAC: RRR, no murmurs, rubs, gallops RESPIRATORY:  Clear to auscultation without rales, wheezing or rhonchi  ABDOMEN: Soft, non-tender, non-distended MUSCULOSKELETAL:  No edema; No deformity  SKIN: Warm and dry NEUROLOGIC:  Alert and oriented x 3 PSYCHIATRIC:  Normal affect    Signed, Shirlee More, MD  08/21/2018 4:50 PM    [facility]

## 2018-08-21 NOTE — Patient Instructions (Signed)
Medication Instructions:  Your physician recommends that you continue on your current medications as directed. Please refer to the Current Medication list given to you today.  If you need a refill on your cardiac medications before your next appointment, please call your pharmacy.   Lab work: NONE If you have labs (blood work) drawn today and your tests are completely normal, you will receive your results only by: [name] MyChart Message (if you have MyChart) OR . A paper copy in the mail If you have any lab test that is abnormal or we need to change your treatment, we will call you to review the results.  Testing/Procedures: You had an EKG today  Your physician has recommended that you wear a ZIO Patch.  A ZIO patch is a medical device that records the heart's electrical activity. Doctors most often use these monitors to diagnose arrhythmias. Arrhythmias are problems with the speed or rhythm of the heartbeat. The monitor is a small, portable device. You can wear one while you do your normal daily activities. This is usually used to diagnose what is causing palpitations/syncope (passing out).  3 day ZIO Patch     Follow-Up: At [facility], you and your health needs are our priority.  As part of our continuing mission to provide you with exceptional heart care, we have created designated Provider Care Teams.  These Care Teams include your primary Cardiologist (physician) and Advanced Practice Providers (APPs -  Physician Assistants and Nurse Practitioners) who all work together to provide you with the care you need, when you need it. You will need a follow up appointment in 6 weeks.

## 2018-08-23 ENCOUNTER — Ambulatory Visit (INDEPENDENT_AMBULATORY_CARE_PROVIDER_SITE_OTHER): Payer: Medicare Other

## 2018-08-23 DIAGNOSIS — R55 Syncope and collapse: Secondary | ICD-10-CM | POA: Diagnosis not present

## 2018-09-04 ENCOUNTER — Ambulatory Visit: Payer: Medicare Other | Admitting: Cardiology

## 2018-09-04 DIAGNOSIS — I480 Paroxysmal atrial fibrillation: Secondary | ICD-10-CM

## 2018-09-04 HISTORY — DX: Paroxysmal atrial fibrillation: I48.0

## 2018-09-04 MED ORDER — FLECAINIDE ACETATE 50 MG PO TABS: 50.0000 mg | ORAL_TABLET | Freq: Two times a day (BID) | ORAL | 3 refills | Status: DC

## 2018-09-04 MED ORDER — APIXABAN 5 MG PO TABS: 5.0000 mg | ORAL_TABLET | Freq: Two times a day (BID) | ORAL | 3 refills | Status: DC

## 2018-09-04 MED ORDER — METOPROLOL TARTRATE 25 MG PO TABS: 50.0000 mg | ORAL_TABLET | Freq: Two times a day (BID) | ORAL | 3 refills | Status: DC

## 2018-09-04 NOTE — Patient Instructions (Addendum)
Medication Instructions:  Your physician has recommended you make the following change in your medication:  START taking Eliquis 5 mg (1 tablet) two times per day START taking Flecainide 50mg  (1 tablet) two times per day DECREASE metoprolol dose from 50 mg to 25 mg two times per day STOP taking Aspirin   If you need a refill on your cardiac medications before your next appointment, please call your pharmacy.   Lab work: NONE  Testing/Procedures: An EKG will be performed on Monday, Feb. 24th, 2020.  Follow-Up: At [facility], you and your health needs are our priority.  As part of our continuing mission to provide you with exceptional heart care, we have created designated Provider Care Teams.  These Care Teams include your primary Cardiologist (physician) and Advanced Practice Providers (APPs -  Physician Assistants and Nurse Practitioners) who all work together to provide you with the care you need, when you need it. You will need a follow up appointment in 5 days.    Any Other Special Instructions Will Be Listed Below (If Applicable).  [email or web address] [email or web address]        FDA-cleared, clinical grade mobile EKG monitor: Tina [name] is the most clinically-validated mobile EKG used by the world's leading cardiac care medical professionals With Basic service, know instantly if your heart rhythm is normal or if atrial fibrillation is detected, and email the last single EKG recording to yourself or your doctor Premium service, available for purchase through the Kardia app for $9.99 per month or $99 per year, includes unlimited history and storage of your EKG recordings, a monthly EKG summary report to share with your doctor, along with the ability to track your blood pressure, activity and weight Includes one KardiaMobile phone clip FREE SHIPPING: Standard delivery 1-3 business days. Orders placed by 11:00am PST will ship that afternoon. Otherwise, will ship  next business day. All orders ship via [employer] from [place], [place]   Flecainide tablets What is this medicine? FLECAINIDE (FLEK a nide) is an antiarrhythmic drug. This medicine is used to prevent irregular heart rhythm. It can also slow down fast heartbeats called tachycardia. This medicine may be used for other purposes; ask your health care provider or pharmacist if you have questions. COMMON BRAND NAME(S): Tambocor What should I tell my health care provider before I take this medicine? They need to know if you have any of these conditions: -abnormal levels of potassium in the blood -heart disease including heart rhythm and heart rate problems -kidney or liver disease -recent heart attack -an unusual or allergic reaction to flecainide, local anesthetics, other medicines, foods, dyes, or preservatives -pregnant or trying to get pregnant -breast-feeding How should I use this medicine? Take this medicine by mouth with a glass of water. Follow the directions on the prescription label. You can take this medicine with or without food. Take your doses at regular intervals. Do not take your medicine more often than directed. Do not stop taking this medicine suddenly. This may cause serious, heart-related side effects. If your doctor wants you to stop the medicine, the dose may be slowly lowered over time to avoid any side effects. Talk to your pediatrician regarding the use of this medicine in children. While this drug may be prescribed for children as young as 1 year of age for selected conditions, precautions do apply. Overdosage: If you think you have taken too much of this medicine contact a poison control center or emergency room at once. NOTE: This medicine is only for  you. Do not share this medicine with others. What if I miss a dose? If you miss a dose, take it as soon as you can. If it is almost time for your next dose, take only that dose. Do not take double or extra doses. What  may interact with this medicine? Do not take this medicine with any of the following medications: -amoxapine -arsenic trioxide -certain antibiotics like clarithromycin, erythromycin, gatifloxacin, gemifloxacin, levofloxacin, moxifloxacin, sparfloxacin, or troleandomycin -certain antidepressants called tricyclic antidepressants like amitriptyline, imipramine, or nortriptyline -certain medicines to control heart rhythm like disopyramide, dofetilide, encainide, moricizine, procainamide, propafenone, and quinidine -cisapride -cyclobenzaprine -delavirdine -droperidol -haloperidol -hawthorn -imatinib -levomethadyl -maprotiline -medicines for malaria like chloroquine and halofantrine -pentamidine -phenothiazines like chlorpromazine, mesoridazine, prochlorperazine, thioridazine -pimozide -quinine -ranolazine -ritonavir -sertindole -ziprasidone This medicine may also interact with the following medications: -cimetidine -medicines for angina or high blood pressure -medicines to control heart rhythm like amiodarone and digoxin This list may not describe all possible interactions. Give your health care provider a list of all the medicines, herbs, non-prescription drugs, or dietary supplements you use. Also tell them if you smoke, drink alcohol, or use illegal drugs. Some items may interact with your medicine. What should I watch for while using this medicine? Visit your doctor or health care professional for regular checks on your progress. Because your condition and the use of this medicine carries some risk, it is a good idea to carry an identification card, necklace or bracelet with details of your condition, medications and doctor or health care professional. Check your blood pressure and pulse rate regularly. Ask your health care professional what your blood pressure and pulse rate should be, and when you should contact him or her. Your doctor or health care professional also may schedule  regular blood tests and electrocardiograms to check your progress. You may get drowsy or dizzy. Do not drive, use machinery, or do anything that needs mental alertness until you know how this medicine affects you. Do not stand or sit up quickly, especially if you are an older patient. This reduces the risk of dizzy or fainting spells. Alcohol can make you more dizzy, increase flushing and rapid heartbeats. Avoid alcoholic drinks. What side effects may I notice from receiving this medicine? Side effects that you should report to your doctor or health care professional as soon as possible: -chest pain, continued irregular heartbeats -difficulty breathing -swelling of the legs or feet -trembling, shaking -unusually weak or tired Side effects that usually do not require medical attention (report to your doctor or health care professional if they continue or are bothersome): -blurred vision -constipation -headache -nausea, vomiting -stomach pain This list may not describe all possible side effects. Call your doctor for medical advice about side effects. You may report side effects to FDA at 1-800-FDA-1088. Where should I keep my medicine? Keep out of the reach of children. Store at room temperature between 15 and 30 degrees C (59 and 86 degrees F). Protect from light. Keep container tightly closed. Throw away any unused medicine after the expiration date. NOTE: This sheet is a summary. It may not cover all possible information. If you have questions about this medicine, talk to your doctor, pharmacist, or health care provider.  2019 Elsevier/Gold Standard (2007-11-06 16:46:09)   Apixaban oral tablets What is this medicine? APIXABAN (a PIX a ban) is an anticoagulant (blood thinner). It is used to lower the chance of stroke in people with a medical condition called atrial fibrillation. It is also used  to treat or prevent blood clots in the lungs or in the veins. This medicine may be used for  other purposes; ask your health care provider or pharmacist if you have questions. COMMON BRAND NAME(S): Eliquis What should I tell my health care provider before I take this medicine? They need to know if you have any of these conditions: -bleeding disorders -bleeding in the brain -blood in your stools (black or tarry stools) or if you have blood in your vomit -history of stomach bleeding -kidney disease -liver disease -mechanical heart valve -an unusual or allergic reaction to apixaban, other medicines, foods, dyes, or preservatives -pregnant or trying to get pregnant -breast-feeding How should I use this medicine? Take this medicine by mouth with a glass of water. Follow the directions on the prescription label. You can take it with or without food. If it upsets your stomach, take it with food. Take your medicine at regular intervals. Do not take it more often than directed. Do not stop taking except on your doctor's advice. Stopping this medicine may increase your risk of a blood clot. Be sure to refill your prescription before you run out of medicine. Talk to your pediatrician regarding the use of this medicine in children. Special care may be needed. Overdosage: If you think you have taken too much of this medicine contact a poison control center or emergency room at once. NOTE: This medicine is only for you. Do not share this medicine with others. What if I miss a dose? If you miss a dose, take it as soon as you can. If it is almost time for your next dose, take only that dose. Do not take double or extra doses. What may interact with this medicine? This medicine may interact with the following: -aspirin and aspirin-like medicines -certain medicines for fungal infections like ketoconazole and itraconazole -certain medicines for seizures like carbamazepine and phenytoin -certain medicines that treat or prevent blood clots like warfarin, enoxaparin, and  dalteparin -clarithromycin -NSAIDs, medicines for pain and inflammation, like ibuprofen or naproxen -rifampin -ritonavir -St. John's wort This list may not describe all possible interactions. Give your health care provider a list of all the medicines, herbs, non-prescription drugs, or dietary supplements you use. Also tell them if you smoke, drink alcohol, or use illegal drugs. Some items may interact with your medicine. What should I watch for while using this medicine? Visit your healthcare professional for regular checks on your progress. You may need blood work done while you are taking this medicine. Your condition will be monitored carefully while you are receiving this medicine. It is important not to miss any appointments. Avoid sports and activities that might cause injury while you are using this medicine. Severe falls or injuries can cause unseen bleeding. Be careful when using sharp tools or knives. Consider using an Copy. Take special care brushing or flossing your teeth. Report any injuries, bruising, or [name] spots on the skin to your healthcare professional. If you are going to need surgery or other procedure, tell your healthcare professional that you are taking this medicine. Wear a medical ID bracelet or chain. Carry a card that describes your disease and details of your medicine and dosage times. What side effects may I notice from receiving this medicine? Side effects that you should report to your doctor or health care professional as soon as possible: -allergic reactions like skin rash, itching or hives, swelling of the face, lips, or tongue -signs and symptoms of bleeding such as  bloody or black, tarry stools; [name] or dark-brown urine; spitting up blood or brown material that looks like coffee grounds; [name] spots on the skin; unusual bruising or bleeding from the eye, gums, or nose -signs and symptoms of a blood clot such as chest pain; shortness of breath; pain,  swelling, or warmth in the leg -signs and symptoms of a stroke such as changes in vision; confusion; trouble speaking or understanding; severe headaches; sudden numbness or weakness of the face, arm or leg; trouble walking; dizziness; loss of coordination This list may not describe all possible side effects. Call your doctor for medical advice about side effects. You may report side effects to FDA at 1-800-FDA-1088. Where should I keep my medicine? Keep out of the reach of children. Store at room temperature between 20 and 25 degrees C (68 and 77 degrees F). Throw away any unused medicine after the expiration date. NOTE: This sheet is a summary. It may not cover all possible information. If you have questions about this medicine, talk to your doctor, pharmacist, or health care provider.  2019 Elsevier/Gold Standard (2017-06-28 11:20:07)

## 2018-09-04 NOTE — Progress Notes (Signed)
Cardiology Office Note:    Date:  09/04/2018   ID:  [name], DOB [birth year], MRN [id number]  PCP:  Serita Grammes, MD  Cardiologist:  Shirlee More, MD    Referring MD: Serita Grammes, MD    ASSESSMENT:    1. PAF (paroxysmal atrial fibrillation) (HCC)    PLAN:    In order of problems listed above:  1. Her event monitor showed 4% about 3 hours of atrial fibrillation she was symptomatic.  We will start her on antiarrhythmic drug flecainide 50 mg twice daily decrease her beta-blocker start anticoagulant I will see her quickly Monday morning before she travels to [place].  I asked her to buy the iPhone adapter so she can monitor her heart rhythm while traveling she will stop aspirin.   Next appointment: Monday I will see her afterwards in 4 to 6 weeks   Medication Adjustments/Labs and Tests Ordered: Current medicines are reviewed at length with the patient today.  Concerns regarding medicines are outlined above.  No orders of the defined types were placed in this encounter.  Meds ordered this encounter  Medications  . metoprolol tartrate (LOPRESSOR) 25 MG tablet    Sig: Take 2 tablets (50 mg total) by mouth 2 (two) times daily.    Dispense:  60 tablet    Refill:  3  . apixaban (ELIQUIS) 5 MG TABS tablet    Sig: Take 1 tablet (5 mg total) by mouth 2 (two) times daily.    Dispense:  60 tablet    Refill:  3  . flecainide (TAMBOCOR) 50 MG tablet    Sig: Take 1 tablet (50 mg total) by mouth 2 (two) times daily.    Dispense:  60 tablet    Refill:  3    No chief complaint on file.   History of Present Illness:    Tina [name] is a 68 y.o. female with a hx of PAF CHADS2 vasc score of 2  on a recent Zio monitor last seen 08/21/18 after a syncopal episode. Compliance with diet, lifestyle and medications: yes  She wore a 3-day ZIO and she had about 4% atrial fibrillation was aware of it.  She has high healthcare literacy we discussed the management of it either  referral for EP ablation or antiarrhythmic drug she chooses to start antiarrhythmic drug and the benefits of anticoagulation she will stop aspirin and start Eliquis.  She is traveling to [place] and I will bring her the office Monday to check her before this trip Past Medical History:  Diagnosis Date  . Abnormal mammogram of right breast 10/2016  . Aortic atherosclerosis ([place])   . Arthritis    right Thumb  . Chest pain in adult    Normal MPS 2016  . Complication of anesthesia    prolonged sedation  . Dental crowns present   . Dupuytren contracture    Bilateral hands, mild  . GERD (gastroesophageal reflux disease)   . Gestational diabetes    history of  . Grade I diastolic dysfunction 05/10/8526   on ECHO  . Hepatitis    age 19 "infectious"  . History of blood transfusion 1982  . History of cardiomegaly 04/14/2017   Noted on CXR  . History of hepatitis as a child    "infectious", per pt.  . History of hiatal hernia   . History of pleurisy   . History of pneumonia   . Hyperlipidemia   . Irregular heartbeat    since age 86,  per pt.  . Limited joint range of motion    cervical spine - difficulty looking up for extended period  . MR (mitral regurgitation) 05/09/2017   Mild, noted on ECHO  . MVP (mitral valve prolapse)    Mild  . Orthostatic hypotension   . Osteoporosis   . PONV (postoperative nausea and vomiting)   . Pulmonary nodule 04/18/2017   5 mm left lower lobe nodule   . Seasonal allergies   . SUI (stress urinary incontinence, female)   . SVT (supraventricular tachycardia) ([place])   . TR (tricuspid regurgitation) 05/09/2017   Mild, noted on ECHO  . Wears contact lenses   . Wears glasses     Past Surgical History:  Procedure Laterality Date  . ANTERIOR CERVICAL DECOMP/DISCECTOMY FUSION  02/03/2002   C5-6  . BLADDER SUSPENSION N/A 04/26/2018   Procedure: TRANSVAGINAL TAPE (TVT) PROCEDURE;  Surgeon: Bobbye Charleston, MD;  Location: [place];   Service: Gynecology;  Laterality: N/A;  . CESAREAN SECTION     x 3  . COLONOSCOPY  2013   several  . CYSTOCELE REPAIR N/A 04/26/2018   Procedure: ANTERIOR REPAIR (CYSTOCELE);  Surgeon: Bobbye Charleston, MD;  Location: [facility];  Service: Gynecology;  Laterality: N/A;  . CYSTOSCOPY N/A 04/26/2018   Procedure: CYSTOSCOPY;  Surgeon: Bobbye Charleston, MD;  Location: [facility];  Service: Gynecology;  Laterality: N/A;  . INGUINAL HERNIA REPAIR Right   . LAPAROSCOPIC NISSEN FUNDOPLICATION  39/09/90  . NISSEN FUNDOPLICATION  33/00/7622   redo  . RADIOACTIVE SEED GUIDED EXCISIONAL BREAST BIOPSY Right 11/08/2016   Procedure: RADIOACTIVE SEED GUIDED EXCISIONAL RIGHT BREAST BIOPSY;  Surgeon: Rolm Bookbinder, MD;  Location: [place];  Service: General;  Laterality: Right;  . TONSILLECTOMY  1977  . TUBAL LIGATION    . UPPER GI ENDOSCOPY     x2  . WISDOM TOOTH EXTRACTION      Current Medications: Current Meds  Medication Sig  . acetaminophen (TYLENOL) 325 MG tablet Take 325 mg by mouth as needed.  . Beclomethasone Dipropionate (QVAR IN) Inhale 1 puff into the lungs daily.  . cetirizine (ZYRTEC) 10 MG tablet TAKE 1 TABLET (10 MG TOTAL) BY MOUTH DAILY.  [name] Coenzyme Q10 (COQ-10 PO) Take 1 capsule by mouth daily.   [name] dexlansoprazole (DEXILANT) 60 MG capsule Take 60 mg by mouth daily.  . metoprolol tartrate (LOPRESSOR) 25 MG tablet Take 2 tablets (50 mg total) by mouth 2 (two) times daily.  . montelukast (SINGULAIR) 10 MG tablet Take 10 mg by mouth at bedtime.  . Multiple Vitamins-Minerals (CENTRUM SILVER PO) Take 1 tablet by mouth daily.   . Omega-3 Fatty Acids (FISH OIL PO) Take 1 capsule by mouth daily.   . pravastatin (PRAVACHOL) 20 MG tablet Take 1 tablet (20 mg total) by mouth every evening.  . risedronate (ACTONEL) 35 MG tablet TAKE 1 TABLET BY MOUTH EVERY 7 DAYS WITH WATER ON EMPTY STOMACH, THEN NO FOOD, STAY UPRIGHT 30MINUTE  .  [DISCONTINUED] metoprolol tartrate (LOPRESSOR) 50 MG tablet Take 1 tablet (50 mg total) by mouth 2 (two) times daily.     Allergies:   Codeine; Erythromycin; and Simvastatin   Social History   Socioeconomic History  . Marital status: Married    Spouse name: Not on file  . Number of children: Not on file  . Years of education: Not on file  . Highest education level: Not on file  Occupational History  . Not on  file  Social Needs  . Financial resource strain: Not on file  . Food insecurity:    Worry: Not on file    Inability: Not on file  . Transportation needs:    Medical: Not on file    Non-medical: Not on file  Tobacco Use  . Smoking status: Never Smoker  . Smokeless tobacco: Never Used  Substance and Sexual Activity  . Alcohol use: No  . Drug use: No  . Sexual activity: Not on file  Lifestyle  . Physical activity:    Days per week: Not on file    Minutes per session: Not on file  . Stress: Not on file  Relationships  . Social connections:    Talks on phone: Not on file    Gets together: Not on file    Attends religious service: Not on file    Active member of club or organization: Not on file    Attends meetings of clubs or organizations: Not on file    Relationship status: Not on file  Other Topics Concern  . Not on file  Social History Narrative   Retired high school business teacher   Master's degree educatation   Married, lives with spouse -     Family History: The patient's family history includes Alzheimer's disease in her father and mother; Congestive Heart Failure in her father; Heart attack in her father, maternal grandfather, maternal grandmother, paternal grandfather, and paternal grandmother; Heart disease in her father; Heart failure in her father; Hyperlipidemia in her father; Prostate cancer in her father; Stroke in her paternal grandmother. ROS:   Please see the history of present illness.    All other systems reviewed and are  negative.  EKGs/Labs/Other Studies Reviewed:    The following studies were reviewed today  Recent Labs: 04/26/2018: Hemoglobin 13.3; Platelets 243; Potassium 4.1; Sodium 141  Recent Lipid Panel    Component Value Date/Time   CHOL 192 04/08/2018 0945   TRIG 152 (H) 04/08/2018 0945   HDL 46 04/08/2018 0945   CHOLHDL 4.2 04/08/2018 0945   CHOLHDL 5 03/04/2015 0824   VLDL 20.8 03/04/2015 0824   LDLCALC 116 (H) 04/08/2018 0945    Physical Exam:    VS:  BP 106/60 (BP Location: Left Arm, Patient Position: Sitting, Cuff Size: Normal)   Pulse 86   Ht 5' 4.5" (1.638 m)   Wt 152 lb 6.4 oz (69.1 kg)   SpO2 96%   BMI 25.76 kg/m     Wt Readings from Last 3 Encounters:  09/04/18 152 lb 6.4 oz (69.1 kg)  08/21/18 150 lb 2 oz (68.1 kg)  04/26/18 146 lb 11.2 oz (66.5 kg)     GEN:  Well nourished, well developed in no acute distress HEENT: Normal NECK: No JVD; No carotid bruits LYMPHATICS: No lymphadenopathy CARDIAC: RRR, no murmurs, rubs, gallops RESPIRATORY:  Clear to auscultation without rales, wheezing or rhonchi  ABDOMEN: Soft, non-tender, non-distended MUSCULOSKELETAL:  No edema; No deformity  SKIN: Warm and dry NEUROLOGIC:  Alert and oriented x 3 PSYCHIATRIC:  Normal affect    Signed, Shirlee More, MD  09/04/2018 4:38 PM    [facility]

## 2018-09-07 NOTE — Progress Notes (Signed)
Cardiology Office Note:    Date:  09/09/2018   ID:  Tina [name], DOB 1951-05-22, MRN [id number]  PCP:  Serita Grammes, MD  Cardiologist:  Shirlee More, MD    Referring MD: Serita Grammes, MD    ASSESSMENT:    1. PAF (paroxysmal atrial fibrillation) ([place])   2. High risk medication use   3. Chronic anticoagulation    PLAN:    In order of problems listed above:  1. Stable no recurrence he tolerates low-dose flecainide will continue the same along with her anticoagulant and leaves for [place] tomorrow.  Her EKG shows sinus rhythm normal conduction intervals. 2. Continue flecainide 3. Continue her anticoagulant   Next appointment: 6 weeks   Medication Adjustments/Labs and Tests Ordered: Current medicines are reviewed at length with the patient today.  Concerns regarding medicines are outlined above.  No orders of the defined types were placed in this encounter.  No orders of the defined types were placed in this encounter.   Chief Complaint  Patient presents with  . Follow-up    after starting flecanide    History of Present Illness:    Tina [name] is a 68 y.o. female with a hx of SVT syncope and recent PAF started on flecanide and apixaban last seen last week after an event monitor showed atrial fibrillation initiate a low-dose flecainide and anticoagulant.  Since then she is done well no palpitation shortness of breath chest pain dizziness or syncope.  No bleeding complication and stopped her aspirin.  She leaves for [place] tomorrow. Compliance with diet, lifestyle and medications: Yes Past Medical History:  Diagnosis Date  . Abnormal mammogram of right breast 10/2016  . Aortic atherosclerosis ([place])   . Arthritis    right Thumb  . Chest pain in adult    Normal MPS 2016  . Complication of anesthesia    prolonged sedation  . Dental crowns present   . Dupuytren contracture    Bilateral hands, mild  . GERD (gastroesophageal reflux disease)   .  Gestational diabetes    history of  . Grade I diastolic dysfunction 89/38/1017   on ECHO  . Hepatitis    age 89 "infectious"  . History of blood transfusion 1982  . History of cardiomegaly 04/14/2017   Noted on CXR  . History of hepatitis as a child    "infectious", per pt.  . History of hiatal hernia   . History of pleurisy   . History of pneumonia   . Hyperlipidemia   . Irregular heartbeat    since age 13, per pt.  . Limited joint range of motion    cervical spine - difficulty looking up for extended period  . MR (mitral regurgitation) 05/09/2017   Mild, noted on ECHO  . MVP (mitral valve prolapse)    Mild  . Orthostatic hypotension   . Osteoporosis   . PONV (postoperative nausea and vomiting)   . Pulmonary nodule 04/18/2017   5 mm left lower lobe nodule   . Seasonal allergies   . SUI (stress urinary incontinence, female)   . SVT (supraventricular tachycardia) ([place])   . TR (tricuspid regurgitation) 05/09/2017   Mild, noted on ECHO  . Wears contact lenses   . Wears glasses     Past Surgical History:  Procedure Laterality Date  . ANTERIOR CERVICAL DECOMP/DISCECTOMY FUSION  02/03/2002   C5-6  . BLADDER SUSPENSION N/A 04/26/2018   Procedure: TRANSVAGINAL TAPE (TVT) PROCEDURE;  Surgeon: Bobbye Charleston, MD;  Location:  [place];  Service: Gynecology;  Laterality: N/A;  . CESAREAN SECTION     x 3  . COLONOSCOPY  2013   several  . CYSTOCELE REPAIR N/A 04/26/2018   Procedure: ANTERIOR REPAIR (CYSTOCELE);  Surgeon: Bobbye Charleston, MD;  Location: [facility];  Service: Gynecology;  Laterality: N/A;  . CYSTOSCOPY N/A 04/26/2018   Procedure: CYSTOSCOPY;  Surgeon: Bobbye Charleston, MD;  Location: [facility];  Service: Gynecology;  Laterality: N/A;  . INGUINAL HERNIA REPAIR Right   . LAPAROSCOPIC NISSEN FUNDOPLICATION  99/37/1696  . NISSEN FUNDOPLICATION  78/93/8101   redo  . RADIOACTIVE SEED GUIDED EXCISIONAL BREAST  BIOPSY Right 11/08/2016   Procedure: RADIOACTIVE SEED GUIDED EXCISIONAL RIGHT BREAST BIOPSY;  Surgeon: Rolm Bookbinder, MD;  Location: [place];  Service: General;  Laterality: Right;  . TONSILLECTOMY  1977  . TUBAL LIGATION    . UPPER GI ENDOSCOPY     x2  . WISDOM TOOTH EXTRACTION      Current Medications: Current Meds  Medication Sig  . acetaminophen (TYLENOL) 325 MG tablet Take 325 mg by mouth as needed.  [name] apixaban (ELIQUIS) 5 MG TABS tablet Take 1 tablet (5 mg total) by mouth 2 (two) times daily.  . Beclomethasone Dipropionate (QVAR IN) Inhale 1 puff into the lungs daily.  . cetirizine (ZYRTEC) 10 MG tablet TAKE 1 TABLET (10 MG TOTAL) BY MOUTH DAILY.  [name] Coenzyme Q10 (COQ-10 PO) Take 1 capsule by mouth daily.   [name] dexlansoprazole (DEXILANT) 60 MG capsule Take 60 mg by mouth daily.  . flecainide (TAMBOCOR) 50 MG tablet Take 1 tablet (50 mg total) by mouth 2 (two) times daily.  . metoprolol tartrate (LOPRESSOR) 25 MG tablet Take 2 tablets (50 mg total) by mouth 2 (two) times daily.  . montelukast (SINGULAIR) 10 MG tablet Take 10 mg by mouth at bedtime.  . Multiple Vitamins-Minerals (CENTRUM SILVER PO) Take 1 tablet by mouth daily.   . Omega-3 Fatty Acids (FISH OIL PO) Take 1 capsule by mouth daily.   . pravastatin (PRAVACHOL) 20 MG tablet Take 1 tablet (20 mg total) by mouth every evening.  . risedronate (ACTONEL) 35 MG tablet TAKE 1 TABLET BY MOUTH EVERY 7 DAYS WITH WATER ON EMPTY STOMACH, THEN NO FOOD, STAY UPRIGHT 30MINUTE     Allergies:   Codeine; Erythromycin; and Simvastatin   Social History   Socioeconomic History  . Marital status: Married    Spouse name: Not on file  . Number of children: Not on file  . Years of education: Not on file  . Highest education level: Not on file  Occupational History  . Not on file  Social Needs  . Financial resource strain: Not on file  . Food insecurity:    Worry: Not on file    Inability: Not on file  .  Transportation needs:    Medical: Not on file    Non-medical: Not on file  Tobacco Use  . Smoking status: Never Smoker  . Smokeless tobacco: Never Used  Substance and Sexual Activity  . Alcohol use: No  . Drug use: No  . Sexual activity: Not on file  Lifestyle  . Physical activity:    Days per week: Not on file    Minutes per session: Not on file  . Stress: Not on file  Relationships  . Social connections:    Talks on phone: Not on file    Gets together: Not on file  Attends religious service: Not on file    Active member of club or organization: Not on file    Attends meetings of clubs or organizations: Not on file    Relationship status: Not on file  Other Topics Concern  . Not on file  Social History Narrative   Retired high school business teacher   Master's degree educatation   Married, lives with spouse -     Family History: The patient's family history includes Alzheimer's disease in her father and mother; Congestive Heart Failure in her father; Heart attack in her father, maternal grandfather, maternal grandmother, paternal grandfather, and paternal grandmother; Heart disease in her father; Heart failure in her father; Hyperlipidemia in her father; Prostate cancer in her father; Stroke in her paternal grandmother. ROS:   Please see the history of present illness.    All other systems reviewed and are negative.  EKGs/Labs/Other Studies Reviewed:    The following studies were reviewed today:  EKG:  EKG ordered today.  The ekg ordered today demonstrates [place] normal QTC  Recent Labs: 04/26/2018: Hemoglobin 13.3; Platelets 243; Potassium 4.1; Sodium 141  Recent Lipid Panel    Component Value Date/Time   CHOL 192 04/08/2018 0945   TRIG 152 (H) 04/08/2018 0945   HDL 46 04/08/2018 0945   CHOLHDL 4.2 04/08/2018 0945   CHOLHDL 5 03/04/2015 0824   VLDL 20.8 03/04/2015 0824   LDLCALC 116 (H) 04/08/2018 0945    Physical Exam:    VS:  BP 120/76 (BP Location:  Right Arm, Patient Position: Sitting, Cuff Size: Normal)   Pulse 66   Ht 5' 4.5" (1.638 m)   Wt 148 lb (67.1 kg)   SpO2 97%   BMI 25.01 kg/m     Wt Readings from Last 3 Encounters:  09/09/18 148 lb (67.1 kg)  09/04/18 152 lb 6.4 oz (69.1 kg)  08/21/18 150 lb 2 oz (68.1 kg)     GEN:  Well nourished, well developed in no acute distress HEENT: Normal NECK: No JVD; No carotid bruits LYMPHATICS: No lymphadenopathy CARDIAC: RRR, no murmurs, rubs, gallops RESPIRATORY:  Clear to auscultation without rales, wheezing or rhonchi  ABDOMEN: Soft, non-tender, non-distended MUSCULOSKELETAL:  No edema; No deformity  SKIN: Warm and dry NEUROLOGIC:  Alert and oriented x 3 PSYCHIATRIC:  Normal affect    Signed, Shirlee More, MD  09/09/2018 11:07 AM    [place]

## 2018-09-09 ENCOUNTER — Ambulatory Visit: Payer: Medicare Other | Admitting: Cardiology

## 2018-09-09 ENCOUNTER — Encounter: Payer: Self-pay | Admitting: Cardiology

## 2018-09-09 VITALS — BP 120/76 | HR 66 | Ht 64.5 in | Wt 148.0 lb

## 2018-09-09 DIAGNOSIS — Z79899 Other long term (current) drug therapy: Secondary | ICD-10-CM

## 2018-09-09 DIAGNOSIS — I480 Paroxysmal atrial fibrillation: Secondary | ICD-10-CM | POA: Diagnosis not present

## 2018-09-09 DIAGNOSIS — Z7901 Long term (current) use of anticoagulants: Secondary | ICD-10-CM | POA: Insufficient documentation

## 2018-09-09 HISTORY — DX: Other long term (current) drug therapy: Z79.899

## 2018-09-09 HISTORY — DX: Long term (current) use of anticoagulants: Z79.01

## 2018-09-09 NOTE — Patient Instructions (Signed)
Medication Instructions:  Your physician recommends that you continue on your current medications as directed. Please refer to the Current Medication list given to you today.  If you need a refill on your cardiac medications before your next appointment, please call your pharmacy.   Lab work: NONE  Testing/Procedures: NONE  Follow-Up: At [facility], you and your health needs are our priority.  As part of our continuing mission to provide you with exceptional heart care, we have created designated Provider Care Teams.  These Care Teams include your primary Cardiologist (physician) and Advanced Practice Providers (APPs -  Physician Assistants and Nurse Practitioners) who all work together to provide you with the care you need, when you need it. You will need a follow up appointment in 6 weeks.

## 2018-09-26 DIAGNOSIS — Z9109 Other allergy status, other than to drugs and biological substances: Secondary | ICD-10-CM

## 2018-09-26 DIAGNOSIS — E041 Nontoxic single thyroid nodule: Secondary | ICD-10-CM

## 2018-09-26 HISTORY — DX: Other allergy status, other than to drugs and biological substances: Z91.09

## 2018-09-26 HISTORY — DX: Nontoxic single thyroid nodule: E04.1

## 2018-10-07 ENCOUNTER — Ambulatory Visit: Payer: Medicare Other | Admitting: Cardiology

## 2018-10-11 IMAGING — CR DG CHEST 2V
2 series · 2 of 2 positions shown · non-contrast
Comparison: 06/03/2014

CLINICAL DATA: Pt c/o acute left side chest pain onset approx 30
min. ago. Pain radiating into left shoulder and left axillary
region. Describes pain as sharp. H/o irregular heartbeat and
supraventricular tachycardia. Nonsmoker.

EXAM:
CHEST  2 VIEW

[w chest pa]
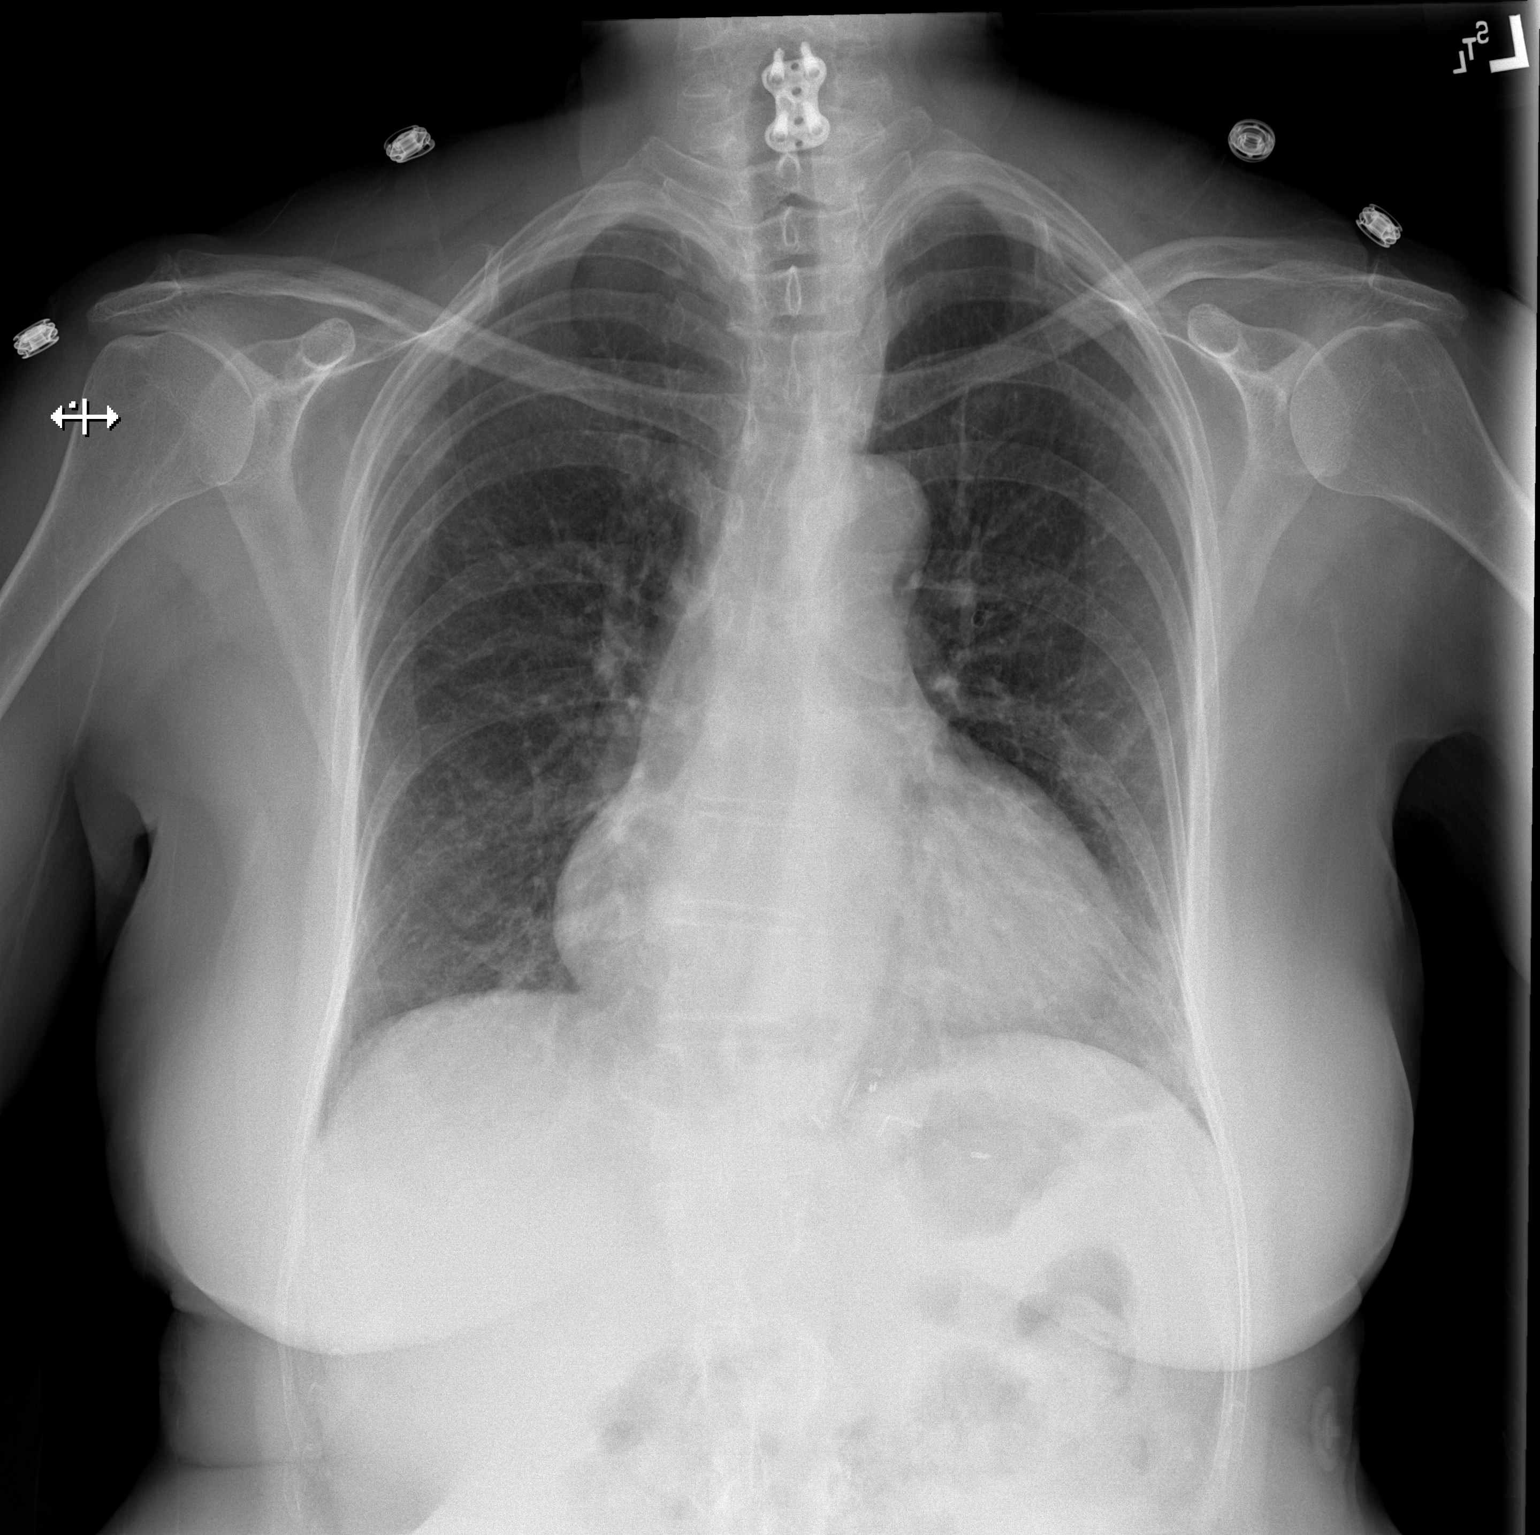

[w chest lat]
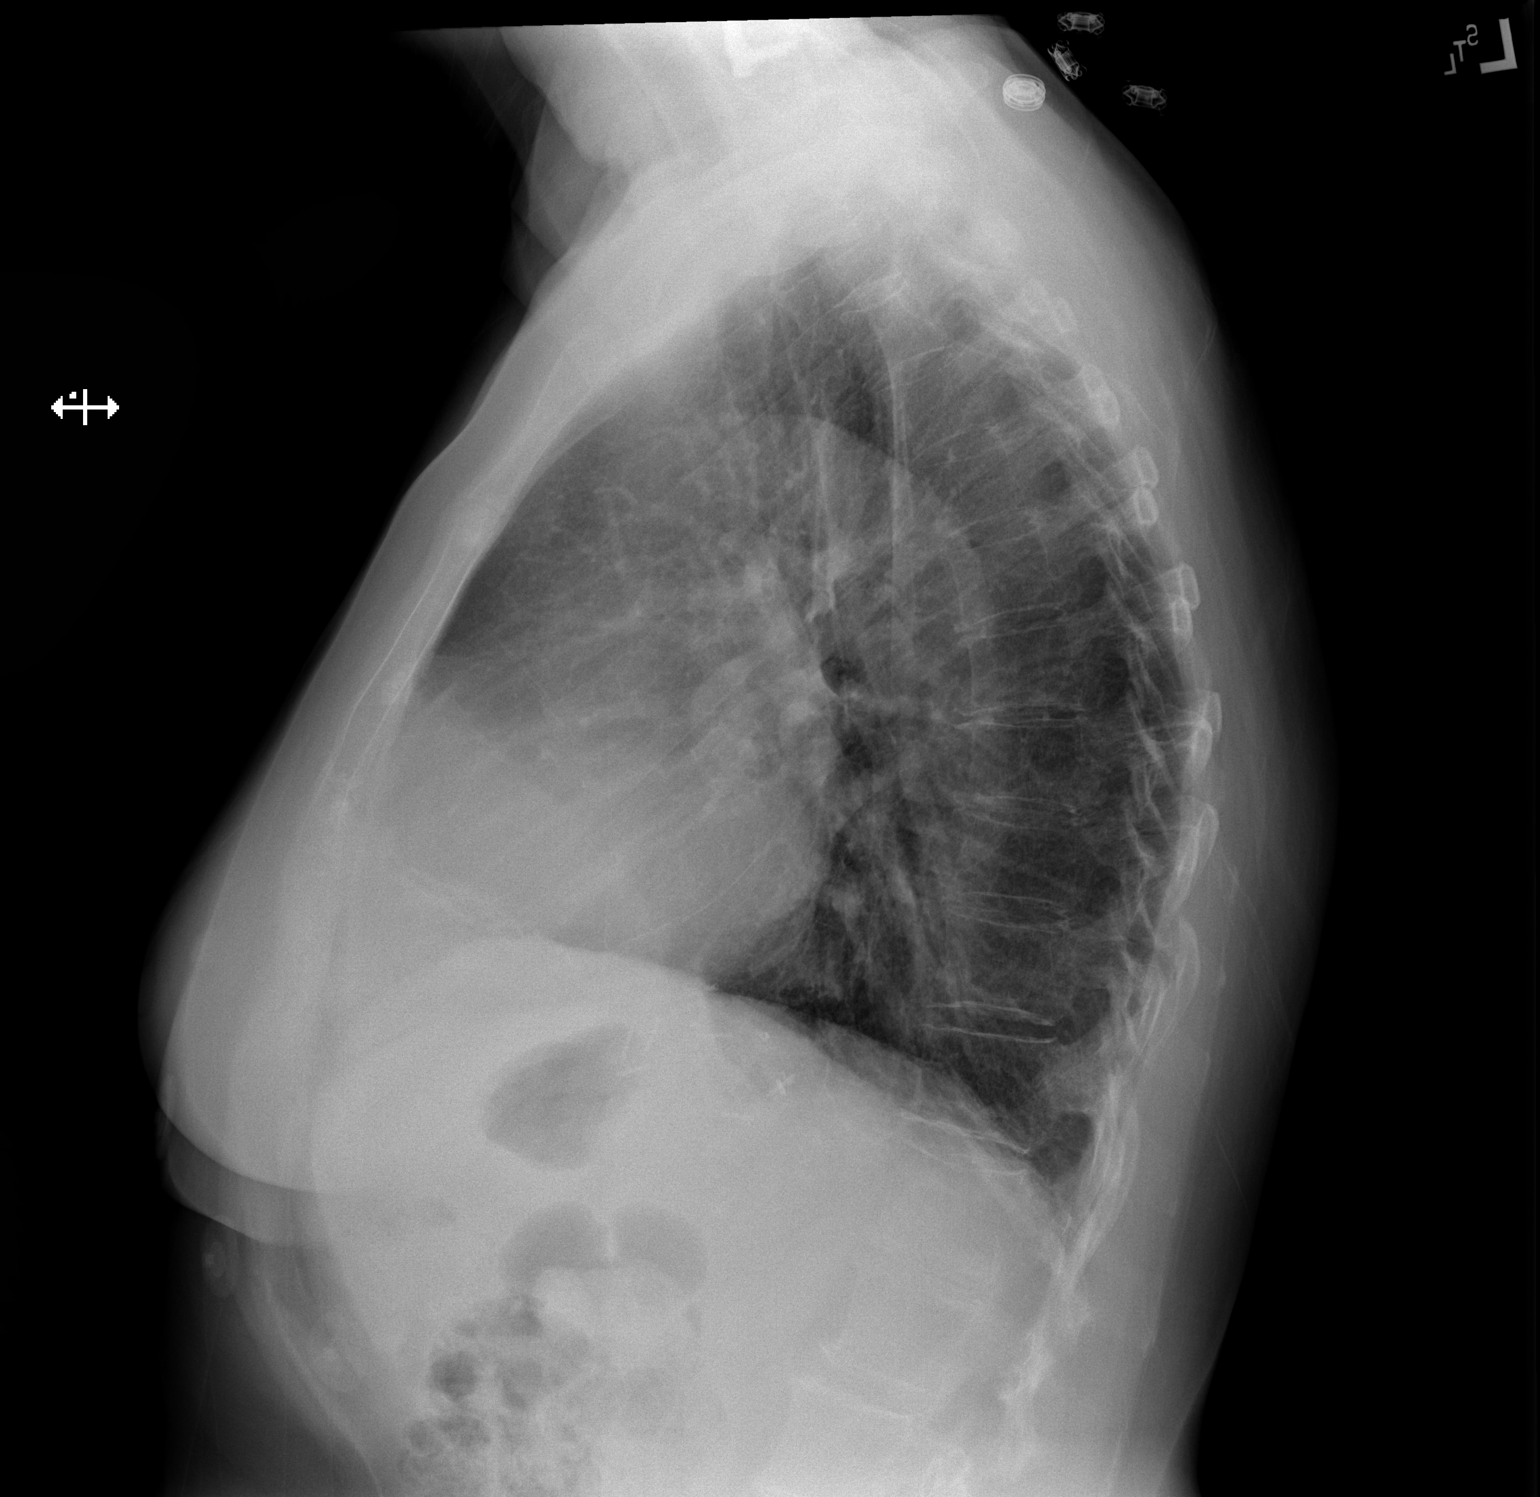

[2 of 2 positions shown; findings below may reference images not displayed]

FINDINGS: Heart size is mildly enlarged . There are no focal consolidations or
pleural effusions. No pulmonary edema. Surgical clips are noted in
the left upper quadrant. Status post cervical fusion.
IMPRESSION: Mild cardiomegaly.

## 2018-10-21 ENCOUNTER — Telehealth: Payer: Self-pay

## 2018-10-21 NOTE — Telephone Encounter (Signed)
Cardiac Questionnaire:    Since your last visit or hospitalization:    1. Have you been having new or worsening chest pain?No   2. Have you been having new or worsening shortness of breath? No 3. Have you been having new or worsening leg swelling, wt gain, or increase in abdominal girth (pants fitting more tightly)? No   4. Have you had any passing out spells? No   COVID-19 Pre-Screening Questions:  . Do you currently have a fever? No . Have you recently travelled on a cruise, internationally, or to [place], [place], [place], [place], [place], or [place], [place] [employer]) ? No . Have you been in contact with someone that is currently pending confirmation of Covid19 testing or has been confirmed to have the [place] virus?  No . Are you currently experiencing fatigue or cough? No         TELEPHONE CALL NOTE  Tina [name] has been deemed a candidate for a follow-up tele-health visit to limit community exposure during the Covid-19 pandemic. I spoke with the patient via phone to ensure availability of phone/video source, confirm preferred email & phone number, and discuss instructions and expectations.  I reminded Tina [name] to be prepared with any vital sign and/or heart rhythm information that could potentially be obtained via home monitoring, at the time of her visit. I reminded Tina [name] to expect a phone call at the time of her visit if her visit.  Did the patient verbally acknowledge consent to treatment?[name], [place] 10/21/2018  10:32 AM   DOWNLOADING THE [place] TO SMARTPHONE  - If Apple, go to [employer] and type in WebEx in the search bar. [place] [employer], the blue/green circle. The app is free but as with any other app downloads, their phone may require them to verify saved payment information or Apple password. The patient does NOT have to create an account.  - If Android, ask patient to go to [employer] and type in WebEx in the search bar.  [place] [employer], the blue/green circle. The app is free but as with any other app downloads, their phone may require them to verify saved payment information or Android password. The patient does NOT have to create an account.   CONSENT FOR TELE-HEALTH VISIT - PLEASE REVIEW  I hereby voluntarily request, consent and authorize [facility] and its employed or contracted physicians, physician assistants, nurse practitioners or other licensed health care professionals (the Practitioner), to provide me with telemedicine health care services (the "Services") as deemed necessary by the treating Practitioner. I acknowledge and consent to receive the Services by the Practitioner via telemedicine. I understand that the telemedicine visit will involve communicating with the Practitioner through live audiovisual communication technology and the disclosure of certain medical information by electronic transmission. I acknowledge that I have been given the opportunity to request an in-person assessment or other available alternative prior to the telemedicine visit and am voluntarily participating in the telemedicine visit.  I understand that I have the right to withhold or withdraw my consent to the use of telemedicine in the course of my care at any time, without affecting my right to future care or treatment, and that the Practitioner or I may terminate the telemedicine visit at any time. I understand that I have the right to inspect all information obtained and/or recorded in the course of the telemedicine visit and may receive copies of available information for a reasonable fee.  I understand that some of the potential risks of receiving the Services via telemedicine include:  [name] Delay or interruption in medical evaluation due to technological equipment failure or disruption; . Information transmitted may not be sufficient (e.g. poor resolution of images) to allow for appropriate medical decision  making by the Practitioner; and/or  . In rare instances, security protocols could fail, causing a breach of personal health information.  Furthermore, I acknowledge that it is my responsibility to provide information about my medical history, conditions and care that is complete and accurate to the best of my ability. I acknowledge that Practitioner's advice, recommendations, and/or decision may be based on factors not within their control, such as incomplete or inaccurate data provided by me or distortions of diagnostic images or specimens that may result from electronic transmissions. I understand that the practice of medicine is not an exact science and that Practitioner makes no warranties or guarantees regarding treatment outcomes. I acknowledge that I will receive a copy of this consent concurrently upon execution via email to the email address I last provided but may also request a printed copy by calling the office of [place].    I understand that my insurance will be billed for this visit.   I have read or had this consent read to me. . I understand the contents of this consent, which adequately explains the benefits and risks of the Services being provided via telemedicine.  . I have been provided ample opportunity to ask questions regarding this consent and the Services and have had my questions answered to my satisfaction. . I give my informed consent for the services to be provided through the use of telemedicine in my medical care  By participating in this telemedicine visit I agree to the above.   Patient gave verbal consent to have Webex meeting with Dr Bettina Gavia, and consent was also sent by MyChart for her review.

## 2018-10-22 ENCOUNTER — Encounter: Payer: Self-pay | Admitting: Cardiology

## 2018-10-22 ENCOUNTER — Telehealth (INDEPENDENT_AMBULATORY_CARE_PROVIDER_SITE_OTHER): Payer: Medicare Other | Admitting: Cardiology

## 2018-10-22 VITALS — BP 124/83 | HR 80 | Ht 64.5 in | Wt 147.5 lb

## 2018-10-22 DIAGNOSIS — Z7901 Long term (current) use of anticoagulants: Secondary | ICD-10-CM

## 2018-10-22 DIAGNOSIS — I480 Paroxysmal atrial fibrillation: Secondary | ICD-10-CM

## 2018-10-22 DIAGNOSIS — Z79899 Other long term (current) drug therapy: Secondary | ICD-10-CM

## 2018-10-22 NOTE — Progress Notes (Signed)
Virtual Visit via Video Note   This visit type was conducted due to national recommendations for restrictions regarding the COVID-19 Pandemic (e.g. social distancing) in an effort to limit this patient's exposure and mitigate transmission in our community.  Due to her co-morbid illnesses, this patient is at least at moderate risk for complications without adequate follow up.  This format is felt to be most appropriate for this patient at this time.  All issues noted in this document were discussed and addressed.  A limited physical exam was performed with this format.  Please refer to the patient's chart for her consent to telehealth for [facility].   Evaluation Performed:  Follow-up visit  Date:  10/22/2018   ID:  Tina [name], Tina [name] Mar 14, 1951, MRN [id number]  Patient Location: Home  Provider Location: Home  PCP:  Serita Grammes, MD  Cardiologist:  No primary care provider on file. Dr Bettina Gavia Electrophysiologist:  None   Chief Complaint:  Atrial fibrillation  History of Present Illness:    Tina [name] is a 68 y.o. female who presents via audio/video conferencing for a telehealth visit today.    Tina [name] is a 68 y.o. female with a hx of SVT syncope and recent PAF started on flecanide and apixaban last seen last week after an event monitor showed atrial fibrillation.  She was last seen 09/09/2018.  Just prior to her trip to [place] she was found to have atrial fibrillation.  She has placed on flecainide anticoagulant enjoyed her trip and had no problems.  She feels better and that her previous palpitation episodes of rapid heart rhythm have resolved she has had no recurrent syncope and has had no bleeding complication.  There is been no edema orthopnea shortness of breath or TIA.  Last evening she a little bit of left-sided pleuritic discomfort and took a Celebrex with relief.  There is been no recurrence.  She is not having fever cough or shortness of breath The patient  does not have symptoms concerning for COVID-19 infection (fever, chills, cough, or new shortness of breath).    Past Medical History:  Diagnosis Date  . Abnormal mammogram of right breast 10/2016  . Aortic atherosclerosis ([place])   . Arthritis    right Thumb  . Chest pain in adult    Normal MPS 2016  . Complication of anesthesia    prolonged sedation  . Dental crowns present   . Dupuytren contracture    Bilateral hands, mild  . GERD (gastroesophageal reflux disease)   . Gestational diabetes    history of  . Grade I diastolic dysfunction 36/14/4315   on ECHO  . 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 History of blood transfusion 1982  . History of cardiomegaly 04/14/2017   Noted on CXR  . History of hepatitis as a child    "infectious", per pt.  . History of hiatal hernia   . History of pleurisy   . History of pneumonia   . Hyperlipidemia   . Irregular heartbeat    since age 72, per pt.  . Limited joint range of motion    cervical spine - difficulty looking up for extended period  . MR (mitral regurgitation) 05/09/2017   Mild, noted on ECHO  . MVP (mitral valve prolapse)    Mild  . Orthostatic hypotension   . Osteoporosis   . PONV (postoperative nausea and vomiting)   . Pulmonary nodule 04/18/2017   5 mm left lower lobe  nodule   . Seasonal allergies   . SUI (stress urinary incontinence, female)   . SVT (supraventricular tachycardia) ([place])   . TR (tricuspid regurgitation) 05/09/2017   Mild, noted on ECHO  . Wears contact lenses   . Wears glasses    Past Surgical History:  Procedure Laterality Date  . ANTERIOR CERVICAL DECOMP/DISCECTOMY FUSION  02/03/2002   C5-6  . BLADDER SUSPENSION N/A 04/26/2018   Procedure: TRANSVAGINAL TAPE (TVT) PROCEDURE;  Surgeon: Bobbye Charleston, MD;  Location: [place] [facility];  Service: Gynecology;  Laterality: N/A;  . CESAREAN SECTION     x 3  . COLONOSCOPY  2013   several  . CYSTOCELE REPAIR N/A 04/26/2018   Procedure:  ANTERIOR REPAIR (CYSTOCELE);  Surgeon: Bobbye Charleston, MD;  Location[facility];  Service: Gynecology;  Laterality: N/A;  . CYSTOSCOPY N/A 04/26/2018   Procedure: CYSTOSCOPY;  Surgeon: Bobbye Charleston, MD;  Location: [facility];  Service: Gynecology;  Laterality: N/A;  . INGUINAL HERNIA REPAIR Right   . LAPAROSCOPIC NISSEN FUNDOPLICATION  60/04/9322  . NISSEN FUNDOPLICATION  55/73/2202   redo  . RADIOACTIVE SEED GUIDED EXCISIONAL BREAST BIOPSY Right 11/08/2016   Procedure: RADIOACTIVE SEED GUIDED EXCISIONAL RIGHT BREAST BIOPSY;  Surgeon: Rolm Bookbinder, MD;  Location: [place];  Service: General;  Laterality: Right;  . TONSILLECTOMY  1977  . TUBAL LIGATION    . UPPER GI ENDOSCOPY     x2  . WISDOM TOOTH EXTRACTION       No outpatient medications have been marked as taking for the 10/22/18 encounter (Appointment) with Richardo Priest, MD.     Allergies:   Codeine; Erythromycin; and Simvastatin   Social History   Tobacco Use  . Smoking status: Never Smoker  . Smokeless tobacco: Never Used  Substance Use Topics  . Alcohol use: No  . Drug use: No     Family Hx: The patient's family history includes Alzheimer's disease in her father and mother; Congestive Heart Failure in her father; Heart attack in her father, maternal grandfather, maternal grandmother, paternal grandfather, and paternal grandmother; Heart disease in her father; Heart failure in her father; Hyperlipidemia in her father; Prostate cancer in her father; Stroke in her paternal grandmother.  ROS:   Please see the history of present illness.     All other systems reviewed and are negative.   Prior CV studies:   The following studies were reviewed today:  Study Highlights   A 3-day ZIO monitor was performed to evaluate syncope and SVT initiating 08/23/2018.   The predominant rhythm is sinus with minimum average and maximum heart rates of 59, 76 and 127 bpm.   Atrial fibrillation occurred 4% burden average rate 89 bpm minimum maximum 65 and 149 bpm.  The atrial fibrillation was symptomatic with palpitation.   There were no pauses of 3 seconds or greater and there was no AV block or sinus node exit block.   Rare ventricular ectopy was seen less than 1% there was a strip present as ventricular tachycardia which was a brief run of atrial premature beats.   Rare supraventricular ectopy is seen along with brief runs of APCs and a 4% burden of atrial fibrillation or atypical atrial flutter.   Triggered events were present predominantly with supraventricular and ventricular ectopy.       Conclusion paroxysmal atrial fibrillation 4% burden     Labs/Other Tests and Data Reviewed:    EKG:  An ECG dated 09/09/18 was  personally reviewed today and demonstrated:  [place] minor nonspecific ST abnormality on flecanide  Recent Labs: 04/26/2018: Hemoglobin 13.3; Platelets 243; Potassium 4.1; Sodium 141      Recent Lipid Panel   08/07/2018 cholesterol 230 HDL 44 LDL 149 Lab Results  Component Value Date/Time   CHOL 192 04/08/2018 09:45 AM   TRIG 152 (H) 04/08/2018 09:45 AM   HDL 46 04/08/2018 09:45 AM   CHOLHDL 4.2 04/08/2018 09:45 AM   CHOLHDL 5 03/04/2015 08:24 AM   LDLCALC 116 (H) 04/08/2018 09:45 AM    Wt Readings from Last 3 Encounters:  09/09/18 148 lb (67.1 kg)  09/04/18 152 lb 6.4 oz (69.1 kg)  08/21/18 150 lb 2 oz (68.1 kg)     Objective:    Vital Signs:  There were no vitals taken for this visit.   Well nourished, well developed female in no acute distress.   ASSESSMENT & PLAN:    1. Atrial fibrillation, paroxysmal improved asymptomatic tolerating her current antiarrhythmic drug flecainide we did an EKG in the [facility] prior to her visit that showed no toxicity.  She will continue her beta-blocker flecainide and anticoagulant.  Continue to avoid over-the-counter proarrhythmic drugs.  We will do an EKG at the time of her office visit the  summer 2. Stable continue flecainide no evidence of toxicity 3. Stable continue her current antiarrhythmic drug she has had no bleeding complication 4. Idiopathic pleurisy she had a little bit of chest pain last night and I told her she can take Celebrex as needed  COVID-19 Education: The signs and symptoms of COVID-19 were discussed with the patient and how to seek care for testing (follow up with PCP or arrange E-visit).  The importance of social distancing was discussed today.  Time:   Today, I have spent 25 minutes with the patient with telehealth technology discussing the above problems.     Medication Adjustments/Labs and Tests Ordered: Current medicines are reviewed at length with the patient today.  Concerns regarding medicines are outlined above.  Tests Ordered: No orders of the defined types were placed in this encounter.  Medication Changes: No orders of the defined types were placed in this encounter.   Disposition:  Follow up July or August 2020  SignedShirlee More, MD  10/22/2018 2:24 PM    [place]

## 2018-10-22 NOTE — Progress Notes (Signed)
[facility] EKG monitor strips transmitted to my office from today 09/21/18 through my chart shows sinus rhythm normal conduction intervals independent and personally reviewed by me

## 2018-10-22 NOTE — Patient Instructions (Signed)
Medication Instructions:  Your physician recommends that you continue on your current medications as directed. Please refer to the Current Medication list given to you today.  If you need a refill on your cardiac medications before your next appointment, please call your pharmacy.   Lab work: NONE If you have labs (blood work) drawn today and your tests are completely normal, you will receive your results only by: [facility] (if you have MyChart) OR A paper copy in the mail If you have any lab test that is abnormal or we need to change your treatment, we will call you to review the results.  Testing/Procedures: NONE  Follow-Up: At [facility], you and your health needs are our priority. As part of our continuing mission to provide you with exceptional heart care, we have created designated Provider Care Teams. These Care Teams include your primary Cardiologist (physician) and Advanced Practice Providers (APPs -  Physician Assistants and Nurse Practitioners) who all work together to provide you with the care you need, when you need it. You are scheduled to see Dr Bettina Gavia on 02-24-2019 at 11:20am.  Please arrive at 11:00am

## 2018-10-24 ENCOUNTER — Telehealth: Payer: Self-pay

## 2018-10-24 NOTE — Telephone Encounter (Signed)
-----   Message from Richardo Priest, MD sent at 10/24/2018  8:24 AM EDT ----- The strips this AM 10/24/18 are all normal Alive Cor 3 recordings

## 2018-10-24 NOTE — Telephone Encounter (Signed)
Patient advised that Dr Bettina Gavia has reviewed Alive Cor readings from [facility] device that were sent on 10-24-2018 and they are all normal readings. Patient verbalized understanding.

## 2018-10-28 ENCOUNTER — Ambulatory Visit: Payer: Medicare Other | Admitting: Cardiology

## 2018-11-25 ENCOUNTER — Other Ambulatory Visit: Payer: Self-pay | Admitting: Cardiology

## 2018-11-25 NOTE — Telephone Encounter (Signed)
Rx refill sent to pharmacy. 

## 2018-11-27 ENCOUNTER — Other Ambulatory Visit: Payer: Self-pay | Admitting: *Deleted

## 2018-11-27 MED ORDER — PRAVASTATIN SODIUM 20 MG PO TABS: 20.0000 mg | ORAL_TABLET | Freq: Every evening | ORAL | 1 refills | Status: DC

## 2018-12-21 ENCOUNTER — Other Ambulatory Visit: Payer: Self-pay | Admitting: Cardiology

## 2019-02-24 ENCOUNTER — Ambulatory Visit: Payer: Medicare Other | Admitting: Cardiology

## 2019-03-04 NOTE — Progress Notes (Signed)
Cardiology Office Note:    Date:  03/05/2019   ID:  Tina [name], DOB 1951/01/29, MRN [id number]  PCP:  Serita Grammes, MD  Cardiologist:  Shirlee More, MD    Referring MD: Serita Grammes, MD    ASSESSMENT:    1. PAF (paroxysmal atrial fibrillation) ([place])   2. High risk medication use   3. Chronic anticoagulation   4. Hypercholesterolemia    PLAN:    In order of problems listed above:  1. Stable continue flecainide beta-blocker anticoagulant with new right bundle branch block check flecainide level regarding toxicity 2. See above continue flecainide 3. Continue anticoagulant 4. Continue statin check CMP flecainide level lipid profile   Next appointment: 6 months   Medication Adjustments/Labs and Tests Ordered: Current medicines are reviewed at length with the patient today.  Concerns regarding medicines are outlined above.  No orders of the defined types were placed in this encounter.  No orders of the defined types were placed in this encounter.   Chief Complaint  Patient presents with  . Follow-up    for   . Atrial Fibrillation  . Anticoagulation    History of Present Illness:     Tina [name] is a 68 y.o. female with a hx of SVT syncope and PAF  last seen 94/01/2019. Compliance with diet, lifestyle and medications: Yes  she has had mild palpitation and is checked her heart rhythm with the iPhone adapter and although normal sinus rhythm.  Said no side effects from flecainide edema shortness of breath chest pain syncope or bleeding or anticoagulant Past Medical History:  Diagnosis Date  . Abnormal mammogram of right breast 10/2016  . Aortic atherosclerosis ([place])   . Arthritis    right Thumb  . Chest pain in adult    Normal MPS 2016  . Complication of anesthesia    prolonged sedation  . Dental crowns present   . Dupuytren contracture    Bilateral hands, mild  . GERD (gastroesophageal reflux disease)   . Gestational diabetes    history of   . Grade I diastolic dysfunction 10/93/2355   on ECHO  . Hepatitis    age 72 "infectious"  . History of blood transfusion 1982  . History of cardiomegaly 04/14/2017   Noted on CXR  . History of hepatitis as a child    "infectious", per pt.  . History of hiatal hernia   . History of pleurisy   . History of pneumonia   . Hyperlipidemia   . Irregular heartbeat    since age 72, per pt.  . Limited joint range of motion    cervical spine - difficulty looking up for extended period  . MR (mitral regurgitation) 05/09/2017   Mild, noted on ECHO  . MVP (mitral valve prolapse)    Mild  . Orthostatic hypotension   . Osteoporosis   . PONV (postoperative nausea and vomiting)   . Pulmonary nodule 04/18/2017   5 mm left lower lobe nodule   . Seasonal allergies   . SUI (stress urinary incontinence, female)   . SVT (supraventricular tachycardia) ([place])   . TR (tricuspid regurgitation) 05/09/2017   Mild, noted on ECHO  . Wears contact lenses   . Wears glasses     Past Surgical History:  Procedure Laterality Date  . ANTERIOR CERVICAL DECOMP/DISCECTOMY FUSION  02/03/2002   C5-6  . BLADDER SUSPENSION N/A 04/26/2018   Procedure: TRANSVAGINAL TAPE (TVT) PROCEDURE;  Surgeon: Bobbye Charleston, MD;  Location: [facility];  Service: Gynecology;  Laterality: N/A;  . CESAREAN SECTION     x 3  . COLONOSCOPY  2013   several  . CYSTOCELE REPAIR N/A 04/26/2018   Procedure: ANTERIOR REPAIR (CYSTOCELE);  Surgeon: Bobbye Charleston, MD;  [facility];  Service: Gynecology;  Laterality: N/A;  . CYSTOSCOPY N/A 04/26/2018   Procedure: CYSTOSCOPY;  Surgeon: Bobbye Charleston, MD;  Location: [facility];  Service: Gynecology;  Laterality: N/A;  . INGUINAL HERNIA REPAIR Right   . LAPAROSCOPIC NISSEN FUNDOPLICATION  66/59/9357  . NISSEN FUNDOPLICATION  01/77/9390   redo  . RADIOACTIVE SEED GUIDED EXCISIONAL BREAST BIOPSY Right 11/08/2016   Procedure:  RADIOACTIVE SEED GUIDED EXCISIONAL RIGHT BREAST BIOPSY;  Surgeon: Rolm Bookbinder, MD;  Location: [facility];  Service: General;  Laterality: Right;  . TONSILLECTOMY  1977  . TUBAL LIGATION    . UPPER GI ENDOSCOPY     x2  . WISDOM TOOTH EXTRACTION      Current Medications: Current Meds  Medication Sig  . acetaminophen (TYLENOL) 325 MG tablet Take 325 mg by mouth as needed.  . Beclomethasone Dipropionate (QVAR IN) Inhale 1 puff into the lungs daily.  . cetirizine (ZYRTEC) 10 MG tablet TAKE 1 TABLET (10 MG TOTAL) BY MOUTH DAILY.  [name] Coenzyme Q10 (COQ-10 PO) Take 1 capsule by mouth daily.   [name] ELIQUIS 5 MG TABS tablet TAKE 1 TABLET BY MOUTH TWICE A DAY  . famotidine (PEPCID) 20 MG tablet Take 20 mg by mouth 2 (two) times daily as needed for heartburn or indigestion.  . flecainide (TAMBOCOR) 50 MG tablet TAKE 1 TABLET BY MOUTH TWICE A DAY  . metoprolol tartrate (LOPRESSOR) 25 MG tablet Take 2 tablets (50 mg total) by mouth 2 (two) times daily.  . montelukast (SINGULAIR) 10 MG tablet Take 10 mg by mouth at bedtime.  . Multiple Vitamins-Minerals (CENTRUM SILVER PO) Take 1 tablet by mouth daily.   . NON FORMULARY MIXED VIAL # 4 CONC. SLIT- sublingual drop for seasonal allergies  . Omega-3 Fatty Acids (FISH OIL PO) Take 1 capsule by mouth daily.   . pravastatin (PRAVACHOL) 20 MG tablet Take 1 tablet (20 mg total) by mouth every evening.  . risedronate (ACTONEL) 35 MG tablet TAKE 1 TABLET BY MOUTH EVERY 7 DAYS WITH WATER ON EMPTY STOMACH, THEN NO FOOD, STAY UPRIGHT 30MINUTE     Allergies:   Codeine, Erythromycin, and Simvastatin   Social History   Socioeconomic History  . Marital status: Married    Spouse name: Not on file  . Number of children: Not on file  . Years of education: Not on file  . Highest education level: Not on file  Occupational History  . Not on file  Social Needs  . Financial resource strain: Not on file  . Food insecurity    Worry: Not on file     Inability: Not on file  . Transportation needs    Medical: Not on file    Non-medical: Not on file  Tobacco Use  . Smoking status: Never Smoker  . Smokeless tobacco: Never Used  Substance and Sexual Activity  . Alcohol use: No  . Drug use: No  . Sexual activity: Not on file  Lifestyle  . Physical activity    Days per week: Not on file    Minutes per session: Not on file  . Stress: Not on file  Relationships  . Social Herbalist on phone: Not  on file    Gets together: Not on file    Attends religious service: Not on file    Active member of club or organization: Not on file    Attends meetings of clubs or organizations: Not on file    Relationship status: Not on file  Other Topics Concern  . Not on file  Social History Narrative   Retired high school business teacher   Master's degree educatation   Married, lives with spouse -     Family History: The patient's family history includes Alzheimer's disease in her father and mother; Congestive Heart Failure in her father; Heart attack in her father, maternal grandfather, maternal grandmother, paternal grandfather, and paternal grandmother; Heart disease in her father; Heart failure in her father; Hyperlipidemia in her father; Prostate cancer in her father; Stroke in her paternal grandmother. ROS:   Please see the history of present illness.    All other systems reviewed and are negative.  EKGs/Labs/Other Studies Reviewed:    The following studies were reviewed today:  EKG:  EKG ordered today and personally reviewed.  The ekg ordered today demonstrates sinus rhythm she has a new right bundle branch block QRS is prolonged from baseline but mostly due to bundle branch block and will check a flecainide level today  Recent Labs: 04/26/2018: Hemoglobin 13.3; Platelets 243; Potassium 4.1; Sodium 141  Recent Lipid Panel    Component Value Date/Time   CHOL 192 04/08/2018 0945   TRIG 152 (H) 04/08/2018 0945   HDL 46  04/08/2018 0945   CHOLHDL 4.2 04/08/2018 0945   CHOLHDL 5 03/04/2015 0824   VLDL 20.8 03/04/2015 0824   LDLCALC 116 (H) 04/08/2018 0945    Physical Exam:    VS:  BP 102/64 (BP Location: Left Arm, Patient Position: Sitting, Cuff Size: Normal)   Ht 5' 4.5" (1.638 m)   Wt 146 lb 3.2 oz (66.3 kg)   SpO2 97%   BMI 24.71 kg/m     Wt Readings from Last 3 Encounters:  03/05/19 146 lb 3.2 oz (66.3 kg)  10/22/18 147 lb 8 oz (66.9 kg)  09/09/18 148 lb (67.1 kg)     GEN:  Well nourished, well developed in no acute distress HEENT: Normal NECK: No JVD; No carotid bruits LYMPHATICS: No lymphadenopathy CARDIAC: RRR, no murmurs, rubs, gallops RESPIRATORY:  Clear to auscultation without rales, wheezing or rhonchi  ABDOMEN: Soft, non-tender, non-distended MUSCULOSKELETAL:  No edema; No deformity  SKIN: Warm and dry NEUROLOGIC:  Alert and oriented x 3 PSYCHIATRIC:  Normal affect    Signed, Shirlee More, MD  03/05/2019 11:11 AM    [place]

## 2019-03-05 ENCOUNTER — Ambulatory Visit (INDEPENDENT_AMBULATORY_CARE_PROVIDER_SITE_OTHER): Payer: Medicare Other | Admitting: Cardiology

## 2019-03-05 ENCOUNTER — Encounter: Payer: Self-pay | Admitting: Cardiology

## 2019-03-05 ENCOUNTER — Other Ambulatory Visit: Payer: Self-pay

## 2019-03-05 VITALS — BP 102/64 | HR 61 | Ht 64.5 in | Wt 146.2 lb

## 2019-03-05 DIAGNOSIS — E7800 Pure hypercholesterolemia, unspecified: Secondary | ICD-10-CM | POA: Diagnosis not present

## 2019-03-05 DIAGNOSIS — Z79899 Other long term (current) drug therapy: Secondary | ICD-10-CM

## 2019-03-05 DIAGNOSIS — I480 Paroxysmal atrial fibrillation: Secondary | ICD-10-CM | POA: Diagnosis not present

## 2019-03-05 DIAGNOSIS — Z7901 Long term (current) use of anticoagulants: Secondary | ICD-10-CM | POA: Diagnosis not present

## 2019-03-05 NOTE — Patient Instructions (Signed)
Medication Instructions:  Your physician recommends that you continue on your current medications as directed. Please refer to the Current Medication list given to you today.  If you need a refill on your cardiac medications before your next appointment, please call your pharmacy.   Lab work: Your physician recommends that you return for lab work today: CMP, lipid profile, flecainide level.   If you have labs (blood work) drawn today and your tests are completely normal, you will receive your results only by: [name] MyChart Message (if you have MyChart) OR . A paper copy in the mail If you have any lab test that is abnormal or we need to change your treatment, we will call you to review the results.  Testing/Procedures: You had an EKG today.   Follow-Up: At [facility], you and your health needs are our priority.  As part of our continuing mission to provide you with exceptional heart care, we have created designated Provider Care Teams.  These Care Teams include your primary Cardiologist (physician) and Advanced Practice Providers (APPs -  Physician Assistants and Nurse Practitioners) who all work together to provide you with the care you need, when you need it. You will need a follow up appointment in 6 months.  Please call our office 2 months in advance to schedule this appointment.

## 2019-03-07 ENCOUNTER — Telehealth: Payer: Self-pay | Admitting: *Deleted

## 2019-03-07 LAB — COMPREHENSIVE METABOLIC PANEL
ALT: 14 IU/L (ref 0–32)
AST: 21 IU/L (ref 0–40)
Albumin/Globulin Ratio: 1.9 (ref 1.2–2.2)
Albumin: 4.5 g/dL (ref 3.8–4.8)
Alkaline Phosphatase: 85 IU/L (ref 39–117)
BUN/Creatinine Ratio: 21 (ref 12–28)
BUN: 14 mg/dL (ref 8–27)
Bilirubin Total: 0.3 mg/dL (ref 0.0–1.2)
CO2: 21 mmol/L (ref 20–29)
Calcium: 9.4 mg/dL (ref 8.7–10.3)
Chloride: 100 mmol/L (ref 96–106)
Creatinine, Ser: 0.67 mg/dL (ref 0.57–1.00)
GFR calc Af Amer: 104 mL/min/{1.73_m2} (ref >59)
GFR calc non Af Amer: 91 mL/min/{1.73_m2} (ref >59)
Globulin, Total: 2.4 g/dL (ref 1.5–4.5)
Glucose: 92 mg/dL (ref 65–99)
Potassium: 4 mmol/L (ref 3.5–5.2)
Sodium: 138 mmol/L (ref 134–144)
Total Protein: 6.9 g/dL (ref 6.0–8.5)

## 2019-03-07 LAB — LIPID PANEL
Chol/HDL Ratio: 4.4 ratio (ref 0.0–4.4)
Cholesterol, Total: 184 mg/dL (ref 100–199)
HDL: 42 mg/dL (ref >39)
LDL Calculated: 110 mg/dL — ABNORMAL HIGH (ref 0–99)
Triglycerides: 160 mg/dL — ABNORMAL HIGH (ref 0–149)
VLDL Cholesterol Cal: 32 mg/dL (ref 5–40)

## 2019-03-07 LAB — FLECAINIDE LEVEL: Flecainide: 0.36 ug/mL (ref 0.20–1.00)

## 2019-03-07 NOTE — Telephone Encounter (Signed)
-----   Message from Richardo Priest, MD sent at 03/07/2019  7:36 AM EDT ----- Normal or stable result

## 2019-03-07 NOTE — Telephone Encounter (Signed)
Telephone call to patient. Left message that labs were stable/normal and to call with any questions. 

## 2019-03-16 ENCOUNTER — Other Ambulatory Visit: Payer: Self-pay | Admitting: Cardiology

## 2019-03-31 ENCOUNTER — Other Ambulatory Visit: Payer: Self-pay

## 2019-03-31 MED ORDER — METOPROLOL TARTRATE 25 MG PO TABS: 25.0000 mg | ORAL_TABLET | Freq: Two times a day (BID) | ORAL | 3 refills | Status: DC

## 2019-05-01 ENCOUNTER — Other Ambulatory Visit: Payer: Self-pay | Admitting: Cardiology

## 2019-06-06 ENCOUNTER — Other Ambulatory Visit: Payer: Self-pay | Admitting: Cardiology

## 2019-06-06 NOTE — Telephone Encounter (Signed)
Metoprolol refill sent to [facility] on [address] in [place] 

## 2019-08-08 DIAGNOSIS — I10 Essential (primary) hypertension: Secondary | ICD-10-CM | POA: Diagnosis not present

## 2019-08-08 DIAGNOSIS — E0789 Other specified disorders of thyroid: Secondary | ICD-10-CM | POA: Diagnosis not present

## 2019-08-08 DIAGNOSIS — E7800 Pure hypercholesterolemia, unspecified: Secondary | ICD-10-CM | POA: Diagnosis not present

## 2019-08-08 DIAGNOSIS — Z780 Asymptomatic menopausal state: Secondary | ICD-10-CM | POA: Diagnosis not present

## 2019-08-08 DIAGNOSIS — Z79899 Other long term (current) drug therapy: Secondary | ICD-10-CM | POA: Diagnosis not present

## 2019-08-08 DIAGNOSIS — I4891 Unspecified atrial fibrillation: Secondary | ICD-10-CM | POA: Diagnosis not present

## 2019-08-08 DIAGNOSIS — Z9181 History of falling: Secondary | ICD-10-CM | POA: Diagnosis not present

## 2019-08-08 DIAGNOSIS — Z0000 Encounter for general adult medical examination without abnormal findings: Secondary | ICD-10-CM | POA: Diagnosis not present

## 2019-08-08 DIAGNOSIS — M810 Age-related osteoporosis without current pathological fracture: Secondary | ICD-10-CM | POA: Diagnosis not present

## 2019-08-16 NOTE — Progress Notes (Signed)
Cardiology Office Note:    Date:  08/18/2019   ID:  Tina [name], DOB 08-28-1950, MRN [id number]  PCP:  Serita Grammes, MD  Cardiologist:  Shirlee More, MD    Referring MD: Serita Grammes, MD    ASSESSMENT:    1. Syncope, unspecified syncope type   2. PAF (paroxysmal atrial fibrillation) ([place])   3. Chronic anticoagulation   4. High risk medication use   5. Hypercholesterolemia    PLAN:    In order of problems listed above:  1. Clinically she has sick sinus syndrome paroxysmal atrial fibrillation third syncopal episode in the last year would benefit from will be referred to implanted loop recorder and I suspect she has symptomatic bradycardia and require a pacemaker.  Spoke to the EP team today and they will make arrangements for device 2. She remains in sinus rhythm continue low-dose flecainide and current anticoagulant.  Her QRS interval is mildly prolonged I asked her to have a flecainide level done today 3. Continue flecainide check level 4. Continue her statin lipid profile 08/08/1999 cholesterol 210 LDL 126 HDL 42   Next appointment: 3 months with me   Medication Adjustments/Labs and Tests Ordered: Current medicines are reviewed at length with the patient today.  Concerns regarding medicines are outlined above.  No orders of the defined types were placed in this encounter.  No orders of the defined types were placed in this encounter.   Chief Complaint  Patient presents with  . Atrial Fibrillation  . Loss of Consciousness    History of Present Illness:    Tina [name] is a 69 y.o. female with a hx of SVT syncope and PAF last seen 03/05/2019.  She has been maintained on flecainide and anticoagulation she has the iPhone adapter and has had no breakthrough episodes of atrial fibrillation or bradycardia. Compliance with diet, lifestyle and medications: Yes  She is frustrated she has had another episode of syncope at home and unfortunate by the time she uses  her iPhone adapter the episodes over.  She is very weak with it nauseous does not recall all the events and afterwards she had erratic blood pressures up to 141/101 and her blood pressure cuff would not show her heart rate.  Her father had an implanted loop recorder she has had 3 of these episodes and documented SVT and PAF and I suspect she is having sinus node dysfunction and symptomatic bradycardia.  Advised her to undergo an implanted loop recorder risk options and benefits detailed the patient agrees and will be arranged through our EP service.  Outside these episodes she has no symptoms of palpitation tolerates flecainide well and tolerates anticoagulant without bleeding complication Past Medical History:  Diagnosis Date  . Abnormal mammogram of right breast 10/2016  . Aortic atherosclerosis ([place])   . Arthritis    right Thumb  . Chest pain in adult    Normal MPS 2016  . Complication of anesthesia    prolonged sedation  . Dental crowns present   . Dupuytren contracture    Bilateral hands, mild  . GERD (gastroesophageal reflux disease)   . Gestational diabetes    history of  . Grade I diastolic dysfunction 0000000   on ECHO  . Hepatitis    age 37 "infectious"  . History of blood transfusion 1982  . History of cardiomegaly 04/14/2017   Noted on CXR  . History of hepatitis as a child    "infectious", per pt.  . History of hiatal  hernia   . History of pleurisy   . History of pneumonia   . Hyperlipidemia   . Irregular heartbeat    since age 80, per pt.  . Limited joint range of motion    cervical spine - difficulty looking up for extended period  . MR (mitral regurgitation) 05/09/2017   Mild, noted on ECHO  . MVP (mitral valve prolapse)    Mild  . Orthostatic hypotension   . Osteoporosis   . PONV (postoperative nausea and vomiting)   . Pulmonary nodule 04/18/2017   5 mm left lower lobe nodule   . Seasonal allergies   . SUI (stress urinary incontinence, female)   . SVT  (supraventricular tachycardia) ([place])   . TR (tricuspid regurgitation) 05/09/2017   Mild, noted on ECHO  . Wears contact lenses   . Wears glasses     Past Surgical History:  Procedure Laterality Date  . ANTERIOR CERVICAL DECOMP/DISCECTOMY FUSION  02/03/2002   C5-6  . BLADDER SUSPENSION N/A 04/26/2018   Procedure: TRANSVAGINAL TAPE (TVT) PROCEDURE;  Surgeon: Bobbye Charleston, MD;  Location: [place];  Service: Gynecology;  Laterality: N/A;  . CESAREAN SECTION     x 3  . COLONOSCOPY  2013   several  . CYSTOCELE REPAIR N/A 04/26/2018   Procedure: ANTERIOR REPAIR (CYSTOCELE);  Surgeon: Bobbye Charleston, MD;  [facility];  Service: Gynecology;  Laterality: N/A;  . CYSTOSCOPY N/A 04/26/2018   Procedure: CYSTOSCOPY;  Surgeon: Bobbye Charleston, MD;  Location: [facility];  Service: Gynecology;  Laterality: N/A;  . INGUINAL HERNIA REPAIR Right   . LAPAROSCOPIC NISSEN FUNDOPLICATION  123456  . NISSEN FUNDOPLICATION  [id number]   redo  . RADIOACTIVE SEED GUIDED EXCISIONAL BREAST BIOPSY Right 11/08/2016   Procedure: RADIOACTIVE SEED GUIDED EXCISIONAL RIGHT BREAST BIOPSY;  Surgeon: Rolm Bookbinder, MD;  Location: [place];  Service: General;  Laterality: Right;  . TONSILLECTOMY  1977  . TUBAL LIGATION    . UPPER GI ENDOSCOPY     x2  . WISDOM TOOTH EXTRACTION      Current Medications: Current Meds  Medication Sig  . acetaminophen (TYLENOL) 325 MG tablet Take 325 mg by mouth as needed.  . Beclomethasone Dipropionate (QVAR IN) Inhale 1 puff into the lungs daily.  . cetirizine (ZYRTEC) 10 MG tablet TAKE 1 TABLET (10 MG TOTAL) BY MOUTH DAILY.  [name] Coenzyme Q10 (COQ-10 PO) Take 1 capsule by mouth daily.   [name] ELIQUIS 5 MG TABS tablet TAKE 1 TABLET BY MOUTH TWICE A DAY  . EPINEPHrine 0.3 mg/0.3 mL IJ SOAJ injection Inject into the muscle.  . famotidine (PEPCID) 20 MG tablet Take 20 mg by mouth 2 (two) times daily as  needed for heartburn or indigestion.  . flecainide (TAMBOCOR) 50 MG tablet TAKE 1 TABLET BY MOUTH TWICE A DAY  . fluticasone (FLONASE) 50 MCG/ACT nasal spray   . metoprolol tartrate (LOPRESSOR) 25 MG tablet TAKE 1 TABLET BY MOUTH TWICE A DAY  . montelukast (SINGULAIR) 10 MG tablet Take 10 mg by mouth at bedtime.  . Multiple Vitamins-Minerals (CENTRUM SILVER PO) Take 1 tablet by mouth daily.   . NON FORMULARY MIXED VIAL # 4 CONC. SLIT- sublingual drop for seasonal allergies  . Omega-3 Fatty Acids (FISH OIL PO) Take 1 capsule by mouth daily.   . pravastatin (PRAVACHOL) 20 MG tablet TAKE 1 TABLET BY MOUTH EVERY DAY IN THE EVENING  . QVAR REDIHALER 40 MCG/ACT inhaler   .  risedronate (ACTONEL) 35 MG tablet TAKE 1 TABLET BY MOUTH EVERY 7 DAYS WITH WATER ON EMPTY STOMACH, THEN NO FOOD, STAY UPRIGHT 30MINUTE     Allergies:   Codeine, Erythromycin, and Simvastatin   Social History   Socioeconomic History  . Marital status: Married    Spouse name: Not on file  . Number of children: Not on file  . Years of education: Not on file  . Highest education level: Not on file  Occupational History  . Not on file  Tobacco Use  . Smoking status: Never Smoker  . Smokeless tobacco: Never Used  Substance and Sexual Activity  . Alcohol use: No  . Drug use: No  . Sexual activity: Not on file  Other Topics Concern  . Not on file  Social History Narrative   Retired high school business teacher   Master's degree educatation   Married, lives with spouse -   Social Determinants of Health   Financial Resource Strain:   . Difficulty of Paying Living Expenses: Not on file  Food Insecurity:   . Worried About Charity fundraiser in the Last Year: Not on file  . Ran Out of Food in the Last Year: Not on file  Transportation Needs:   . Lack of Transportation (Medical): Not on file  . Lack of Transportation (Non-Medical): Not on file  Physical Activity:   . Days of Exercise per Week: Not on file  .  Minutes of Exercise per Session: Not on file  Stress:   . Feeling of Stress : Not on file  Social Connections:   . Frequency of Communication with Friends and Family: Not on file  . Frequency of Social Gatherings with Friends and Family: Not on file  . Attends Religious Services: Not on file  . Active Member of Clubs or Organizations: Not on file  . Attends Archivist Meetings: Not on file  . Marital Status: Not on file     Family History: The patient's family history includes Alzheimer's disease in her father and mother; Congestive Heart Failure in her father; Heart attack in her father, maternal grandfather, maternal grandmother, paternal grandfather, and paternal grandmother; Heart disease in her father; Heart failure in her father; Hyperlipidemia in her father; Prostate cancer in her father; Stroke in her paternal grandmother. ROS:   Please see the history of present illness.    All other systems reviewed and are negative.  EKGs/Labs/Other Studies Reviewed:    The following studies were reviewed today:  EKG:  EKG ordered today and personally reviewed.  The ekg ordered today demonstrates sinus rhythm right bundle branch block otherwise normal kardia strip 08/12/2019 personally reviewed, [facility] Recent Labs: 03/05/2019: ALT 14; BUN 14; Creatinine, Ser 0.67; Potassium 4.0; Sodium 138  Recent Lipid Panel    Component Value Date/Time   CHOL 184 03/05/2019 1141   TRIG 160 (H) 03/05/2019 1141   HDL 42 03/05/2019 1141   CHOLHDL 4.4 03/05/2019 1141   CHOLHDL 5 03/04/2015 0824   VLDL 20.8 03/04/2015 0824   LDLCALC 110 (H) 03/05/2019 1141    Physical Exam:    VS:  BP 116/74   Pulse 81   Ht 5' 4.5" (1.638 m)   Wt 151 lb 6.4 oz (68.7 kg)   SpO2 98%   BMI 25.59 kg/m     Wt Readings from Last 3 Encounters:  08/18/19 151 lb 6.4 oz (68.7 kg)  03/05/19 146 lb 3.2 oz (66.3 kg)  10/22/18 147 lb 8 oz (  66.9 kg)     GEN:  Well nourished, well developed in no acute  distress HEENT: Normal NECK: No JVD; No carotid bruits LYMPHATICS: No lymphadenopathy CARDIAC: RRR, no murmurs, rubs, gallops RESPIRATORY:  Clear to auscultation without rales, wheezing or rhonchi  ABDOMEN: Soft, non-tender, non-distended MUSCULOSKELETAL:  No edema; No deformity  SKIN: Warm and dry NEUROLOGIC:  Alert and oriented x 3 PSYCHIATRIC:  Normal affect    Signed, Shirlee More, MD  08/18/2019 8:59 AM    [place]

## 2019-08-18 ENCOUNTER — Other Ambulatory Visit: Payer: Self-pay

## 2019-08-18 ENCOUNTER — Ambulatory Visit (INDEPENDENT_AMBULATORY_CARE_PROVIDER_SITE_OTHER): Payer: Medicare PPO | Admitting: Cardiology

## 2019-08-18 ENCOUNTER — Encounter: Payer: Self-pay | Admitting: Cardiology

## 2019-08-18 VITALS — BP 116/74 | HR 81 | Ht 64.5 in | Wt 151.4 lb

## 2019-08-18 DIAGNOSIS — Z79899 Other long term (current) drug therapy: Secondary | ICD-10-CM

## 2019-08-18 DIAGNOSIS — E7800 Pure hypercholesterolemia, unspecified: Secondary | ICD-10-CM | POA: Diagnosis not present

## 2019-08-18 DIAGNOSIS — Z7901 Long term (current) use of anticoagulants: Secondary | ICD-10-CM

## 2019-08-18 DIAGNOSIS — I480 Paroxysmal atrial fibrillation: Secondary | ICD-10-CM

## 2019-08-18 DIAGNOSIS — R55 Syncope and collapse: Secondary | ICD-10-CM

## 2019-08-18 NOTE — Patient Instructions (Signed)
Medication Instructions:  Your physician recommends that you continue on your current medications as directed. Please refer to the Current Medication list given to you today.  *If you need a refill on your cardiac medications before your next appointment, please call your pharmacy*  Lab Work: Your physician recommends that you have a flecainide level drawn today  If you have labs (blood work) drawn today and your tests are completely normal, you will receive your results only by: [name] MyChart Message (if you have MyChart) OR . A paper copy in the mail If you have any lab test that is abnormal or we need to change your treatment, we will call you to review the results.  Testing/Procedures: You had an EKG performed today  Your physician has recommended that you have an EP Study. This test is used to assess serious arrhythmias (irregular heartbeats). During an Electro-physiology Study (EPS), a thin, flexible wire is passed through a vein in your groin (upper thigh) or neck up to the heart. The wire records the heart's electrical signals. Your doctor uses the wire to electrically stimulate your heart and trigger an arrhythmic. This allows the doctor to see whether an antiarrhythmia medicine can help manage the problem or if further procedures are necessary (i.e., ablation/ICD). Radiofrequency ablation, a procedure used to fix some types of arrthythmia, may be done during an EPS. This is done in the hospital and often requires an overnight stay. Please see the instruction sheet given to your today for more information.    Follow-Up: At [facility], you and your health needs are our priority.  As part of our continuing mission to provide you with exceptional heart care, we have created designated Provider Care Teams.  These Care Teams include your primary Cardiologist (physician) and Advanced Practice Providers (APPs -  Physician Assistants and Nurse Practitioners) who all work together to provide you  with the care you need, when you need it.  Your next appointment:   3 month(s)  The format for your next appointment:   In Person  Provider:   Shirlee More, MD  Other Instructions  Electrophysiology Study An electrophysiology (EP) study is a heart test to check how well the heart's electrical conduction system is working. The electrical conduction system uses electrical signals to make the heart beat. In this study, thin, flexible tubes (catheters) are placed in a large vein in your groin, arm, neck, or chest. A person may need this test if he or she has:  Dizziness or fainting.  An abnormal heart rhythm (arrhythmia), such as: ? A fast heartbeat (tachycardia). ? A slow heartbeat (bradycardia). ? An irregular heartbeat, such as atrial fibrillation. Tell a health care provider about:  Any allergies you have.  All medicines you are taking, including vitamins, herbs, eye drops, creams, and over-the-counter medicines.  Any problems you or family members have had with anesthetic medicines.  Any blood disorders you have.  Any surgeries you have had.  Any medical conditions you have or have had.  Whether you are pregnant or may be pregnant. What are the risks? Generally, this is a safe procedure. However, problems may occur, including:  Tachycardia that does not go away.  Bleeding or bruising around the insertion site.  Infection.  Temporary or permanent problems of the heart rhythm.  Temporary changes in blood pressure.  Puncture (perforation) of the heart wall or a blood vessel. This can cause bleeding and pressure between the heart and the sac that surrounds it (cardiac tamponade).  Severe heart problems, such as: ? Cardiac arrest. This is when the heart stops beating. ? Life-threatening arrhythmia.  Allergic reactions to medicines or dyes.  Damage to nearby structures or organs. What happens before the procedure? Staying hydrated Follow instructions from your  health care provider about hydration, which may include:  Up to 2 hours before the procedure - you may continue to drink clear liquids, such as water, clear fruit juice, black coffee, and plain tea.  Eating and drinking restrictions Follow instructions from your health care provider about eating and drinking, which may include:  8 hours before the procedure - stop eating heavy meals or foods, such as meat, fried foods, or fatty foods.  6 hours before the procedure - stop eating light meals or foods, such as toast or cereal.  6 hours before the procedure - stop drinking milk or drinks that contain milk.  2 hours before the procedure - stop drinking clear liquids. Medicines Ask your health care provider about:  Changing or stopping your regular medicines. This is especially important if you are taking diabetes medicines or blood thinners.  Taking medicines such as aspirin and ibuprofen. These medicines can thin your blood. Do not take these medicines unless your health care provider tells you to take them.  Taking over-the-counter medicines, vitamins, herbs, and supplements. Surgery safety Ask your health care provider:  How your surgery site will be marked.  What steps will be taken to help prevent infection. These may include: ? Removing hair at the surgery site. ? Washing skin with a germ-killing soap. ? Taking antibiotic medicine. General instructions  Do not use any products that contain nicotine or tobacco for at least 4 weeks before the procedure. These products include cigarettes, e-cigarettes, and chewing tobacco. If you need help quitting, ask your health care provider.  Plan to have someone take you home from the hospital or clinic.  If you will be going home right after the procedure, plan to have someone with you for 24 hours. What happens during the procedure?   An IV will be inserted into one of your veins.  You will be given one or more of the following: ? A  medicine to help you relax (sedative). ? A medicine to numb the area (local anesthetic). ? A medicine to make you fall asleep (general anesthetic).  Catheters with an electrode tip will be inserted into a large vein. These electrode tips can measure the heart's electrical activity. They can also use electrical signals to change the heart rhythm.  The catheters will be guided to the heart using a type of X-ray machine (fluoroscopy). Once the catheters are in the heart, they will evaluate the electrical activity of your heart.  If you are awake during the EP study, you may feel dizzy or light-headed. Your heart rate may temporarily increase, or you may feel your heart beating hard. Tell your health care provider if you experience these things during the EP study: ? You feel dizzy or nauseous. ? You have chest pain or pressure.  The catheters will be removed.  Firm pressure will be applied to the insertion site to prevent bleeding.  A bandage (dressing) may be applied over the insertion site. The procedure may vary among health care providers and hospitals. What happens after the procedure?   Your blood pressure, heart rate, breathing rate, and blood oxygen level will be monitored until you leave the hospital or clinic.  If you were given a sedative during the procedure,  it can affect you for several hours. Do not drive or operate machinery until your health care provider says that it is safe.  You will need to lie flat for a few hours or as told by your health care provider. Keep your legs straight. Do not bend or cross your legs.  It is up to you to get the results of your procedure. Ask your health care provider, or the department that is doing the procedure, when your results will be ready. Summary  An electrophysiology (EP) study is a heart test to check how well the heart's electrical system is working.  This is generally a safe procedure. However, heart problems, infection,  puncture of the heart, or bleeding around the insertion site may occur.  Follow your health care provider's instructions about eating and drinking restrictions and stopping or changing medicines before the procedure.  After the procedure, you will need to lie flat for a few hours or as told by your health care provider. This information is not intended to replace advice given to you by your health care provider. Make sure you discuss any questions you have with your health care provider. Document Revised: 03/27/2019 Document Reviewed: 03/27/2019 Elsevier Patient Education  [employer].

## 2019-08-20 LAB — FLECAINIDE LEVEL: Flecainide: 0.35 ug/mL (ref 0.20–1.00)

## 2019-08-31 ENCOUNTER — Ambulatory Visit: Payer: Medicare PPO

## 2019-09-06 ENCOUNTER — Other Ambulatory Visit: Payer: Self-pay | Admitting: Cardiology

## 2019-09-15 DIAGNOSIS — H579 Unspecified disorder of eye and adnexa: Secondary | ICD-10-CM

## 2019-09-15 DIAGNOSIS — Z9109 Other allergy status, other than to drugs and biological substances: Secondary | ICD-10-CM | POA: Diagnosis not present

## 2019-09-15 DIAGNOSIS — R6889 Other general symptoms and signs: Secondary | ICD-10-CM | POA: Diagnosis not present

## 2019-09-15 DIAGNOSIS — E041 Nontoxic single thyroid nodule: Secondary | ICD-10-CM | POA: Diagnosis not present

## 2019-09-15 HISTORY — DX: Unspecified disorder of eye and adnexa: H57.9

## 2019-09-15 HISTORY — DX: Other general symptoms and signs: R68.89

## 2019-09-16 DIAGNOSIS — E042 Nontoxic multinodular goiter: Secondary | ICD-10-CM | POA: Diagnosis not present

## 2019-09-16 DIAGNOSIS — E041 Nontoxic single thyroid nodule: Secondary | ICD-10-CM | POA: Diagnosis not present

## 2019-09-17 DIAGNOSIS — Z9229 Personal history of other drug therapy: Secondary | ICD-10-CM | POA: Diagnosis not present

## 2019-09-17 DIAGNOSIS — E0789 Other specified disorders of thyroid: Secondary | ICD-10-CM | POA: Diagnosis not present

## 2019-09-17 DIAGNOSIS — N3001 Acute cystitis with hematuria: Secondary | ICD-10-CM | POA: Diagnosis not present

## 2019-09-17 DIAGNOSIS — Z6826 Body mass index (BMI) 26.0-26.9, adult: Secondary | ICD-10-CM | POA: Diagnosis not present

## 2019-10-01 ENCOUNTER — Telehealth: Payer: Self-pay | Admitting: *Deleted

## 2019-10-01 NOTE — Telephone Encounter (Signed)
Pt scheduled 4/16 for consult to discuss ILR implant.\ Syncope/afib mngt. Will sent to precert for updated authorization (previous auth expired 3/1).  Will forward to device clinic for their FYI and to arrange MDT for possible implant.

## 2019-10-02 ENCOUNTER — Other Ambulatory Visit: Payer: Self-pay | Admitting: Cardiology

## 2019-10-08 DIAGNOSIS — N3001 Acute cystitis with hematuria: Secondary | ICD-10-CM | POA: Diagnosis not present

## 2019-10-08 DIAGNOSIS — M810 Age-related osteoporosis without current pathological fracture: Secondary | ICD-10-CM | POA: Diagnosis not present

## 2019-10-08 DIAGNOSIS — Z6826 Body mass index (BMI) 26.0-26.9, adult: Secondary | ICD-10-CM | POA: Diagnosis not present

## 2019-10-08 DIAGNOSIS — I4891 Unspecified atrial fibrillation: Secondary | ICD-10-CM | POA: Diagnosis not present

## 2019-10-09 ENCOUNTER — Other Ambulatory Visit: Payer: Self-pay | Admitting: Cardiology

## 2019-10-13 DIAGNOSIS — L821 Other seborrheic keratosis: Secondary | ICD-10-CM | POA: Diagnosis not present

## 2019-10-13 DIAGNOSIS — D1801 Hemangioma of skin and subcutaneous tissue: Secondary | ICD-10-CM | POA: Diagnosis not present

## 2019-10-13 DIAGNOSIS — D225 Melanocytic nevi of trunk: Secondary | ICD-10-CM | POA: Diagnosis not present

## 2019-10-13 DIAGNOSIS — D2239 Melanocytic nevi of other parts of face: Secondary | ICD-10-CM | POA: Diagnosis not present

## 2019-10-13 DIAGNOSIS — L820 Inflamed seborrheic keratosis: Secondary | ICD-10-CM | POA: Diagnosis not present

## 2019-10-27 ENCOUNTER — Ambulatory Visit: Payer: Medicare PPO | Admitting: Cardiology

## 2019-10-27 ENCOUNTER — Other Ambulatory Visit: Payer: Self-pay

## 2019-10-27 ENCOUNTER — Encounter: Payer: Self-pay | Admitting: Cardiology

## 2019-10-27 VITALS — BP 126/78 | HR 52 | Ht 63.0 in | Wt 153.0 lb

## 2019-10-27 DIAGNOSIS — R55 Syncope and collapse: Secondary | ICD-10-CM | POA: Diagnosis not present

## 2019-10-27 NOTE — Progress Notes (Signed)
Electrophysiology Office Note   Date:  10/27/2019   ID:  Tina [name], DOB 10-Oct-1950, MRN [id number]  PCP:  Serita Grammes, MD  Cardiologist: Bettina Gavia Primary Electrophysiologist:  Aryon Nham Meredith Leeds, MD    Chief Complaint: Atrial fibrillation, syncope   History of Present Illness: Tina [name] is a 69 y.o. female who is being seen today for the evaluation of atrial fibrillation, syncope at the request of Shirlee More. Presenting today for electrophysiology evaluation.  She has a history significant for atrial fibrillation, hyperlipidemia, SVT.  She has had multiple episodes of syncope.  She feels weak and nauseous prior to her episodes.  Today, she denies symptoms of palpitations, chest pain, shortness of breath, orthopnea, PND, lower extremity edema, claudication, dizziness, presyncope, syncope, bleeding, or neurologic sequela. The patient is tolerating medications without difficulties.  She is on both flecainide and Eliquis.  She says that she has some episodes of atrial fibrillation but otherwise feels well.  She has had 2 episodes of syncope.  One was at a travel clinic prior to a trip to [place].  She says that she had to stay in the clinic for 30 extra minutes.  They apparently had trouble waking her up.  Her second episode occurred a few months ago.  She sat down on the couch, got hot sweaty and passed out.  When she woke up she felt improved.  She did not have palpitations or chest pain around that time.   Past Medical History:  Diagnosis Date  . Abnormal mammogram of right breast 10/2016  . Aortic atherosclerosis ([place])   . Arthritis    right Thumb  . Chest pain in adult    Normal MPS 2016  . Complication of anesthesia    prolonged sedation  . Dental crowns present   . Dupuytren contracture    Bilateral hands, mild  . GERD (gastroesophageal reflux disease)   . Gestational diabetes    history of  . Grade I diastolic dysfunction 0000000   on ECHO  . 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 History of blood transfusion 1982  . History of cardiomegaly 04/14/2017   Noted on CXR  . History of hepatitis as a child    "infectious", per pt.  . History of hiatal hernia   . History of pleurisy   . History of pneumonia   . Hyperlipidemia   . Irregular heartbeat    since age 69, per pt.  . Limited joint range of motion    cervical spine - difficulty looking up for extended period  . MR (mitral regurgitation) 05/09/2017   Mild, noted on ECHO  . MVP (mitral valve prolapse)    Mild  . Orthostatic hypotension   . Osteoporosis   . PONV (postoperative nausea and vomiting)   . Pulmonary nodule 04/18/2017   5 mm left lower lobe nodule   . Seasonal allergies   . SUI (stress urinary incontinence, female)   . SVT (supraventricular tachycardia) ([place])   . TR (tricuspid regurgitation) 05/09/2017   Mild, noted on ECHO  . Wears contact lenses   . Wears glasses    Past Surgical History:  Procedure Laterality Date  . ANTERIOR CERVICAL DECOMP/DISCECTOMY FUSION  02/03/2002   C5-6  . BLADDER SUSPENSION N/A 04/26/2018   Procedure: TRANSVAGINAL TAPE (TVT) PROCEDURE;  Surgeon: Bobbye Charleston, MD;  Location: [facility];  Service: Gynecology;  Laterality: N/A;  . CESAREAN SECTION     x 3  .  COLONOSCOPY  2013   several  . CYSTOCELE REPAIR N/A 04/26/2018   Procedure: ANTERIOR REPAIR (CYSTOCELE);  Surgeon: Bobbye Charleston, MD;  Location: [facility];  Service: Gynecology;  Laterality: N/A;  . CYSTOSCOPY N/A 04/26/2018   Procedure: CYSTOSCOPY;  Surgeon: Bobbye Charleston, MD;  Location[facility];  Service: Gynecology;  Laterality: N/A;  . INGUINAL HERNIA REPAIR Right   . LAPAROSCOPIC NISSEN FUNDOPLICATION  123456  . NISSEN FUNDOPLICATION  [id number]   redo  . RADIOACTIVE SEED GUIDED EXCISIONAL BREAST BIOPSY Right 11/08/2016   Procedure: RADIOACTIVE SEED GUIDED EXCISIONAL RIGHT BREAST BIOPSY;  Surgeon: Rolm Bookbinder, MD;  Location: [facility];  Service: General;  Laterality: Right;  . TONSILLECTOMY  1977  . TUBAL LIGATION    . UPPER GI ENDOSCOPY     x2  . WISDOM TOOTH EXTRACTION       Current Outpatient Medications  Medication Sig Dispense Refill  . acetaminophen (TYLENOL) 325 MG tablet Take 325 mg by mouth as needed.    . Beclomethasone Dipropionate (QVAR IN) Inhale 1 puff into the lungs daily.    . cetirizine (ZYRTEC) 10 MG tablet TAKE 1 TABLET (10 MG TOTAL) BY MOUTH DAILY. 30 tablet 11  . Coenzyme Q10 (COQ-10 PO) Take 1 capsule by mouth daily.     [name] ELIQUIS 5 MG TABS tablet TAKE 1 TABLET BY MOUTH TWICE A DAY 60 tablet 5  . EPINEPHrine 0.3 mg/0.3 mL IJ SOAJ injection Inject into the muscle.    . famotidine (PEPCID) 20 MG tablet Take 20 mg by mouth 2 (two) times daily as needed for heartburn or indigestion.    . flecainide (TAMBOCOR) 50 MG tablet TAKE 1 TABLET BY MOUTH TWICE A DAY 180 tablet 2  . fluticasone (FLONASE) 50 MCG/ACT nasal spray     . metoprolol tartrate (LOPRESSOR) 25 MG tablet TAKE 1 TABLET BY MOUTH TWICE A DAY 180 tablet 1  . montelukast (SINGULAIR) 10 MG tablet Take 10 mg by mouth at bedtime.    . Multiple Vitamins-Minerals (CENTRUM SILVER PO) Take 1 tablet by mouth daily.     . NON FORMULARY MIXED VIAL # 4 CONC. SLIT- sublingual drop for seasonal allergies    . Omega-3 Fatty Acids (FISH OIL PO) Take 1 capsule by mouth daily.     . pravastatin (PRAVACHOL) 20 MG tablet TAKE 1 TABLET BY MOUTH EVERY DAY IN THE EVENING 90 tablet 2  . QVAR REDIHALER 40 MCG/ACT inhaler     . risedronate (ACTONEL) 35 MG tablet TAKE 1 TABLET BY MOUTH EVERY 7 DAYS WITH WATER ON EMPTY STOMACH, THEN NO FOOD, STAY UPRIGHT 30MINUTE 4 tablet 11   No current facility-administered medications for this visit.    Allergies:   Codeine, Erythromycin, and Simvastatin   Social History:  The patient  reports that she has never smoked. She has never used smokeless tobacco. She reports that she  does not drink alcohol or use drugs.   Family History:  The patient's family history includes Alzheimer's disease in her father and mother; Congestive Heart Failure in her father; Heart attack in her father, maternal grandfather, maternal grandmother, paternal grandfather, and paternal grandmother; Heart disease in her father; Heart failure in her father; Hyperlipidemia in her father; Prostate cancer in her father; Stroke in her paternal grandmother.    ROS:  Please see the history of present illness.   Otherwise, review of systems is positive for none.   All other systems are reviewed and negative.  PHYSICAL EXAM: VS:  There were no vitals taken for this visit. , BMI There is no height or weight on file to calculate BMI. GEN: Well nourished, well developed, in no acute distress  HEENT: normal  Neck: no JVD, carotid bruits, or masses Cardiac: RRR; no murmurs, rubs, or gallops,no edema  Respiratory:  clear to auscultation bilaterally, normal work of breathing GI: soft, nontender, nondistended, + BS MS: no deformity or atrophy  Skin: warm and dry Neuro:  Strength and sensation are intact Psych: euthymic mood, full affect  EKG:  EKG is not ordered today. Personal review of the ekg ordered 08/18/19 shows SR, RBBB  Recent Labs: 03/05/2019: ALT 14; BUN 14; Creatinine, Ser 0.67; Potassium 4.0; Sodium 138    Lipid Panel     Component Value Date/Time   CHOL 184 03/05/2019 1141   TRIG 160 (H) 03/05/2019 1141   HDL 42 03/05/2019 1141   CHOLHDL 4.4 03/05/2019 1141   CHOLHDL 5 03/04/2015 0824   VLDL 20.8 03/04/2015 0824   LDLCALC 110 (H) 03/05/2019 1141     Wt Readings from Last 3 Encounters:  08/18/19 151 lb 6.4 oz (68.7 kg)  03/05/19 146 lb 3.2 oz (66.3 kg)  10/22/18 147 lb 8 oz (66.9 kg)      Other studies Reviewed: Additional studies/ records that were reviewed today include: TTE 05/09/17  Review of the above records today demonstrates:  - Left ventricle: The cavity size was  normal. Systolic function was  normal. The estimated ejection fraction was in the range of 50%  to 55%. Wall motion was normal; there were no regional wall  motion abnormalities. Doppler parameters are consistent with  abnormal left ventricular relaxation (grade 1 diastolic  dysfunction).  - Mitral valve: Mild prolapse of the anterior leaflet. There was  mild regurgitation.  Monitor 09/03/2018 personally reviewed The predominant rhythm is sinus with minimum average and maximum heart rates of 59, 76 and 127 bpm.  Atrial fibrillation occurred 4% burden average rate 89 bpm minimum maximum 65 and 149 bpm.  The atrial fibrillation was symptomatic with palpitation. There were no pauses of 3 seconds or greater and there was no AV block or sinus node exit block. Rare ventricular ectopy was seen less than 1% there was a strip present as ventricular tachycardia which was a brief run of atrial premature beats. Rare supraventricular ectopy is seen along with brief runs of APCs and a 4% burden of atrial fibrillation or atypical atrial flutter. Triggered events were present predominantly with supraventricular and ventricular ectopy.   ASSESSMENT AND PLAN:  1.  Paroxysmal atrial fibrillation: Has a 4% burden on monitor.  She is currently on Eliquis and flecainide with a CHA2DS2-VASc of 2.  She has a right bundle branch block on her flecainide and thus the dose cannot be increased.  If she needs further rhythm control, alternative medications or ablation may be a reasonable option.  2.  Hyperlipidemia: Continue statin per primary cardiology  3.  Syncope: Unclear as to the cause.  She has had one episode around the time of vaccination which could have been vagal.  Her second episode does not sound vagal to me.  No obvious cause on cardiac monitoring.  We [place] plan for Linq monitor implant.  Risks and benefits were discussed which include bleeding and infection.  She understands these risks and is  agreed to the procedure.  Case discussed with primary cardiology  Current medicines are reviewed at length with the patient today.  The patient does not have concerns regarding her medicines.  The following changes were made today:  none  Labs/ tests ordered today include:  No orders of the defined types were placed in this encounter.    Disposition:   FU with Ouita Nish 6 months  Signed, Kenyata Guess Meredith Leeds, MD  10/27/2019 8:34 AM     [place] [address] [place] [address] [phone] (office) [phone] (fax)  SURGEON:  Allegra Lai, MD     PREPROCEDURE DIAGNOSIS:  Syncope    POSTPROCEDURE DIAGNOSIS:  Syncope     PROCEDURES:   1. Implantable loop recorder implantation    INTRODUCTION:  ZARRAH TESKA is a 69 y.o. female with a history of syncope who presents today for implantable loop implantation.  The patient has had syncope without a cause identified.   she has worn telemetry previously during which she did not have arrhythmias.  There is significant concern for possible arrhythmia as the cause for the syncope. The patient therefore presents today for implantable loop implantation.     DESCRIPTION OF PROCEDURE:  Informed written consent was obtained, and the patient was brought to the electrophysiology lab in a fasting state.  The patient required no sedation for the procedure today.  Mapping over the patient's chest was performed by the EP lab staff to identify the area where electrograms were most prominent for ILR recording.  This area was found to be the left parasternal region over the 3rd-4th intercostal space. The patients left chest was therefore prepped and draped in the usual sterile fashion by the EP lab staff. The skin overlying the left parasternal region was infiltrated with lidocaine for local analgesia.  A 0.5-cm incision was made over the left parasternal region over the 3rd intercostal space.  A subcutaneous ILR  pocket was fashioned using a combination of sharp and blunt dissection.  A Medtronic Reveal [place] model [id number] implantable loop recorder was then placed into the pocket  R waves were very prominent and measured 0.68mV.  Steri- Strips and a sterile dressing were then applied.  There were no early apparent complications.     CONCLUSIONS:   1. Successful implantation of a Medtronic Reveal LINQ implantable loop recorder for syncope  2. No early apparent complications.   Hermelinda Diegel Meredith Leeds, MD 10/27/2019 9:17 AM

## 2019-10-27 NOTE — Patient Instructions (Addendum)
Medication Instructions:  Your physician recommends that you continue on your current medications as directed. Please refer to the Current Medication list given to you today.  *If you need a refill on your cardiac medications before your next appointment, please call your pharmacy*   Lab Work: None ordered If you have labs (blood work) drawn today and your tests are completely normal, you will receive your results only by: [name] MyChart Message (if you have MyChart) OR . A paper copy in the mail If you have any lab test that is abnormal or we need to change your treatment, we will call you to review the results.   Testing/Procedures: You had a loop recorder implanted today in the office.  Information on loop recorder is located below under other instructions.   Follow-Up: Your physician recommends that you schedule a follow-up appointment in: 7-10 days for a wound check.  The office will call to arrange this appointment.  At [facility], you and your health needs are our priority.  As part of our continuing mission to provide you with exceptional heart care, we have created designated Provider Care Teams.  These Care Teams include your primary Cardiologist (physician) and Advanced Practice Providers (APPs -  Physician Assistants and Nurse Practitioners) who all work together to provide you with the care you need, when you need it.  We recommend signing up for the patient portal called "MyChart".  Sign up information is provided on this After Visit Summary.  MyChart is used to connect with patients for Virtual Visits (Telemedicine).  Patients are able to view lab/test results, encounter notes, upcoming appointments, etc.  Non-urgent messages can be sent to your provider as well.   To learn more about what you can do with MyChart, go to [email or web address].    Your next appointment:   6 month(s)  The format for your next appointment:   In Person  Provider:   Allegra Lai,  MD   Thank you for choosing [facility]!!   Tina Curet, RN [phone]    Other Instructions   Implantable Loop Recorder Placement  An implantable loop recorder is a small electronic device that is placed under the skin of your chest. It is about the size of an AA ("double A") battery. The device records the electrical activity of your heart over a long period of time. Your health care provider can download these recordings to monitor your heart. You may need an implantable loop recorder if you have periods of abnormal heart activity (arrhythmias) or unexplained fainting (syncope). The recorder can be left in place for 1 year or longer. Tell a health care provider about:  Any allergies you have.  All medicines you are taking, including vitamins, herbs, eye drops, creams, and over-the-counter medicines.  Any problems you or family members have had with anesthetic medicines.  Any blood disorders you have.  Any surgeries you have had.  Any medical conditions you have.  Whether you are pregnant or may be pregnant. What are the risks? Generally, this is a safe procedure. However, problems may occur, including:  Infection.  Bleeding.  Allergic reactions to anesthetic medicines.  Damage to nerves or blood vessels.  Failure of the device to work. This could require another surgery to replace it. What happens before the procedure?   You may have a physical exam, blood tests, and imaging tests of your heart, such as a chest X-ray.  Follow instructions from your health care provider about eating or drinking restrictions.  Ask your health care provider about: ? Changing or stopping your regular medicines. This is especially important if you are taking diabetes medicines or blood thinners. ? Taking medicines such as aspirin and ibuprofen. These medicines can thin your blood. Do not take these medicines unless your health care provider tells you to take them. ? Taking  over-the-counter medicines, vitamins, herbs, and supplements.  Ask your health care provider how your surgical site will be marked or identified.  Ask your health care provider what steps will be taken to help prevent infection. These may include: ? Removing hair at the surgery site. ? Washing skin with a germ-killing soap.  Plan to have someone take you home from the hospital or clinic.  Plan to have a responsible adult care for you for at least 24 hours after you leave the hospital or clinic. This is important.  Do not use any products that contain nicotine or tobacco, such as cigarettes and e-cigarettes. If you need help quitting, ask your health care provider. What happens during the procedure?  An IV will be inserted into one of your veins.  You may be given one or more of the following: ? A medicine to help you relax (sedative). ? A medicine to numb the area (local anesthetic).  A small incision will be made on the left side of your upper chest.  A pocket will be created under your skin.  The device will be placed in the pocket.  The incision will be closed with stitches (sutures) or adhesive strips.  A bandage (dressing) will be placed over the incision. The procedure may vary among health care providers and hospitals. What happens after the procedure?  Your blood pressure, heart rate, breathing rate, and blood oxygen level will be monitored until you leave the hospital or clinic.  You may be able to go home on the day of your surgery. Before you go home: ? Your health care provider will program your recorder. ? You will learn how to trigger your device with a handheld activator. ? You will learn how to send recordings to your health care provider. ? You will get an ID card for your device, and you will be told when to use it.  Do not drive for 24 hours if you were given a sedative during your procedure. Summary  An implantable loop recorder is a small electronic  device that is placed under the skin of your chest to monitor your heart over a long period of time.  The recorder can be left in place for 1 year or longer.  Plan to have someone take you home from the hospital or clinic. This information is not intended to replace advice given to you by your health care provider. Make sure you discuss any questions you have with your health care provider. Document Revised: 09/06/2017 Document Reviewed: 08/18/2017 Elsevier Patient Education  2020 [employer].

## 2019-11-03 ENCOUNTER — Institutional Professional Consult (permissible substitution): Payer: Medicare PPO | Admitting: Cardiology

## 2019-11-06 ENCOUNTER — Ambulatory Visit (INDEPENDENT_AMBULATORY_CARE_PROVIDER_SITE_OTHER): Payer: Medicare PPO | Admitting: Student

## 2019-11-06 ENCOUNTER — Other Ambulatory Visit: Payer: Self-pay

## 2019-11-06 DIAGNOSIS — R55 Syncope and collapse: Secondary | ICD-10-CM

## 2019-11-06 LAB — CUP PACEART INCLINIC DEVICE CHECK
Date Time Interrogation Session: 20210422115210
Implantable Pulse Generator Implant Date: 20210412

## 2019-11-06 NOTE — Progress Notes (Signed)
ILR wound check in clinic. Steri strips removed. Wound well healed. Home monitor transmitting nightly. No episodes. Questions answered.   Pt walked through sending symptom episode via APP. R waves 0.48 - 0.51 mV

## 2019-11-17 ENCOUNTER — Other Ambulatory Visit: Payer: Self-pay

## 2019-11-17 ENCOUNTER — Encounter: Payer: Self-pay | Admitting: Cardiology

## 2019-11-17 ENCOUNTER — Ambulatory Visit: Payer: Medicare PPO | Admitting: Cardiology

## 2019-11-17 VITALS — BP 122/76 | HR 58 | Temp 97.1°F | Ht 64.0 in | Wt 151.4 lb

## 2019-11-17 DIAGNOSIS — I480 Paroxysmal atrial fibrillation: Secondary | ICD-10-CM | POA: Diagnosis not present

## 2019-11-17 DIAGNOSIS — R55 Syncope and collapse: Secondary | ICD-10-CM | POA: Diagnosis not present

## 2019-11-17 DIAGNOSIS — Z7901 Long term (current) use of anticoagulants: Secondary | ICD-10-CM | POA: Diagnosis not present

## 2019-11-17 DIAGNOSIS — Z79899 Other long term (current) drug therapy: Secondary | ICD-10-CM

## 2019-11-17 MED ORDER — CELECOXIB 100 MG PO CAPS: 100.0000 mg | ORAL_CAPSULE | Freq: Two times a day (BID) | ORAL | 3 refills | Status: DC

## 2019-11-17 NOTE — Patient Instructions (Signed)
Medication Instructions:  Your physician has recommended you make the following change in your medication:  START: Celebrex 100 mg take one tablet by mouth twice daily.  *If you need a refill on your cardiac medications before your next appointment, please call your pharmacy*   Lab Work: None If you have labs (blood work) drawn today and your tests are completely normal, you will receive your results only by: [name] MyChart Message (if you have MyChart) OR . A paper copy in the mail If you have any lab test that is abnormal or we need to change your treatment, we will call you to review the results.   Testing/Procedures: None   Follow-Up: At [facility], you and your health needs are our priority.  As part of our continuing mission to provide you with exceptional heart care, we have created designated Provider Care Teams.  These Care Teams include your primary Cardiologist (physician) and Advanced Practice Providers (APPs -  Physician Assistants and Nurse Practitioners) who all work together to provide you with the care you need, when you need it.  We recommend signing up for the patient portal called "MyChart".  Sign up information is provided on this After Visit Summary.  MyChart is used to connect with patients for Virtual Visits (Telemedicine).  Patients are able to view lab/test results, encounter notes, upcoming appointments, etc.  Non-urgent messages can be sent to your provider as well.   To learn more about what you can do with MyChart, go to [email or web address].    Your next appointment:   6 month(s)  The format for your next appointment:   In Person  Provider:   Shirlee More, MD   Other Instructions

## 2019-11-17 NOTE — Progress Notes (Signed)
Cardiology Office Note:    Date:  11/17/2019   ID:  Tina [name], DOB 05-Jul-1951, MRN [id number]  PCP:  Tina Grammes, MD  Cardiologist:  Tina More, MD    Referring MD: Tina Grammes, MD    ASSESSMENT:    1. PAF (paroxysmal atrial fibrillation) ([place])   2. High risk medication use   3. Chronic anticoagulation   4. Syncope, unspecified syncope type    PLAN:    In order of problems listed above:  1. Stable maintaining sinus rhythm continue beta-blocker low-dose flecainide and anticoagulant. 2. We will continue following her loop recorder practice if we document tachybradycardia syndrome does not need a pacemaker PVI. 3. Renew Celebrex which she takes intermittently and has had recurrent pleuritis in the past   Next appointment: 6 months   Medication Adjustments/Labs and Tests Ordered: Current medicines are reviewed at length with the patient today.  Concerns regarding medicines are outlined above.  No orders of the defined types were placed in this encounter.  No orders of the defined types were placed in this encounter.   No chief complaint on file.   History of Present Illness:    Tina [name] is a 69 y.o. female with a hx of  SVT syncope and PAF last seen 03/05/2019.  She has been maintained on flecainide and anticoagulation.  last seen 08/18/2019.  She had an implanted loop recorder 11/05/2019. Compliance with diet, lifestyle and medications: Yes  She requests refill Celebrex intermittently she gets recurrent pleuritic discomfort takes it for a few days with relief.  She has been evaluated for this in the past.  She has had no further episodes of palpitation or syncope and she has her loop recorder associated with her cell phone.  No chest pain palpitations syncope.  Tolerates her anticoagulant without any bleeding complication remains on low-dose flecainide.  Her EKG is stable right bundle branch block QRS duration 136 ms Past Medical History:   Diagnosis Date  . Abnormal mammogram of right breast 10/2016  . Aortic atherosclerosis ([place])   . Arthritis    right Thumb  . Chest pain in adult    Normal MPS 2016  . Complication of anesthesia    prolonged sedation  . Dental crowns present   . Dupuytren contracture    Bilateral hands, mild  . GERD (gastroesophageal reflux disease)   . Gestational diabetes    history of  . Grade I diastolic dysfunction 0000000   on ECHO  . 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 History of blood transfusion 1982  . History of cardiomegaly 04/14/2017   Noted on CXR  . History of hepatitis as a child    "infectious", per pt.  . History of hiatal hernia   . History of pleurisy   . History of pneumonia   . Hyperlipidemia   . Irregular heartbeat    since age 51, per pt.  . Limited joint range of motion    cervical spine - difficulty looking up for extended period  . MR (mitral regurgitation) 05/09/2017   Mild, noted on ECHO  . MVP (mitral valve prolapse)    Mild  . Orthostatic hypotension   . Osteoporosis   . PONV (postoperative nausea and vomiting)   . Pulmonary nodule 04/18/2017   5 mm left lower lobe nodule   . Seasonal allergies   . SUI (stress urinary incontinence, female)   . SVT (supraventricular tachycardia) ([place])   . TR (tricuspid regurgitation) 05/09/2017  Mild, noted on ECHO  . Wears contact lenses   . Wears glasses     Past Surgical History:  Procedure Laterality Date  . ANTERIOR CERVICAL DECOMP/DISCECTOMY FUSION  02/03/2002   C5-6  . BLADDER SUSPENSION N/A 04/26/2018   Procedure: TRANSVAGINAL TAPE (TVT) PROCEDURE;  Surgeon: Bobbye Charleston, MD;  Location: [facility];  Service: Gynecology;  Laterality: N/A;  . CESAREAN SECTION     x 3  . COLONOSCOPY  2013   several  . CYSTOCELE REPAIR N/A 04/26/2018   Procedure: ANTERIOR REPAIR (CYSTOCELE);  Surgeon: Bobbye Charleston, MD;  Location[facility];  Service: Gynecology;  Laterality:  N/A;  . CYSTOSCOPY N/A 04/26/2018   Procedure: CYSTOSCOPY;  Surgeon: Bobbye Charleston, MD;  Location[facility];  Service: Gynecology;  Laterality: N/A;  . INGUINAL HERNIA REPAIR Right   . LAPAROSCOPIC NISSEN FUNDOPLICATION  123456  . NISSEN FUNDOPLICATION  [id number]   redo  . RADIOACTIVE SEED GUIDED EXCISIONAL BREAST BIOPSY Right 11/08/2016   Procedure: RADIOACTIVE SEED GUIDED EXCISIONAL RIGHT BREAST BIOPSY;  Surgeon: Rolm Bookbinder, MD;  Location: [facility];  Service: General;  Laterality: Right;  . TONSILLECTOMY  1977  . TUBAL LIGATION    . UPPER GI ENDOSCOPY     x2  . WISDOM TOOTH EXTRACTION      Current Medications: Current Meds  Medication Sig  . acetaminophen (TYLENOL) 325 MG tablet Take 325 mg by mouth as needed.  . Beclomethasone Dipropionate (QVAR IN) Inhale 1 puff into the lungs daily.  . cetirizine (ZYRTEC) 10 MG tablet TAKE 1 TABLET (10 MG TOTAL) BY MOUTH DAILY.  [name] Coenzyme Q10 (COQ-10 PO) Take 1 capsule by mouth daily.   [name] ELIQUIS 5 MG TABS tablet TAKE 1 TABLET BY MOUTH TWICE A DAY  . EPINEPHrine 0.3 mg/0.3 mL IJ SOAJ injection Inject into the muscle as needed.   . famotidine (PEPCID) 20 MG tablet Take 20 mg by mouth 2 (two) times daily as needed for heartburn or indigestion.  . flecainide (TAMBOCOR) 50 MG tablet TAKE 1 TABLET BY MOUTH TWICE A DAY  . fluticasone (FLONASE) 50 MCG/ACT nasal spray Place 2 sprays into both nostrils in the morning and at bedtime.   . metoprolol tartrate (LOPRESSOR) 25 MG tablet TAKE 1 TABLET BY MOUTH TWICE A DAY  . montelukast (SINGULAIR) 10 MG tablet Take 10 mg by mouth at bedtime.  . Multiple Vitamins-Minerals (CENTRUM SILVER PO) Take 1 tablet by mouth daily.   . NON FORMULARY MIXED VIAL # 4 CONC. SLIT- sublingual drop for seasonal allergies  . Omega-3 Fatty Acids (FISH OIL PO) Take 1 capsule by mouth daily.   . pravastatin (PRAVACHOL) 20 MG tablet TAKE 1 TABLET BY MOUTH EVERY DAY IN THE EVENING  .  QVAR REDIHALER 40 MCG/ACT inhaler   . risedronate (ACTONEL) 35 MG tablet TAKE 1 TABLET BY MOUTH EVERY 7 DAYS WITH WATER ON EMPTY STOMACH, THEN NO FOOD, STAY UPRIGHT 30MINUTE     Allergies:   Codeine, Erythromycin, and Simvastatin   Social History   Socioeconomic History  . Marital status: Married    Spouse name: Not on file  . Number of children: Not on file  . Years of education: Not on file  . Highest education level: Not on file  Occupational History  . Not on file  Tobacco Use  . Smoking status: Never Smoker  . Smokeless tobacco: Never Used  Substance and Sexual Activity  . Alcohol use: No  .  Drug use: No  . Sexual activity: Not on file  Other Topics Concern  . Not on file  Social History Narrative   Retired high school business teacher   Master's degree educatation   Married, lives with spouse -   Social Determinants of Health   Financial Resource Strain:   . Difficulty of Paying Living Expenses:   Food Insecurity:   . Worried About Charity fundraiser in the Last Year:   . Arboriculturist in the Last Year:   Transportation Needs:   . Film/video editor (Medical):   [name] Lack of Transportation (Non-Medical):   Physical Activity:   . Days of Exercise per Week:   . Minutes of Exercise per Session:   Stress:   . Feeling of Stress :   Social Connections:   . Frequency of Communication with Friends and Family:   . Frequency of Social Gatherings with Friends and Family:   . Attends Religious Services:   . Active Member of Clubs or Organizations:   . Attends Archivist Meetings:   [name] Marital Status:      Family History: The patient's family history includes Alzheimer's disease in her father and mother; Congestive Heart Failure in her father; Heart attack in her father, maternal grandfather, maternal grandmother, paternal grandfather, and paternal grandmother; Heart disease in her father; Heart failure in her father; Hyperlipidemia in her father; Prostate  cancer in her father; Stroke in her paternal grandmother. ROS:   Please see the history of present illness.    All other systems reviewed and are negative.  EKGs/Labs/Other Studies Reviewed:    The following studies were reviewed today:  EKG:  EKG ordered today and personally reviewed.  The ekg ordered today demonstrates sinus rhythm right bundle branch block  Recent Labs: 03/05/2019: ALT 14; BUN 14; Creatinine, Ser 0.67; Potassium 4.0; Sodium 138  Recent Lipid Panel    Component Value Date/Time   CHOL 184 03/05/2019 1141   TRIG 160 (H) 03/05/2019 1141   HDL 42 03/05/2019 1141   CHOLHDL 4.4 03/05/2019 1141   CHOLHDL 5 03/04/2015 0824   VLDL 20.8 03/04/2015 0824   LDLCALC 110 (H) 03/05/2019 1141    Physical Exam:    VS:  BP 122/76   Pulse (!) 58   Temp (!) 97.1 F (36.2 C)   Ht 5\' 4"  (1.626 m)   Wt 151 lb 6.4 oz (68.7 kg)   SpO2 95%   BMI 25.99 kg/m     Wt Readings from Last 3 Encounters:  11/17/19 151 lb 6.4 oz (68.7 kg)  10/27/19 153 lb (69.4 kg)  08/18/19 151 lb 6.4 oz (68.7 kg)     GEN:  Well nourished, well developed in no acute distress HEENT: Normal NECK: No JVD; No carotid bruits LYMPHATICS: No lymphadenopathy CARDIAC: RRR, no murmurs, rubs, gallops RESPIRATORY:  Clear to auscultation without rales, wheezing or rhonchi  ABDOMEN: Soft, non-tender, non-distended MUSCULOSKELETAL:  No edema; No deformity  SKIN: Warm and dry NEUROLOGIC:  Alert and oriented x 3 PSYCHIATRIC:  Normal affect    Signed, Tina More, MD  11/17/2019 8:53 AM    [place]

## 2019-11-27 ENCOUNTER — Ambulatory Visit (INDEPENDENT_AMBULATORY_CARE_PROVIDER_SITE_OTHER): Payer: Medicare PPO | Admitting: *Deleted

## 2019-11-27 DIAGNOSIS — Z01419 Encounter for gynecological examination (general) (routine) without abnormal findings: Secondary | ICD-10-CM | POA: Diagnosis not present

## 2019-11-27 DIAGNOSIS — R55 Syncope and collapse: Secondary | ICD-10-CM

## 2019-11-27 DIAGNOSIS — Z124 Encounter for screening for malignant neoplasm of cervix: Secondary | ICD-10-CM | POA: Diagnosis not present

## 2019-11-27 LAB — CUP PACEART REMOTE DEVICE CHECK
Date Time Interrogation Session: 20210513093808
Implantable Pulse Generator Implant Date: 20210412

## 2019-12-01 NOTE — Progress Notes (Signed)
[facility] Summary Report / Loop Recorder 

## 2019-12-02 DIAGNOSIS — Z1211 Encounter for screening for malignant neoplasm of colon: Secondary | ICD-10-CM | POA: Diagnosis not present

## 2019-12-02 DIAGNOSIS — K219 Gastro-esophageal reflux disease without esophagitis: Secondary | ICD-10-CM | POA: Diagnosis not present

## 2019-12-30 ENCOUNTER — Ambulatory Visit (INDEPENDENT_AMBULATORY_CARE_PROVIDER_SITE_OTHER): Payer: Medicare PPO | Admitting: *Deleted

## 2019-12-30 DIAGNOSIS — R55 Syncope and collapse: Secondary | ICD-10-CM

## 2019-12-31 LAB — CUP PACEART REMOTE DEVICE CHECK
Date Time Interrogation Session: 20210614230313
Implantable Pulse Generator Implant Date: 20210412

## 2019-12-31 NOTE — Progress Notes (Signed)
[facility] Summary Report / Loop Recorder 

## 2020-02-02 ENCOUNTER — Ambulatory Visit (INDEPENDENT_AMBULATORY_CARE_PROVIDER_SITE_OTHER): Payer: Medicare PPO | Admitting: *Deleted

## 2020-02-02 DIAGNOSIS — R55 Syncope and collapse: Secondary | ICD-10-CM

## 2020-02-02 LAB — CUP PACEART REMOTE DEVICE CHECK
Date Time Interrogation Session: 20210718230520
Implantable Pulse Generator Implant Date: 20210412

## 2020-02-04 NOTE — Progress Notes (Signed)
[facility] Summary Report / Loop Recorder 

## 2020-03-05 DIAGNOSIS — L570 Actinic keratosis: Secondary | ICD-10-CM | POA: Diagnosis not present

## 2020-03-08 ENCOUNTER — Ambulatory Visit (INDEPENDENT_AMBULATORY_CARE_PROVIDER_SITE_OTHER): Payer: Medicare PPO | Admitting: *Deleted

## 2020-03-08 DIAGNOSIS — R55 Syncope and collapse: Secondary | ICD-10-CM | POA: Diagnosis not present

## 2020-03-08 LAB — CUP PACEART REMOTE DEVICE CHECK
Date Time Interrogation Session: 20210820230124
Implantable Pulse Generator Implant Date: 20210412

## 2020-03-12 NOTE — Progress Notes (Signed)
[facility] Summary Report / Loop Recorder 

## 2020-03-21 ENCOUNTER — Other Ambulatory Visit: Payer: Self-pay | Admitting: Cardiology

## 2020-03-26 ENCOUNTER — Other Ambulatory Visit: Payer: Self-pay | Admitting: Cardiology

## 2020-04-12 ENCOUNTER — Ambulatory Visit (INDEPENDENT_AMBULATORY_CARE_PROVIDER_SITE_OTHER): Payer: Medicare PPO | Admitting: Emergency Medicine

## 2020-04-12 DIAGNOSIS — I480 Paroxysmal atrial fibrillation: Secondary | ICD-10-CM

## 2020-04-12 LAB — CUP PACEART REMOTE DEVICE CHECK
Date Time Interrogation Session: 20210922230621
Implantable Pulse Generator Implant Date: 20210412

## 2020-04-14 NOTE — Progress Notes (Signed)
[facility] Summary Report / Loop Recorder 

## 2020-04-26 DIAGNOSIS — H9313 Tinnitus, bilateral: Secondary | ICD-10-CM | POA: Diagnosis not present

## 2020-04-26 DIAGNOSIS — H903 Sensorineural hearing loss, bilateral: Secondary | ICD-10-CM | POA: Diagnosis not present

## 2020-04-26 HISTORY — DX: Sensorineural hearing loss, bilateral: H90.3

## 2020-04-26 HISTORY — DX: Tinnitus, bilateral: H93.13

## 2020-04-28 DIAGNOSIS — Z973 Presence of spectacles and contact lenses: Secondary | ICD-10-CM | POA: Insufficient documentation

## 2020-04-28 DIAGNOSIS — Z9889 Other specified postprocedural states: Secondary | ICD-10-CM | POA: Insufficient documentation

## 2020-04-28 DIAGNOSIS — I951 Orthostatic hypotension: Secondary | ICD-10-CM | POA: Insufficient documentation

## 2020-04-28 DIAGNOSIS — I499 Cardiac arrhythmia, unspecified: Secondary | ICD-10-CM | POA: Insufficient documentation

## 2020-04-28 DIAGNOSIS — N390 Urinary tract infection, site not specified: Secondary | ICD-10-CM

## 2020-04-28 DIAGNOSIS — M810 Age-related osteoporosis without current pathological fracture: Secondary | ICD-10-CM | POA: Insufficient documentation

## 2020-04-28 DIAGNOSIS — Z8719 Personal history of other diseases of the digestive system: Secondary | ICD-10-CM | POA: Insufficient documentation

## 2020-04-28 DIAGNOSIS — Z8709 Personal history of other diseases of the respiratory system: Secondary | ICD-10-CM | POA: Insufficient documentation

## 2020-04-28 DIAGNOSIS — T8859XA Other complications of anesthesia, initial encounter: Secondary | ICD-10-CM | POA: Insufficient documentation

## 2020-04-28 DIAGNOSIS — E785 Hyperlipidemia, unspecified: Secondary | ICD-10-CM | POA: Insufficient documentation

## 2020-04-28 DIAGNOSIS — Z98811 Dental restoration status: Secondary | ICD-10-CM | POA: Insufficient documentation

## 2020-04-28 DIAGNOSIS — N952 Postmenopausal atrophic vaginitis: Secondary | ICD-10-CM

## 2020-04-28 DIAGNOSIS — M2560 Stiffness of unspecified joint, not elsewhere classified: Secondary | ICD-10-CM | POA: Insufficient documentation

## 2020-04-28 DIAGNOSIS — N393 Stress incontinence (female) (male): Secondary | ICD-10-CM | POA: Insufficient documentation

## 2020-04-28 DIAGNOSIS — M720 Palmar fascial fibromatosis [Dupuytren]: Secondary | ICD-10-CM | POA: Insufficient documentation

## 2020-04-28 DIAGNOSIS — Z8701 Personal history of pneumonia (recurrent): Secondary | ICD-10-CM | POA: Insufficient documentation

## 2020-04-28 DIAGNOSIS — I341 Nonrheumatic mitral (valve) prolapse: Secondary | ICD-10-CM | POA: Insufficient documentation

## 2020-04-28 DIAGNOSIS — R079 Chest pain, unspecified: Secondary | ICD-10-CM | POA: Insufficient documentation

## 2020-04-28 DIAGNOSIS — M1990 Unspecified osteoarthritis, unspecified site: Secondary | ICD-10-CM | POA: Insufficient documentation

## 2020-04-28 DIAGNOSIS — I700 Atherosclerosis of aorta: Secondary | ICD-10-CM | POA: Insufficient documentation

## 2020-04-28 DIAGNOSIS — O24419 Gestational diabetes mellitus in pregnancy, unspecified control: Secondary | ICD-10-CM | POA: Insufficient documentation

## 2020-04-28 DIAGNOSIS — K759 Inflammatory liver disease, unspecified: Secondary | ICD-10-CM | POA: Insufficient documentation

## 2020-04-28 DIAGNOSIS — J302 Other seasonal allergic rhinitis: Secondary | ICD-10-CM | POA: Insufficient documentation

## 2020-04-28 HISTORY — DX: Postmenopausal atrophic vaginitis: N95.2

## 2020-04-28 HISTORY — DX: Urinary tract infection, site not specified: N39.0

## 2020-04-29 ENCOUNTER — Ambulatory Visit: Payer: Medicare PPO | Admitting: Cardiology

## 2020-04-29 DIAGNOSIS — L821 Other seborrheic keratosis: Secondary | ICD-10-CM | POA: Diagnosis not present

## 2020-04-29 DIAGNOSIS — L728 Other follicular cysts of the skin and subcutaneous tissue: Secondary | ICD-10-CM | POA: Diagnosis not present

## 2020-04-30 DIAGNOSIS — Z23 Encounter for immunization: Secondary | ICD-10-CM | POA: Diagnosis not present

## 2020-05-02 NOTE — Progress Notes (Signed)
Cardiology Office Note:    Date:  05/03/2020   ID:  Tina [name], DOB Jun 06, 1951, MRN [id number]  PCP:  Serita Grammes, MD  Cardiologist:  Shirlee More, MD    Referring MD: Serita Grammes, MD    ASSESSMENT:    1. PAF (paroxysmal atrial fibrillation) ([place])   2. Mixed hyperlipidemia   3. High risk medication use   4. Bradycardia   5. Syncope, unspecified syncope type    PLAN:    In order of problems listed above:  1. Doing well no recurrence continue low-dose flecainide no evidence of 1C toxicity on EKG reduce her beta-blocker dose with resting bradycardia and continue anticoagulant.  I reviewed her loop recorder downloads and she is reassured 2. Continue statin check liver function lipid profile 3. Stable continue low-dose flecainide also check flecainide level 4. Stable no significant bradycardia however reduce her beta-blocker dose to 50% 5. Stable no recurrence since loop recorder insertion   Next appointment: 6 months   Medication Adjustments/Labs and Tests Ordered: Current medicines are reviewed at length with the patient today.  Concerns regarding medicines are outlined above.  Orders Placed This Encounter  Procedures  . Comprehensive metabolic panel  . Lipid panel  . EKG 12-Lead   Meds ordered this encounter  Medications  . metoprolol tartrate (LOPRESSOR) 25 MG tablet    Sig: Take 0.5 tablets (12.5 mg total) by mouth 2 (two) times daily.    Dispense:  180 tablet    Refill:  1    No chief complaint on file.   History of Present Illness:    Tina [name] is a 69 y.o. female with a hx of PAF and SVT suppressed with flecainide and anticoagulated with an implanted loop recorder for syncope last seen 11/17/2019.  Last device download 04/12/2020 showed no bradycardia or atrial fibrillation and or symptomatic events 5 total showed occasional PVCs with sinus rhythm. Compliance with diet, lifestyle and medications: Yes  In general she is done well has  had her third dose of mRNA vaccine but continues to have episodes of palpitation not severe not sustained has not fainted and I reviewed with her her single PVCs and benign nature she is reassured.  Occasionally she takes Celebrex for the relief of chest wall pain.  No syncope shortness of breath very little palpitation. Past Medical History:  Diagnosis Date  . Abnormal mammogram of right breast 10/2016  . Allergy to pollen 09/26/2018  . Anxiety and depression 02/01/2011  . Aortic atherosclerosis ([place])   . Arthritis    right Thumb  . Atrophic vaginitis 04/28/2020  . Chest pain in adult    Normal MPS 2016  . Chronic anticoagulation 09/09/2018  . Complication of anesthesia    prolonged sedation  . Dental crowns present   . Dupuytren contracture    Bilateral hands, mild  . GERD (gastroesophageal reflux disease)   . Gestational diabetes    history of  . Grade I diastolic dysfunction 72/53/6644   on ECHO  . Heart palpitations 10/13/2010   Overview:  Last Assessment & Plan:  Long hx of same - controlled with beta-blocker  ?orthostatic symptoms in AM with low normal BP - pt will monitor to see if symptoms correlate with BP reading No changes recommended today - reassured re: cards recommendation to take extra 1/2 tab prn symptoms   . Hepatitis    age 19 "infectious"  . High risk medication use 09/09/2018  . History of blood transfusion 1982  .  History of cardiomegaly 04/14/2017   Noted on CXR  . History of hepatitis as a child    "infectious", per pt.  . History of hiatal hernia   . History of pleurisy   . History of pneumonia   . Hypercholesterolemia    Overview:  Last Assessment & Plan:  On statin Well controlled last check, monitor annually The current medical regimen is effective;  continue present plan and medications.   . Hyperlipidemia   . Irregular heartbeat    since age 31, per pt.  Marrian Salvage eyes 09/15/2019  . Leg pain, right 03/20/2012  . Limited joint range of motion    cervical  spine - difficulty looking up for extended period  . MR (mitral regurgitation) 05/09/2017   Mild, noted on ECHO  . MVP (mitral valve prolapse)    Mild  . Myalgia due to statin 03/07/2018  . Orthostatic hypotension   . Osteoporosis   . Osteoporosis, post-menopausal    hx RLE fib fx 03/2012 following fall DEXA 03/13/13 @ [facility]: -2.5 at spine   . PAF (paroxysmal atrial fibrillation) ([place]) 09/04/2018  . Pleuritic chest pain 08/24/2014  . Pleuritis 04/17/2017  . PONV (postoperative nausea and vomiting)   . Postural dizziness 02/01/2011  . Pulmonary nodule 04/18/2017   5 mm left lower lobe nodule   . Right thyroid nodule 09/26/2018  . Seasonal allergies   . 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  . SUI (stress urinary incontinence, female)   . SVT (supraventricular tachycardia) ([place])   . Syncope 08/21/2018  . Tinnitus of both ears 04/26/2020  . TR (tricuspid regurgitation) 05/09/2017   Mild, noted on ECHO  . Urinary tract infectious disease 04/28/2020  . Wears contact lenses   . Wears glasses     Past Surgical History:  Procedure Laterality Date  . ANTERIOR CERVICAL DECOMP/DISCECTOMY FUSION  02/03/2002   C5-6  . BLADDER SUSPENSION N/A 04/26/2018   Procedure: TRANSVAGINAL TAPE (TVT) PROCEDURE;  Surgeon: Bobbye Charleston, MD;  Location: [facility];  Service: Gynecology;  Laterality: N/A;  . CESAREAN SECTION     x 3  . COLONOSCOPY  2013   several  . CYSTOCELE REPAIR N/A 04/26/2018   Procedure: ANTERIOR REPAIR (CYSTOCELE);  Surgeon: Bobbye Charleston, MD;  Location: [facility];  Service: Gynecology;  Laterality: N/A;  . CYSTOSCOPY N/A 04/26/2018   Procedure: CYSTOSCOPY;  Surgeon: Bobbye Charleston, MD;  Location: [facility];  Service: Gynecology;  Laterality: N/A;  . INGUINAL HERNIA REPAIR Right   . LAPAROSCOPIC NISSEN FUNDOPLICATION  65/78/4696  . NISSEN FUNDOPLICATION  29/52/8413   redo  . RADIOACTIVE SEED GUIDED  EXCISIONAL BREAST BIOPSY Right 11/08/2016   Procedure: RADIOACTIVE SEED GUIDED EXCISIONAL RIGHT BREAST BIOPSY;  Surgeon: Rolm Bookbinder, MD;  Location: [facility];  Service: General;  Laterality: Right;  . TONSILLECTOMY  1977  . TUBAL LIGATION    . UPPER GI ENDOSCOPY     x2  . WISDOM TOOTH EXTRACTION      Current Medications: Current Meds  Medication Sig  . acetaminophen (TYLENOL) 325 MG tablet Take 325 mg by mouth as needed.  . Beclomethasone Dipropionate (QVAR IN) Inhale 1 puff into the lungs daily.  . celecoxib (CELEBREX) 100 MG capsule TAKE 1 CAPSULE BY MOUTH TWICE A DAY  . cetirizine (ZYRTEC) 10 MG tablet TAKE 1 TABLET (10 MG TOTAL) BY MOUTH DAILY.  [name] Coenzyme Q10 (COQ-10 PO) Take 1 capsule by mouth daily.   [name]  ELIQUIS 5 MG TABS tablet TAKE 1 TABLET BY MOUTH TWICE A DAY  . EPINEPHrine 0.3 mg/0.3 mL IJ SOAJ injection Inject into the muscle as needed.   . famotidine (PEPCID) 20 MG tablet Take 20 mg by mouth 2 (two) times daily as needed for heartburn or indigestion.  . flecainide (TAMBOCOR) 50 MG tablet TAKE 1 TABLET BY MOUTH TWICE A DAY  . fluticasone (FLONASE) 50 MCG/ACT nasal spray Place 2 sprays into both nostrils in the morning and at bedtime.   . montelukast (SINGULAIR) 10 MG tablet Take 10 mg by mouth at bedtime.  . Multiple Vitamins-Minerals (CENTRUM SILVER PO) Take 1 tablet by mouth daily.   . NON FORMULARY MIXED VIAL # 4 CONC. SLIT- sublingual drop for seasonal allergies  . Omega-3 Fatty Acids (FISH OIL PO) Take 1 capsule by mouth daily.   . pravastatin (PRAVACHOL) 20 MG tablet TAKE 1 TABLET BY MOUTH EVERY DAY IN THE EVENING  . risedronate (ACTONEL) 35 MG tablet TAKE 1 TABLET BY MOUTH EVERY 7 DAYS WITH WATER ON EMPTY STOMACH, THEN NO FOOD, STAY UPRIGHT 30MINUTE  . [DISCONTINUED] QVAR REDIHALER 40 MCG/ACT inhaler      Allergies:   Codeine, Erythromycin, and Simvastatin   Social History   Socioeconomic History  . Marital status: Married    Spouse name:  Not on file  . Number of children: Not on file  . Years of education: Not on file  . Highest education level: Not on file  Occupational History  . Not on file  Tobacco Use  . Smoking status: Never Smoker  . Smokeless tobacco: Never Used  Vaping Use  . Vaping Use: Never used  Substance and Sexual Activity  . Alcohol use: No  . Drug use: No  . Sexual activity: Not on file  Other Topics Concern  . Not on file  Social History Narrative   Retired high school business teacher   Master's degree educatation   Married, lives with spouse -   Social Determinants of Health   Financial Resource Strain:   . Difficulty of Paying Living Expenses: Not on file  Food Insecurity:   . Worried About Charity fundraiser in the Last Year: Not on file  . Ran Out of Food in the Last Year: Not on file  Transportation Needs:   . Lack of Transportation (Medical): Not on file  . Lack of Transportation (Non-Medical): Not on file  Physical Activity:   . Days of Exercise per Week: Not on file  . Minutes of Exercise per Session: Not on file  Stress:   . Feeling of Stress : Not on file  Social Connections:   . Frequency of Communication with Friends and Family: Not on file  . Frequency of Social Gatherings with Friends and Family: Not on file  . Attends Religious Services: Not on file  . Active Member of Clubs or Organizations: Not on file  . Attends Archivist Meetings: Not on file  . Marital Status: Not on file     Family History: The patient's family history includes Alzheimer's disease in her father and mother; Congestive Heart Failure in her father; Heart attack in her father, maternal grandfather, maternal grandmother, paternal grandfather, and paternal grandmother; Heart disease in her father; Heart failure in her father; Hyperlipidemia in her father; Prostate cancer in her father; Stroke in her paternal grandmother. ROS:   Please see the history of present illness.    All other  systems reviewed and are negative.  EKGs/Labs/Other Studies Reviewed:    The following studies were reviewed today:  EKG:  EKG ordered today and personally reviewed.  The ekg ordered today demonstrates sinus rhythm right bundle branch block 63 bpm  Recent Labs: No results found for requested labs within last 8760 hours.  Recent Lipid Panel    Component Value Date/Time   CHOL 184 03/05/2019 1141   TRIG 160 (H) 03/05/2019 1141   HDL 42 03/05/2019 1141   CHOLHDL 4.4 03/05/2019 1141   CHOLHDL 5 03/04/2015 0824   VLDL 20.8 03/04/2015 0824   LDLCALC 110 (H) 03/05/2019 1141    Physical Exam:    VS:  BP 127/79   Pulse 63   Ht 5\' 4"  (1.626 m)   Wt 151 lb (68.5 kg)   SpO2 93%   BMI 25.92 kg/m     Wt Readings from Last 3 Encounters:  05/03/20 151 lb (68.5 kg)  11/17/19 151 lb 6.4 oz (68.7 kg)  10/27/19 153 lb (69.4 kg)     GEN:  Well nourished, well developed in no acute distress HEENT: Normal NECK: No JVD; No carotid bruits LYMPHATICS: No lymphadenopathy CARDIAC: RRR, no murmurs, rubs, gallops RESPIRATORY:  Clear to auscultation without rales, wheezing or rhonchi  ABDOMEN: Soft, non-tender, non-distended MUSCULOSKELETAL:  No edema; No deformity  SKIN: Warm and dry NEUROLOGIC:  Alert and oriented x 3 PSYCHIATRIC:  Normal affect    Signed, Shirlee More, MD  05/03/2020 9:46 AM    [place]

## 2020-05-03 ENCOUNTER — Encounter: Payer: Self-pay | Admitting: Cardiology

## 2020-05-03 ENCOUNTER — Other Ambulatory Visit: Payer: Self-pay

## 2020-05-03 ENCOUNTER — Ambulatory Visit: Payer: Medicare PPO | Admitting: Cardiology

## 2020-05-03 VITALS — BP 127/79 | HR 63 | Ht 64.0 in | Wt 151.0 lb

## 2020-05-03 DIAGNOSIS — I480 Paroxysmal atrial fibrillation: Secondary | ICD-10-CM | POA: Diagnosis not present

## 2020-05-03 DIAGNOSIS — Z79899 Other long term (current) drug therapy: Secondary | ICD-10-CM | POA: Diagnosis not present

## 2020-05-03 DIAGNOSIS — R55 Syncope and collapse: Secondary | ICD-10-CM | POA: Diagnosis not present

## 2020-05-03 DIAGNOSIS — E782 Mixed hyperlipidemia: Secondary | ICD-10-CM | POA: Diagnosis not present

## 2020-05-03 DIAGNOSIS — R001 Bradycardia, unspecified: Secondary | ICD-10-CM

## 2020-05-03 LAB — COMPREHENSIVE METABOLIC PANEL
ALT: 16 IU/L (ref 0–32)
AST: 20 IU/L (ref 0–40)
Albumin/Globulin Ratio: 1.5 (ref 1.2–2.2)
Albumin: 4.5 g/dL (ref 3.8–4.8)
Alkaline Phosphatase: 98 IU/L (ref 44–121)
BUN/Creatinine Ratio: 21 (ref 12–28)
BUN: 19 mg/dL (ref 8–27)
Bilirubin Total: 0.8 mg/dL (ref 0.0–1.2)
CO2: 24 mmol/L (ref 20–29)
Calcium: 9.8 mg/dL (ref 8.7–10.3)
Chloride: 100 mmol/L (ref 96–106)
Creatinine, Ser: 0.89 mg/dL (ref 0.57–1.00)
GFR calc Af Amer: 76 mL/min/{1.73_m2} (ref >59)
GFR calc non Af Amer: 66 mL/min/{1.73_m2} (ref >59)
Globulin, Total: 3 g/dL (ref 1.5–4.5)
Glucose: 92 mg/dL (ref 65–99)
Potassium: 4.4 mmol/L (ref 3.5–5.2)
Sodium: 137 mmol/L (ref 134–144)
Total Protein: 7.5 g/dL (ref 6.0–8.5)

## 2020-05-03 LAB — LIPID PANEL
Chol/HDL Ratio: 5.1 ratio — ABNORMAL HIGH (ref 0.0–4.4)
Cholesterol, Total: 228 mg/dL — ABNORMAL HIGH (ref 100–199)
HDL: 45 mg/dL (ref >39)
LDL Chol Calc (NIH): 154 mg/dL — ABNORMAL HIGH (ref 0–99)
Triglycerides: 158 mg/dL — ABNORMAL HIGH (ref 0–149)
VLDL Cholesterol Cal: 29 mg/dL (ref 5–40)

## 2020-05-03 MED ORDER — METOPROLOL TARTRATE 25 MG PO TABS: 12.5000 mg | ORAL_TABLET | Freq: Two times a day (BID) | ORAL | 1 refills | Status: DC

## 2020-05-03 NOTE — Patient Instructions (Signed)
Medication Instructions:  Your physician has recommended you make the following change in your medication: DECREASE: Lopressor 12.5 mg per 0.5 tablet by mouth twice daily.   *If you need a refill on your cardiac medications before your next appointment, please call your pharmacy*   Lab Work: Your physician recommends that you return for lab work in: [place], Lipids If you have labs (blood work) drawn today and your tests are completely normal, you will receive your results only by: [name] MyChart Message (if you have MyChart) OR . A paper copy in the mail If you have any lab test that is abnormal or we need to change your treatment, we will call you to review the results.   Testing/Procedures: None   Follow-Up: At [facility], you and your health needs are our priority.  As part of our continuing mission to provide you with exceptional heart care, we have created designated Provider Care Teams.  These Care Teams include your primary Cardiologist (physician) and Advanced Practice Providers (APPs -  Physician Assistants and Nurse Practitioners) who all work together to provide you with the care you need, when you need it.  We recommend signing up for the patient portal called "MyChart".  Sign up information is provided on this After Visit Summary.  MyChart is used to connect with patients for Virtual Visits (Telemedicine).  Patients are able to view lab/test results, encounter notes, upcoming appointments, etc.  Non-urgent messages can be sent to your provider as well.   To learn more about what you can do with MyChart, go to [email or web address].    Your next appointment:   6 month(s)  The format for your next appointment:   In Person  Provider:   Shirlee More, MD   Other Instructions

## 2020-05-03 NOTE — Addendum Note (Signed)
Addended by: Resa Miner I on: 05/03/2020 09:48 AM   Modules accepted: Orders

## 2020-05-04 ENCOUNTER — Telehealth: Payer: Self-pay

## 2020-05-04 DIAGNOSIS — E782 Mixed hyperlipidemia: Secondary | ICD-10-CM

## 2020-05-04 MED ORDER — EZETIMIBE 10 MG PO TABS: 5.0000 mg | ORAL_TABLET | Freq: Every day | ORAL | 3 refills | Status: DC

## 2020-05-04 NOTE — Telephone Encounter (Signed)
Spoke with patient regarding results and recommendation.  Patient verbalizes understanding and is agreeable to plan of care. Advised patient to call back with any issues or concerns.  

## 2020-05-04 NOTE — Telephone Encounter (Signed)
-----   Message from Richardo Priest, MD sent at 05/04/2020  8:04 AM EDT ----- Lipids are not is good, is she still taking pravastatin.

## 2020-05-04 NOTE — Telephone Encounter (Signed)
Spoke to the patient just now and she let me know that she is still taking her pravastatin 20 mg daily. I will route this to Dr. Bettina Gavia to review and advise further.

## 2020-05-04 NOTE — Telephone Encounter (Signed)
She did not tolerate more potent statins  Lets place her on low-dose Zetia 5 mg daily in about 6 weeks recheck her lipids I suspect  will be ideal.

## 2020-05-04 NOTE — Addendum Note (Signed)
Addended by: Resa Miner I on: 05/04/2020 08:49 AM   Modules accepted: Orders

## 2020-05-05 LAB — FLECAINIDE LEVEL: Flecainide: 0.38 ug/mL (ref 0.20–1.00)

## 2020-05-15 LAB — CUP PACEART REMOTE DEVICE CHECK
Date Time Interrogation Session: 20211025230553
Implantable Pulse Generator Implant Date: 20210412

## 2020-05-17 ENCOUNTER — Ambulatory Visit (INDEPENDENT_AMBULATORY_CARE_PROVIDER_SITE_OTHER): Payer: Medicare PPO

## 2020-05-17 DIAGNOSIS — R55 Syncope and collapse: Secondary | ICD-10-CM | POA: Diagnosis not present

## 2020-05-19 NOTE — Progress Notes (Signed)
[facility] Summary Report / Loop Recorder 

## 2020-06-18 DIAGNOSIS — M720 Palmar fascial fibromatosis [Dupuytren]: Secondary | ICD-10-CM | POA: Diagnosis not present

## 2020-06-18 LAB — CUP PACEART REMOTE DEVICE CHECK
Date Time Interrogation Session: 20211127230449
Implantable Pulse Generator Implant Date: 20210412

## 2020-06-21 ENCOUNTER — Ambulatory Visit (INDEPENDENT_AMBULATORY_CARE_PROVIDER_SITE_OTHER): Payer: Medicare PPO

## 2020-06-21 DIAGNOSIS — I480 Paroxysmal atrial fibrillation: Secondary | ICD-10-CM

## 2020-06-21 DIAGNOSIS — Z1231 Encounter for screening mammogram for malignant neoplasm of breast: Secondary | ICD-10-CM | POA: Diagnosis not present

## 2020-06-23 ENCOUNTER — Other Ambulatory Visit: Payer: Self-pay

## 2020-06-23 DIAGNOSIS — E782 Mixed hyperlipidemia: Secondary | ICD-10-CM | POA: Diagnosis not present

## 2020-06-23 LAB — LIPID PANEL
Chol/HDL Ratio: 3.5 ratio (ref 0.0–4.4)
Cholesterol, Total: 161 mg/dL (ref 100–199)
HDL: 46 mg/dL (ref >39)
LDL Chol Calc (NIH): 91 mg/dL (ref 0–99)
Triglycerides: 139 mg/dL (ref 0–149)
VLDL Cholesterol Cal: 24 mg/dL (ref 5–40)

## 2020-06-24 ENCOUNTER — Telehealth: Payer: Self-pay

## 2020-06-24 NOTE — Telephone Encounter (Signed)
Spoke with patient regarding results and recommendation.  Patient verbalizes understanding and is agreeable to plan of care. Advised patient to call back with any issues or concerns.  

## 2020-06-24 NOTE — Telephone Encounter (Signed)
-----   Message from Richardo Priest, MD sent at 06/24/2020  7:55 AM EST ----- Her lipids are ideal no changes

## 2020-06-27 ENCOUNTER — Other Ambulatory Visit: Payer: Self-pay | Admitting: Cardiology

## 2020-06-30 NOTE — Progress Notes (Signed)
[facility] Summary Report / Loop Recorder 

## 2020-07-13 DIAGNOSIS — J440 Chronic obstructive pulmonary disease with acute lower respiratory infection: Secondary | ICD-10-CM | POA: Diagnosis not present

## 2020-07-13 DIAGNOSIS — Z20828 Contact with and (suspected) exposure to other viral communicable diseases: Secondary | ICD-10-CM | POA: Diagnosis not present

## 2020-07-21 DIAGNOSIS — R051 Acute cough: Secondary | ICD-10-CM | POA: Diagnosis not present

## 2020-07-21 DIAGNOSIS — J329 Chronic sinusitis, unspecified: Secondary | ICD-10-CM | POA: Diagnosis not present

## 2020-07-21 DIAGNOSIS — J40 Bronchitis, not specified as acute or chronic: Secondary | ICD-10-CM | POA: Diagnosis not present

## 2020-07-21 DIAGNOSIS — Z20828 Contact with and (suspected) exposure to other viral communicable diseases: Secondary | ICD-10-CM | POA: Diagnosis not present

## 2020-07-26 ENCOUNTER — Ambulatory Visit (INDEPENDENT_AMBULATORY_CARE_PROVIDER_SITE_OTHER): Payer: Medicare PPO

## 2020-07-26 DIAGNOSIS — R55 Syncope and collapse: Secondary | ICD-10-CM

## 2020-07-26 LAB — CUP PACEART REMOTE DEVICE CHECK
Date Time Interrogation Session: 20220108230436
Implantable Pulse Generator Implant Date: 20210412

## 2020-08-09 NOTE — Progress Notes (Signed)
[facility] Summary Report / Loop Recorder 

## 2020-08-11 DIAGNOSIS — Z0000 Encounter for general adult medical examination without abnormal findings: Secondary | ICD-10-CM | POA: Diagnosis not present

## 2020-08-11 DIAGNOSIS — K219 Gastro-esophageal reflux disease without esophagitis: Secondary | ICD-10-CM | POA: Diagnosis not present

## 2020-08-11 DIAGNOSIS — Z131 Encounter for screening for diabetes mellitus: Secondary | ICD-10-CM | POA: Diagnosis not present

## 2020-08-11 DIAGNOSIS — J40 Bronchitis, not specified as acute or chronic: Secondary | ICD-10-CM | POA: Diagnosis not present

## 2020-08-11 DIAGNOSIS — M810 Age-related osteoporosis without current pathological fracture: Secondary | ICD-10-CM | POA: Diagnosis not present

## 2020-08-11 DIAGNOSIS — J329 Chronic sinusitis, unspecified: Secondary | ICD-10-CM | POA: Diagnosis not present

## 2020-08-11 DIAGNOSIS — I4891 Unspecified atrial fibrillation: Secondary | ICD-10-CM | POA: Diagnosis not present

## 2020-08-11 DIAGNOSIS — E7800 Pure hypercholesterolemia, unspecified: Secondary | ICD-10-CM | POA: Diagnosis not present

## 2020-08-11 DIAGNOSIS — Z79899 Other long term (current) drug therapy: Secondary | ICD-10-CM | POA: Diagnosis not present

## 2020-08-28 LAB — CUP PACEART REMOTE DEVICE CHECK
Date Time Interrogation Session: 20220210230133
Implantable Pulse Generator Implant Date: 20210412

## 2020-08-30 ENCOUNTER — Ambulatory Visit (INDEPENDENT_AMBULATORY_CARE_PROVIDER_SITE_OTHER): Payer: Medicare PPO

## 2020-08-30 DIAGNOSIS — H903 Sensorineural hearing loss, bilateral: Secondary | ICD-10-CM | POA: Diagnosis not present

## 2020-08-30 DIAGNOSIS — I471 Supraventricular tachycardia: Secondary | ICD-10-CM

## 2020-08-30 DIAGNOSIS — H9313 Tinnitus, bilateral: Secondary | ICD-10-CM | POA: Diagnosis not present

## 2020-08-31 DIAGNOSIS — M810 Age-related osteoporosis without current pathological fracture: Secondary | ICD-10-CM | POA: Diagnosis not present

## 2020-09-06 NOTE — Progress Notes (Signed)
[facility] Summary Report / Loop Recorder 

## 2020-09-14 ENCOUNTER — Other Ambulatory Visit: Payer: Self-pay | Admitting: Cardiology

## 2020-09-20 ENCOUNTER — Other Ambulatory Visit: Payer: Self-pay | Admitting: Cardiology

## 2020-09-20 ENCOUNTER — Telehealth: Payer: Self-pay | Admitting: *Deleted

## 2020-09-20 MED ORDER — METOPROLOL TARTRATE 25 MG PO TABS: 12.5000 mg | ORAL_TABLET | Freq: Two times a day (BID) | ORAL | 1 refills | Status: DC

## 2020-09-20 NOTE — Telephone Encounter (Signed)
Rx refill sent to pharmacy. 

## 2020-09-30 LAB — CUP PACEART REMOTE DEVICE CHECK
Date Time Interrogation Session: 20220315230249
Implantable Pulse Generator Implant Date: 20210412

## 2020-10-04 ENCOUNTER — Ambulatory Visit (INDEPENDENT_AMBULATORY_CARE_PROVIDER_SITE_OTHER): Payer: Medicare PPO

## 2020-10-04 DIAGNOSIS — R55 Syncope and collapse: Secondary | ICD-10-CM | POA: Diagnosis not present

## 2020-10-04 DIAGNOSIS — E7800 Pure hypercholesterolemia, unspecified: Secondary | ICD-10-CM | POA: Insufficient documentation

## 2020-10-12 DIAGNOSIS — H903 Sensorineural hearing loss, bilateral: Secondary | ICD-10-CM | POA: Diagnosis not present

## 2020-10-12 DIAGNOSIS — Z9109 Other allergy status, other than to drugs and biological substances: Secondary | ICD-10-CM | POA: Diagnosis not present

## 2020-10-12 DIAGNOSIS — E041 Nontoxic single thyroid nodule: Secondary | ICD-10-CM | POA: Diagnosis not present

## 2020-10-12 NOTE — Progress Notes (Signed)
[facility] Summary Report / Loop Recorder 

## 2020-10-14 DIAGNOSIS — D1801 Hemangioma of skin and subcutaneous tissue: Secondary | ICD-10-CM | POA: Diagnosis not present

## 2020-10-14 DIAGNOSIS — D225 Melanocytic nevi of trunk: Secondary | ICD-10-CM | POA: Diagnosis not present

## 2020-10-14 DIAGNOSIS — L814 Other melanin hyperpigmentation: Secondary | ICD-10-CM | POA: Diagnosis not present

## 2020-10-14 DIAGNOSIS — L570 Actinic keratosis: Secondary | ICD-10-CM | POA: Diagnosis not present

## 2020-10-14 DIAGNOSIS — L821 Other seborrheic keratosis: Secondary | ICD-10-CM | POA: Diagnosis not present

## 2020-10-19 DIAGNOSIS — E041 Nontoxic single thyroid nodule: Secondary | ICD-10-CM | POA: Diagnosis not present

## 2020-10-19 NOTE — Progress Notes (Signed)
Cardiology Office Note:    Date:  10/20/2020   ID:  Tina [name], DOB 22-Oct-1950, MRN [id number]  PCP:  Serita Grammes, MD  Cardiologist:  Shirlee More, MD    Referring MD: Serita Grammes, MD    ASSESSMENT:    1. PAF (paroxysmal atrial fibrillation) ([place])   2. SVT (supraventricular tachycardia) ([place])   3. High risk medication use   4. Chronic anticoagulation    PLAN:    In order of problems listed above:  1. Overall is done well with atrial fibrillation no clinical recurrence on low-dose flecainide continue anticoagulation with moderate stroke risk check level as rarely at times he can have neurologic toxicity with flecainide and continue her current antiarrhythmic drug 2. Continue anticoagulation she has had no bleeding complication   Next appointment: 6 months   Medication Adjustments/Labs and Tests Ordered: Current medicines are reviewed at length with the patient today.  Concerns regarding medicines are outlined above.  No orders of the defined types were placed in this encounter.  No orders of the defined types were placed in this encounter.   No chief complaint on file.   History of Present Illness:    Tina [name] is a 70 y.o. female with a hx of paroxysmal atrial fibrillation SVT suppressed with flecainide anticoagulated she has an implantable loop recorder for syncope and has had mild sinus bradycardia.  She was last seen 05/03/2020. Compliance with diet, lifestyle and medications: Yes  She has had no episodes of atrial fibrillation no bleeding complication of her anticoagulant. Her mother had Parkinson's she has a mild tremor she is worried she has none of the stigmata.  I told her she continued to be concerned there is a imaging modality tagging dopamine to identify people early into the disease process.  I do not think this is flecainide toxicity but I will check a level today and continue her low-dose.  She also recently has been having typical  vertigo and struggles with hearing aids. Past Medical History:  Diagnosis Date  . Abnormal mammogram of right breast 10/2016  . Allergy to pollen 09/26/2018  . Anxiety and depression 02/01/2011  . Aortic atherosclerosis ([place])   . Arthritis    right Thumb  . Atrophic vaginitis 04/28/2020  . Chest pain in adult    Normal MPS 2016  . Chronic anticoagulation 09/09/2018  . Complication of anesthesia    prolonged sedation  . Dental crowns present   . Dupuytren contracture    Bilateral hands, mild  . GERD (gastroesophageal reflux disease)   . Gestational diabetes    history of  . Grade I diastolic dysfunction 79/08/4095   on ECHO  . Heart palpitations 10/13/2010   Overview:  Last Assessment & Plan:  Long hx of same - controlled with beta-blocker  ?orthostatic symptoms in AM with low normal BP - pt will monitor to see if symptoms correlate with BP reading No changes recommended today - reassured re: cards recommendation to take extra 1/2 tab prn symptoms   . Hepatitis    age 54 "infectious"  . High risk medication use 09/09/2018  . History of blood transfusion 1982  . History of cardiomegaly 04/14/2017   Noted on CXR  . History of hepatitis as a child    "infectious", per pt.  . History of hiatal hernia   . History of pleurisy   . History of pneumonia   . Hypercholesterolemia    Overview:  Last Assessment & Plan:  On statin  Well controlled last check, monitor annually The current medical regimen is effective;  continue present plan and medications.   . Hyperlipidemia   . Irregular heartbeat    since age 69, per pt.  Tina [name] eyes 09/15/2019  . Leg pain, right 03/20/2012  . Limited joint range of motion    cervical spine - difficulty looking up for extended period  . MR (mitral regurgitation) 05/09/2017   Mild, noted on ECHO  . MVP (mitral valve prolapse)    Mild  . Myalgia due to statin 03/07/2018  . Orthostatic hypotension   . Osteoporosis   . Osteoporosis, post-menopausal    hx  RLE fib fx 03/2012 following fall DEXA 03/13/13 @ [facility]: -2.5 at spine   . PAF (paroxysmal atrial fibrillation) ([place]) 09/04/2018  . Pleuritic chest pain 08/24/2014  . Pleuritis 04/17/2017  . PONV (postoperative nausea and vomiting)   . Postural dizziness 02/01/2011  . Pulmonary nodule 04/18/2017   5 mm left lower lobe nodule   . Right thyroid nodule 09/26/2018  . Seasonal allergies   . 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  . SUI (stress urinary incontinence, female)   . SVT (supraventricular tachycardia) ([place])   . Syncope 08/21/2018  . Tinnitus of both ears 04/26/2020  . TR (tricuspid regurgitation) 05/09/2017   Mild, noted on ECHO  . Urinary tract infectious disease 04/28/2020  . Wears contact lenses   . Wears glasses     Past Surgical History:  Procedure Laterality Date  . ANTERIOR CERVICAL DECOMP/DISCECTOMY FUSION  02/03/2002   C5-6  . BLADDER SUSPENSION N/A 04/26/2018   Procedure: TRANSVAGINAL TAPE (TVT) PROCEDURE;  Surgeon: Bobbye Charleston, MD;  Location: [place];  Service: Gynecology;  Laterality: N/A;  . CESAREAN SECTION     x 3  . COLONOSCOPY  2013   several  . CYSTOCELE REPAIR N/A 04/26/2018   Procedure: ANTERIOR REPAIR (CYSTOCELE);  Surgeon: Bobbye Charleston, MD;  Location[facility];  Service: Gynecology;  Laterality: N/A;  . CYSTOSCOPY N/A 04/26/2018   Procedure: CYSTOSCOPY;  Surgeon: Bobbye Charleston, MD;  Location: [facility];  Service: Gynecology;  Laterality: N/A;  . INGUINAL HERNIA REPAIR Right   . LAPAROSCOPIC NISSEN FUNDOPLICATION  57/84/6962  . NISSEN FUNDOPLICATION  95/28/4132   redo  . RADIOACTIVE SEED GUIDED EXCISIONAL BREAST BIOPSY Right 11/08/2016   Procedure: RADIOACTIVE SEED GUIDED EXCISIONAL RIGHT BREAST BIOPSY;  Surgeon: Rolm Bookbinder, MD;  Location: [place];  Service: General;  Laterality: Right;  . TONSILLECTOMY  1977  . TUBAL LIGATION    . UPPER GI ENDOSCOPY      x2  . WISDOM TOOTH EXTRACTION      Current Medications: Current Meds  Medication Sig  . acetaminophen (TYLENOL) 325 MG tablet Take 325 mg by mouth as needed.  . celecoxib (CELEBREX) 100 MG capsule TAKE 1 CAPSULE BY MOUTH TWICE A DAY  . cetirizine (ZYRTEC) 10 MG tablet TAKE 1 TABLET (10 MG TOTAL) BY MOUTH DAILY.  [name] Coenzyme Q10 (COQ-10 PO) Take 1 capsule by mouth daily.  [name] ELIQUIS 5 MG TABS tablet TAKE 1 TABLET BY MOUTH TWICE A DAY  . EPINEPHrine 0.3 mg/0.3 mL IJ SOAJ injection Inject into the muscle as needed.   . ezetimibe (ZETIA) 10 MG tablet Take 0.5 tablets (5 mg total) by mouth daily.  . famotidine (PEPCID) 20 MG tablet Take 20 mg by mouth 2 (two) times daily as needed for heartburn or indigestion.  . flecainide (  TAMBOCOR) 50 MG tablet TAKE 1 TABLET BY MOUTH TWICE A DAY  . fluticasone (FLONASE) 50 MCG/ACT nasal spray Place 2 sprays into both nostrils in the morning and at bedtime.   . metoprolol tartrate (LOPRESSOR) 25 MG tablet Take 0.5 tablets (12.5 mg total) by mouth 2 (two) times daily.  . montelukast (SINGULAIR) 10 MG tablet Take 10 mg by mouth at bedtime.  . Multiple Vitamins-Minerals (CENTRUM SILVER PO) Take 1 tablet by mouth daily.   . NON FORMULARY MIXED VIAL # 4 CONC. SLIT- sublingual drop for seasonal allergies  . Omega-3 Fatty Acids (FISH OIL PO) Take 1 capsule by mouth daily.   . pravastatin (PRAVACHOL) 20 MG tablet TAKE 1 TABLET BY MOUTH EVERY DAY IN THE EVENING     Allergies:   Codeine, Erythromycin, Simvastatin, and Other   Social History   Socioeconomic History  . Marital status: Married    Spouse name: Not on file  . Number of children: Not on file  . Years of education: Not on file  . Highest education level: Not on file  Occupational History  . Not on file  Tobacco Use  . Smoking status: Never Smoker  . Smokeless tobacco: Never Used  Vaping Use  . Vaping Use: Never used  Substance and Sexual Activity  . Alcohol use: No  . Drug use: No  .  Sexual activity: Not on file  Other Topics Concern  . Not on file  Social History Narrative   Retired high school business teacher   Master's degree educatation   Married, lives with spouse -   Social Determinants of Radio broadcast assistant Strain: Not on file  Food Insecurity: Not on file  Transportation Needs: Not on file  Physical Activity: Not on file  Stress: Not on file  Social Connections: Not on file     Family History: The patient's family history includes Alzheimer's disease in her father and mother; Congestive Heart Failure in her father; Heart attack in her father, maternal grandfather, maternal grandmother, paternal grandfather, and paternal grandmother; Heart disease in her father; Heart failure in her father; Hyperlipidemia in her father; Prostate cancer in her father; Stroke in her paternal grandmother. ROS:   Please see the history of present illness.    All other systems reviewed and are negative.  EKGs/Labs/Other Studies Reviewed:    The following studies were reviewed today:  EKG:  EKG ordered today and personally reviewed.  The ekg ordered today demonstrates sinus rhythm right bundle branch block her QRS duration is less than her previous EKG 836 ms not typical of 1C toxicity  Recent Labs: 05/03/2020: ALT 16; BUN 19; Creatinine, Ser 0.89; Potassium 4.4; Sodium 137  Recent Lipid Panel    Component Value Date/Time   CHOL 161 06/23/2020 0817   TRIG 139 06/23/2020 0817   HDL 46 06/23/2020 0817   CHOLHDL 3.5 06/23/2020 0817   CHOLHDL 5 03/04/2015 0824   VLDL 20.8 03/04/2015 0824   LDLCALC 91 06/23/2020 0817    Physical Exam:    VS:  BP 118/70 (BP Location: Right Arm, Patient Position: Sitting)   Pulse 63   Ht 5\' 4"  (1.626 m)   Wt 151 lb 9.6 oz (68.8 kg)   SpO2 96%   BMI 26.02 kg/m     Wt Readings from Last 3 Encounters:  10/20/20 151 lb 9.6 oz (68.8 kg)  05/03/20 151 lb (68.5 kg)  11/17/19 151 lb 6.4 oz (68.7 kg)     GEN:  Well  nourished, well developed in no acute distress she has no tremor or rigidity HEENT: Normal NECK: No JVD; No carotid bruits LYMPHATICS: No lymphadenopathy CARDIAC: RRR, no murmurs, rubs, gallops RESPIRATORY:  Clear to auscultation without rales, wheezing or rhonchi  ABDOMEN: Soft, non-tender, non-distended MUSCULOSKELETAL:  No edema; No deformity  SKIN: Warm and dry NEUROLOGIC:  Alert and oriented x 3 PSYCHIATRIC:  Normal affect    Signed, Shirlee More, MD  10/20/2020 1:11 PM    [place]

## 2020-10-20 ENCOUNTER — Ambulatory Visit: Payer: Medicare PPO | Admitting: Cardiology

## 2020-10-20 ENCOUNTER — Encounter: Payer: Self-pay | Admitting: Cardiology

## 2020-10-20 ENCOUNTER — Other Ambulatory Visit: Payer: Self-pay

## 2020-10-20 VITALS — BP 118/70 | HR 63 | Ht 64.0 in | Wt 151.6 lb

## 2020-10-20 DIAGNOSIS — Z7901 Long term (current) use of anticoagulants: Secondary | ICD-10-CM | POA: Diagnosis not present

## 2020-10-20 DIAGNOSIS — R12 Heartburn: Secondary | ICD-10-CM

## 2020-10-20 DIAGNOSIS — Z79899 Other long term (current) drug therapy: Secondary | ICD-10-CM

## 2020-10-20 DIAGNOSIS — F411 Generalized anxiety disorder: Secondary | ICD-10-CM

## 2020-10-20 DIAGNOSIS — R198 Other specified symptoms and signs involving the digestive system and abdomen: Secondary | ICD-10-CM | POA: Insufficient documentation

## 2020-10-20 DIAGNOSIS — I480 Paroxysmal atrial fibrillation: Secondary | ICD-10-CM

## 2020-10-20 DIAGNOSIS — I471 Supraventricular tachycardia: Secondary | ICD-10-CM | POA: Diagnosis not present

## 2020-10-20 DIAGNOSIS — R0989 Other specified symptoms and signs involving the circulatory and respiratory systems: Secondary | ICD-10-CM | POA: Insufficient documentation

## 2020-10-20 DIAGNOSIS — R09A2 Foreign body sensation, throat: Secondary | ICD-10-CM

## 2020-10-20 HISTORY — DX: Heartburn: R12

## 2020-10-20 HISTORY — DX: Foreign body sensation, throat: R09.A2

## 2020-10-20 HISTORY — DX: Generalized anxiety disorder: F41.1

## 2020-10-20 NOTE — Patient Instructions (Signed)
Medication Instructions:  Your physician recommends that you continue on your current medications as directed. Please refer to the Current Medication list given to you today.  *If you need a refill on your cardiac medications before your next appointment, please call your pharmacy*   Lab Work: Your physician recommends that you return for lab work in: TODAY Flecainide level If you have labs (blood work) drawn today and your tests are completely normal, you will receive your results only by: [name] MyChart Message (if you have MyChart) OR . A paper copy in the mail If you have any lab test that is abnormal or we need to change your treatment, we will call you to review the results.   Testing/Procedures: None   Follow-Up: At [facility], you and your health needs are our priority.  As part of our continuing mission to provide you with exceptional heart care, we have created designated Provider Care Teams.  These Care Teams include your primary Cardiologist (physician) and Advanced Practice Providers (APPs -  Physician Assistants and Nurse Practitioners) who all work together to provide you with the care you need, when you need it.  We recommend signing up for the patient portal called "MyChart".  Sign up information is provided on this After Visit Summary.  MyChart is used to connect with patients for Virtual Visits (Telemedicine).  Patients are able to view lab/test results, encounter notes, upcoming appointments, etc.  Non-urgent messages can be sent to your provider as well.   To learn more about what you can do with MyChart, go to [email or web address].    Your next appointment:   6 month(s)  The format for your next appointment:   In Person  Provider:   Shirlee More, MD   Other Instructions

## 2020-10-23 LAB — FLECAINIDE LEVEL: Flecainide: 0.38 ug/mL (ref 0.20–1.00)

## 2020-10-25 ENCOUNTER — Telehealth: Payer: Self-pay

## 2020-10-25 NOTE — Telephone Encounter (Signed)
-----   Message from Richardo Priest, MD sent at 10/23/2020  2:09 PM EDT ----- Good result her flecainide level is therapeutic no change in dosage

## 2020-10-25 NOTE — Telephone Encounter (Signed)
Spoke with patient regarding results and recommendation.  Patient verbalizes understanding and is agreeable to plan of care. Advised patient to call back with any issues or concerns.  

## 2020-11-01 ENCOUNTER — Ambulatory Visit (INDEPENDENT_AMBULATORY_CARE_PROVIDER_SITE_OTHER): Payer: Medicare PPO

## 2020-11-01 DIAGNOSIS — I480 Paroxysmal atrial fibrillation: Secondary | ICD-10-CM

## 2020-11-03 DIAGNOSIS — I480 Paroxysmal atrial fibrillation: Secondary | ICD-10-CM | POA: Diagnosis not present

## 2020-11-03 LAB — CUP PACEART REMOTE DEVICE CHECK
Date Time Interrogation Session: 20220417230558
Implantable Pulse Generator Implant Date: 20210412

## 2020-11-18 NOTE — Progress Notes (Signed)
[facility] Summary Report / Loop Recorder 

## 2020-12-06 ENCOUNTER — Ambulatory Visit (INDEPENDENT_AMBULATORY_CARE_PROVIDER_SITE_OTHER): Payer: Medicare PPO

## 2020-12-06 DIAGNOSIS — R55 Syncope and collapse: Secondary | ICD-10-CM

## 2020-12-07 LAB — CUP PACEART REMOTE DEVICE CHECK
Date Time Interrogation Session: 20220520230304
Implantable Pulse Generator Implant Date: 20210412

## 2020-12-11 ENCOUNTER — Other Ambulatory Visit: Payer: Self-pay | Admitting: Cardiology

## 2020-12-14 NOTE — Telephone Encounter (Signed)
Refill sent to pharmacy.   

## 2020-12-28 NOTE — Progress Notes (Signed)
[facility] Summary Report / Loop Recorder 

## 2021-01-06 LAB — CUP PACEART REMOTE DEVICE CHECK
Date Time Interrogation Session: 20220623095750
Implantable Pulse Generator Implant Date: 20210412

## 2021-01-10 ENCOUNTER — Ambulatory Visit (INDEPENDENT_AMBULATORY_CARE_PROVIDER_SITE_OTHER): Payer: Medicare PPO

## 2021-01-10 DIAGNOSIS — R55 Syncope and collapse: Secondary | ICD-10-CM | POA: Diagnosis not present

## 2021-01-28 NOTE — Progress Notes (Signed)
[facility] Summary Report / Loop Recorder 

## 2021-02-04 DIAGNOSIS — M810 Age-related osteoporosis without current pathological fracture: Secondary | ICD-10-CM | POA: Diagnosis not present

## 2021-02-04 DIAGNOSIS — Z6825 Body mass index (BMI) 25.0-25.9, adult: Secondary | ICD-10-CM | POA: Diagnosis not present

## 2021-02-04 DIAGNOSIS — K219 Gastro-esophageal reflux disease without esophagitis: Secondary | ICD-10-CM | POA: Diagnosis not present

## 2021-02-04 DIAGNOSIS — I4891 Unspecified atrial fibrillation: Secondary | ICD-10-CM | POA: Diagnosis not present

## 2021-02-10 ENCOUNTER — Ambulatory Visit (INDEPENDENT_AMBULATORY_CARE_PROVIDER_SITE_OTHER): Payer: Medicare PPO

## 2021-02-10 DIAGNOSIS — R55 Syncope and collapse: Secondary | ICD-10-CM | POA: Diagnosis not present

## 2021-02-12 LAB — CUP PACEART REMOTE DEVICE CHECK
Date Time Interrogation Session: 20220729200811
Implantable Pulse Generator Implant Date: 20210412

## 2021-03-03 DIAGNOSIS — Z20828 Contact with and (suspected) exposure to other viral communicable diseases: Secondary | ICD-10-CM | POA: Diagnosis not present

## 2021-03-09 NOTE — Progress Notes (Signed)
[facility] Summary Report / Loop Recorder 

## 2021-03-13 ENCOUNTER — Other Ambulatory Visit: Payer: Self-pay | Admitting: Cardiology

## 2021-03-15 ENCOUNTER — Ambulatory Visit (INDEPENDENT_AMBULATORY_CARE_PROVIDER_SITE_OTHER): Payer: Medicare PPO

## 2021-03-15 DIAGNOSIS — R55 Syncope and collapse: Secondary | ICD-10-CM | POA: Diagnosis not present

## 2021-03-15 LAB — CUP PACEART REMOTE DEVICE CHECK
Date Time Interrogation Session: 20220827230724
Implantable Pulse Generator Implant Date: 20210412

## 2021-03-17 DIAGNOSIS — R0981 Nasal congestion: Secondary | ICD-10-CM | POA: Diagnosis not present

## 2021-03-17 DIAGNOSIS — R051 Acute cough: Secondary | ICD-10-CM | POA: Diagnosis not present

## 2021-03-17 DIAGNOSIS — Z20828 Contact with and (suspected) exposure to other viral communicable diseases: Secondary | ICD-10-CM | POA: Diagnosis not present

## 2021-03-17 DIAGNOSIS — K625 Hemorrhage of anus and rectum: Secondary | ICD-10-CM | POA: Diagnosis not present

## 2021-03-17 DIAGNOSIS — R319 Hematuria, unspecified: Secondary | ICD-10-CM | POA: Diagnosis not present

## 2021-03-22 DIAGNOSIS — Z20828 Contact with and (suspected) exposure to other viral communicable diseases: Secondary | ICD-10-CM | POA: Diagnosis not present

## 2021-03-22 DIAGNOSIS — J209 Acute bronchitis, unspecified: Secondary | ICD-10-CM | POA: Diagnosis not present

## 2021-03-23 DIAGNOSIS — R058 Other specified cough: Secondary | ICD-10-CM | POA: Diagnosis not present

## 2021-03-23 DIAGNOSIS — Z20822 Contact with and (suspected) exposure to covid-19: Secondary | ICD-10-CM | POA: Diagnosis not present

## 2021-03-23 DIAGNOSIS — G250 Essential tremor: Secondary | ICD-10-CM | POA: Diagnosis not present

## 2021-03-23 DIAGNOSIS — N950 Postmenopausal bleeding: Secondary | ICD-10-CM | POA: Diagnosis not present

## 2021-03-23 DIAGNOSIS — Z6826 Body mass index (BMI) 26.0-26.9, adult: Secondary | ICD-10-CM | POA: Diagnosis not present

## 2021-03-24 DIAGNOSIS — M810 Age-related osteoporosis without current pathological fracture: Secondary | ICD-10-CM | POA: Diagnosis not present

## 2021-03-26 ENCOUNTER — Other Ambulatory Visit: Payer: Self-pay | Admitting: Cardiology

## 2021-03-28 NOTE — Telephone Encounter (Signed)
Zetia 10 mg # 45 x 3 refills sent to   : [id number] [place]

## 2021-03-29 NOTE — Progress Notes (Signed)
[facility] Summary Report / Loop Recorder 

## 2021-04-12 DIAGNOSIS — Z23 Encounter for immunization: Secondary | ICD-10-CM | POA: Diagnosis not present

## 2021-04-14 DIAGNOSIS — J449 Chronic obstructive pulmonary disease, unspecified: Secondary | ICD-10-CM

## 2021-04-14 DIAGNOSIS — N950 Postmenopausal bleeding: Secondary | ICD-10-CM | POA: Diagnosis not present

## 2021-04-14 HISTORY — DX: Chronic obstructive pulmonary disease, unspecified: J44.9

## 2021-04-18 ENCOUNTER — Ambulatory Visit (INDEPENDENT_AMBULATORY_CARE_PROVIDER_SITE_OTHER): Payer: Medicare PPO

## 2021-04-18 DIAGNOSIS — R55 Syncope and collapse: Secondary | ICD-10-CM

## 2021-04-19 DIAGNOSIS — L292 Pruritus vulvae: Secondary | ICD-10-CM

## 2021-04-19 DIAGNOSIS — Z23 Encounter for immunization: Secondary | ICD-10-CM | POA: Diagnosis not present

## 2021-04-19 HISTORY — DX: Pruritus vulvae: L29.2

## 2021-04-20 LAB — CUP PACEART REMOTE DEVICE CHECK
Date Time Interrogation Session: 20220929230740
Implantable Pulse Generator Implant Date: 20210412

## 2021-04-22 DIAGNOSIS — N950 Postmenopausal bleeding: Secondary | ICD-10-CM | POA: Diagnosis not present

## 2021-04-22 DIAGNOSIS — Z6826 Body mass index (BMI) 26.0-26.9, adult: Secondary | ICD-10-CM | POA: Diagnosis not present

## 2021-04-22 DIAGNOSIS — G250 Essential tremor: Secondary | ICD-10-CM | POA: Diagnosis not present

## 2021-04-22 DIAGNOSIS — J45991 Cough variant asthma: Secondary | ICD-10-CM | POA: Diagnosis not present

## 2021-04-22 DIAGNOSIS — I4891 Unspecified atrial fibrillation: Secondary | ICD-10-CM | POA: Diagnosis not present

## 2021-04-26 NOTE — Progress Notes (Signed)
[facility] Summary Report / Loop Recorder 

## 2021-05-03 DIAGNOSIS — S91202A Unspecified open wound of left great toe with damage to nail, initial encounter: Secondary | ICD-10-CM | POA: Diagnosis not present

## 2021-05-04 ENCOUNTER — Ambulatory Visit: Payer: Medicare PPO | Admitting: Cardiology

## 2021-05-04 ENCOUNTER — Encounter: Payer: Self-pay | Admitting: Cardiology

## 2021-05-04 ENCOUNTER — Other Ambulatory Visit: Payer: Self-pay

## 2021-05-04 VITALS — BP 128/68 | HR 60 | Ht 64.0 in | Wt 150.2 lb

## 2021-05-04 DIAGNOSIS — I471 Supraventricular tachycardia, unspecified: Secondary | ICD-10-CM

## 2021-05-04 DIAGNOSIS — Z79899 Other long term (current) drug therapy: Secondary | ICD-10-CM

## 2021-05-04 DIAGNOSIS — I480 Paroxysmal atrial fibrillation: Secondary | ICD-10-CM

## 2021-05-04 DIAGNOSIS — Z7901 Long term (current) use of anticoagulants: Secondary | ICD-10-CM

## 2021-05-04 NOTE — Progress Notes (Signed)
Cardiology Office Note:    Date:  05/04/2021   ID:  Tina [name], DOB [birth year], MRN [id number]  PCP:  Serita Grammes, MD  Cardiologist:  Shirlee More, MD    Referring MD: Serita Grammes, MD    ASSESSMENT:    1. PAF (paroxysmal atrial fibrillation) ([place])   2. SVT (supraventricular tachycardia) ([place])   3. Chronic anticoagulation   4. High risk medication use    PLAN:    In order of problems listed above:  She continues to do well having no recurrent atrial tachyarrhythmias on low-dose flecainide with abnormal EKG with bundle branch block I will check a flecainide level today continue her anticoagulant continue to monitor rhythm loop recorder previous syncope and sinus bradycardia.   Next appointment: 6 months   Medication Adjustments/Labs and Tests Ordered: Current medicines are reviewed at length with the patient today.  Concerns regarding medicines are outlined above.  Orders Placed This Encounter  Procedures   Comprehensive metabolic panel   Lipid panel   Flecainide level   EKG 12-Lead   No orders of the defined types were placed in this encounter.   Follow-up for atrial fibrillation, recently had COVID-19 treated with antiviral treatment and I am now taking low-dose propranolol for tremor   History of Present Illness:    Tina [name] is a 70 y.o. female with a hx of paroxysmal atrial fibrillation and SVT suppressed with flecainide chronic anticoagulation and implantable loop recorder for syncope and mild sinus bradycardia last seen 10/20/2020 maintaining sinus rhythm on flecainide.  Compliance with diet, lifestyle and medications: Yes  She traveled to [place].  Cyril Mourning unfortunately came home with respiratory symptoms from the COVID-19 and was treated with: Antiviral drug and recovered. She takes a minimum dose of propranolol 5 mg daily this helps with the pain She has had no palpitation recurrent atrial fibrillation or syncope.  Her latest loop  recorder download 04/20/2021 shows normal device function no new episodes of atrial fibrillation or episodes of bradycardia. Past Medical History:  Diagnosis Date   Abnormal mammogram of right breast 10/2016   Allergy to pollen 09/26/2018   Anxiety and depression 02/01/2011   Aortic atherosclerosis ([facility])    Arthritis    right Thumb   Atrophic vaginitis 04/28/2020   Chest pain in adult    Normal MPS 2016   Chronic anticoagulation 7/51/7001   Complication of anesthesia    prolonged sedation   Dental crowns present    Dupuytren contracture    Bilateral hands, mild   GERD (gastroesophageal reflux disease)    Gestational diabetes    history of   Grade I diastolic dysfunction 74/94/4967   on ECHO   Heart palpitations 10/13/2010   Overview:  Last Assessment & Plan:  Long hx of same - controlled with beta-blocker  ?orthostatic symptoms in AM with low normal BP - pt will monitor to see if symptoms correlate with BP reading No changes recommended today - reassured re: cards recommendation to take extra 1/2 tab prn symptoms    Hepatitis    age 72 "infectious"   High risk medication use 09/09/2018   History of blood transfusion 1982   History of cardiomegaly 04/14/2017   Noted on CXR   History of hepatitis as a child    "infectious", per pt.   History of hiatal hernia    History of pleurisy    History of pneumonia    Hypercholesterolemia    Overview:  Last Assessment & Plan:  On  statin Well controlled last check, monitor annually The current medical regimen is effective;  continue present plan and medications.    Hyperlipidemia    Irregular heartbeat    since age 24, per pt.   Itchy eyes 09/15/2019   Leg pain, right 03/20/2012   Limited joint range of motion    cervical spine - difficulty looking up for extended period   MR (mitral regurgitation) 05/09/2017   Mild, noted on ECHO   MVP (mitral valve prolapse)    Mild   Myalgia due to statin 03/07/2018   Orthostatic hypotension     Osteoporosis    Osteoporosis, post-menopausal    hx RLE fib fx 03/2012 following fall DEXA 03/13/13 @ [facility]: -2.5 at spine    PAF (paroxysmal atrial fibrillation) ([place]) 09/04/2018   Pleuritic chest pain 08/24/2014   Pleuritis 04/17/2017   PONV (postoperative nausea and vomiting)    Postural dizziness 02/01/2011   Pulmonary nodule 04/18/2017   5 mm left lower lobe nodule    Right thyroid nodule 09/26/2018   Seasonal allergies    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   SUI (stress urinary incontinence, female)    SVT (supraventricular tachycardia) ([place])    Syncope 08/21/2018   Tinnitus of both ears 04/26/2020   TR (tricuspid regurgitation) 05/09/2017   Mild, noted on ECHO   Urinary tract infectious disease 04/28/2020   Wears contact lenses    Wears glasses     Past Surgical History:  Procedure Laterality Date   ANTERIOR CERVICAL DECOMP/DISCECTOMY FUSION  02/03/2002   C5-6   BLADDER SUSPENSION N/A 04/26/2018   Procedure: TRANSVAGINAL TAPE (TVT) PROCEDURE;  Surgeon: Bobbye Charleston, MD;  Location: [place];  Service: Gynecology;  Laterality: N/A;   CESAREAN SECTION     x 3   COLONOSCOPY  2013   several   CYSTOCELE REPAIR N/A 04/26/2018   Procedure: ANTERIOR REPAIR (CYSTOCELE);  Surgeon: Bobbye Charleston, MD;  Location: [facility];  Service: Gynecology;  Laterality: N/A;   CYSTOSCOPY N/A 04/26/2018   Procedure: CYSTOSCOPY;  Surgeon: Bobbye Charleston, MD;  Location: [facility];  Service: Gynecology;  Laterality: N/A;   INGUINAL HERNIA REPAIR Right    LAPAROSCOPIC NISSEN FUNDOPLICATION  25/11/3974   NISSEN FUNDOPLICATION  73/41/9379   redo   RADIOACTIVE SEED GUIDED EXCISIONAL BREAST BIOPSY Right 11/08/2016   Procedure: RADIOACTIVE SEED GUIDED EXCISIONAL RIGHT BREAST BIOPSY;  Surgeon: Rolm Bookbinder, MD;  Location: [facility];  Service: General;  Laterality: Right;   TONSILLECTOMY  1977   TUBAL LIGATION      UPPER GI ENDOSCOPY     x2   WISDOM TOOTH EXTRACTION      Current Medications: Current Meds  Medication Sig   acetaminophen (TYLENOL) 325 MG tablet Take 325 mg by mouth as needed.   Calcium Carb-Cholecalciferol 600-200 MG-UNIT TABS Take 1 tablet by mouth daily.   celecoxib (CELEBREX) 100 MG capsule TAKE 1 CAPSULE BY MOUTH TWICE A DAY   cetirizine (ZYRTEC) 10 MG tablet TAKE 1 TABLET (10 MG TOTAL) BY MOUTH DAILY.   Coenzyme Q10 (COQ-10 PO) Take 1 capsule by mouth daily.   ELIQUIS 5 MG TABS tablet TAKE 1 TABLET BY MOUTH TWICE A DAY   EPINEPHrine 0.3 mg/0.3 mL IJ SOAJ injection Inject into the muscle as needed.    ezetimibe (ZETIA) 10 MG tablet TAKE 1/2 TABLET BY MOUTH DAILY   famotidine (PEPCID) 20 MG tablet Take 20 mg by mouth 2 (two)  times daily as needed for heartburn or indigestion.   flecainide (TAMBOCOR) 50 MG tablet TAKE 1 TABLET BY MOUTH TWICE A DAY   fluticasone (FLONASE) 50 MCG/ACT nasal spray Place 2 sprays into both nostrils in the morning and at bedtime.    Fluticasone Propionate HFA (FLOVENT HFA IN) daily.   meclizine (ANTIVERT) 25 MG tablet Take 25 mg by mouth 3 (three) times daily as needed for dizziness.   metoprolol tartrate (LOPRESSOR) 25 MG tablet Take 0.5 tablets (12.5 mg total) by mouth 2 (two) times daily.   montelukast (SINGULAIR) 10 MG tablet Take 10 mg by mouth at bedtime.   Multiple Vitamins-Minerals (CENTRUM SILVER PO) Take 1 tablet by mouth daily.    NON FORMULARY MIXED VIAL # 4 CONC. SLIT- sublingual drop for seasonal allergies   olopatadine (PATANOL) 0.1 % ophthalmic solution 2 (two) times daily as needed.   Omega-3 Fatty Acids (FISH OIL PO) Take 1 capsule by mouth daily.    pravastatin (PRAVACHOL) 20 MG tablet TAKE 1 TABLET BY MOUTH EVERY DAY IN THE EVENING     Allergies:   Codeine, Erythromycin, Simvastatin, and Other   Social History   Socioeconomic History   Marital status: Married    Spouse name: Not on file   Number of children: Not on file    Years of education: Not on file   Highest education level: Not on file  Occupational History   Not on file  Tobacco Use   Smoking status: Never   Smokeless tobacco: Never  Vaping Use   Vaping Use: Never used  Substance and Sexual Activity   Alcohol use: No   Drug use: No   Sexual activity: Not on file  Other Topics Concern   Not on file  Social History Narrative   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   Married, lives with spouse -   Social Determinants of Radio broadcast assistant Strain: Not on file  Food Insecurity: Not on file  Transportation Needs: Not on file  Physical Activity: Not on file  Stress: Not on file  Social Connections: Not on file     Family History: The patient's family history includes Alzheimer's disease in her father and mother; Congestive Heart Failure in her father; Heart attack in her father, maternal grandfather, maternal grandmother, paternal grandfather, and paternal grandmother; Heart disease in her father; Heart failure in her father; Hyperlipidemia in her father; Prostate cancer in her father; Stroke in her paternal grandmother. ROS:   Please see the history of present illness.    All other systems reviewed and are negative.  EKGs/Labs/Other Studies Reviewed:    The following studies were reviewed today:  EKG:  EKG ordered today and personally reviewed.  The ekg ordered today demonstrates sinus rhythm 60 bpm right bundle branch block  Recent Labs: No results found for requested labs within last 8760 hours.  Recent Lipid Panel    Component Value Date/Time   CHOL 161 06/23/2020 0817   TRIG 139 06/23/2020 0817   HDL 46 06/23/2020 0817   CHOLHDL 3.5 06/23/2020 0817   CHOLHDL 5 03/04/2015 0824   VLDL 20.8 03/04/2015 0824   LDLCALC 91 06/23/2020 0817    Physical Exam:    VS:  There were no vitals taken for this visit.    Wt Readings from Last 3 Encounters:  10/20/20 151 lb 9.6 oz (68.8 kg)   05/03/20 151 lb (68.5 kg)  11/17/19 151 lb 6.4 oz (68.7 kg)  GEN:  Well nourished, well developed in no acute distress HEENT: Normal NECK: No JVD; No carotid bruits LYMPHATICS: No lymphadenopathy CARDIAC: RRR, no murmurs, rubs, gallops RESPIRATORY:  Clear to auscultation without rales, wheezing or rhonchi  ABDOMEN: Soft, non-tender, non-distended MUSCULOSKELETAL:  No edema; No deformity  SKIN: Warm and dry NEUROLOGIC:  Alert and oriented x 3 PSYCHIATRIC:  Normal affect    Signed, Shirlee More, MD  05/04/2021 1:20 PM    [facility]

## 2021-05-04 NOTE — Patient Instructions (Signed)
Medication Instructions:  Your physician recommends that you continue on your current medications as directed. Please refer to the Current Medication list given to you today.  *If you need a refill on your cardiac medications before your next appointment, please call your pharmacy*   Lab Work: Your physician recommends that you return for lab work in: TODAY CMP, Lipids, Flecainide  If you have labs (blood work) drawn today and your tests are completely normal, you will receive your results only by: [place] (if you have MyChart) OR A paper copy in the mail If you have any lab test that is abnormal or we need to change your treatment, we will call you to review the results.   Testing/Procedures: None   Follow-Up: At [facility], you and your health needs are our priority.  As part of our continuing mission to provide you with exceptional heart care, we have created designated Provider Care Teams.  These Care Teams include your primary Cardiologist (physician) and Advanced Practice Providers (APPs -  Physician Assistants and Nurse Practitioners) who all work together to provide you with the care you need, when you need it.  We recommend signing up for the patient portal called "MyChart".  Sign up information is provided on this After Visit Summary.  MyChart is used to connect with patients for Virtual Visits (Telemedicine).  Patients are able to view lab/test results, encounter notes, upcoming appointments, etc.  Non-urgent messages can be sent to your provider as well.   To learn more about what you can do with MyChart, go to [email or web address].    Your next appointment:   6 month(s)  The format for your next appointment:   In Person  Provider:   Shirlee More, MD   Other Instructions

## 2021-05-18 ENCOUNTER — Telehealth: Payer: Self-pay

## 2021-05-18 LAB — CUP PACEART REMOTE DEVICE CHECK
Date Time Interrogation Session: 20221101230358
Implantable Pulse Generator Implant Date: 20210412

## 2021-05-18 LAB — COMPREHENSIVE METABOLIC PANEL
ALT: 13 IU/L (ref 0–32)
AST: 18 IU/L (ref 0–40)
Albumin/Globulin Ratio: 1.6 (ref 1.2–2.2)
Albumin: 4.5 g/dL (ref 3.8–4.8)
Alkaline Phosphatase: 95 IU/L (ref 44–121)
BUN/Creatinine Ratio: 27 (ref 12–28)
BUN: 21 mg/dL (ref 8–27)
Bilirubin Total: 0.4 mg/dL (ref 0.0–1.2)
CO2: 23 mmol/L (ref 20–29)
Calcium: 10 mg/dL (ref 8.7–10.3)
Chloride: 104 mmol/L (ref 96–106)
Creatinine, Ser: 0.79 mg/dL (ref 0.57–1.00)
Globulin, Total: 2.9 g/dL (ref 1.5–4.5)
Glucose: 82 mg/dL (ref 70–99)
Potassium: 4.4 mmol/L (ref 3.5–5.2)
Sodium: 142 mmol/L (ref 134–144)
Total Protein: 7.4 g/dL (ref 6.0–8.5)
eGFR: 80 mL/min/{1.73_m2} (ref >59)

## 2021-05-18 LAB — LIPID PANEL
Chol/HDL Ratio: 3.7 ratio (ref 0.0–4.4)
Cholesterol, Total: 164 mg/dL (ref 100–199)
HDL: 44 mg/dL (ref >39)
LDL Chol Calc (NIH): 88 mg/dL (ref 0–99)
Triglycerides: 190 mg/dL — ABNORMAL HIGH (ref 0–149)
VLDL Cholesterol Cal: 32 mg/dL (ref 5–40)

## 2021-05-18 LAB — FLECAINIDE LEVEL: Flecainide: 0.36 ug/mL (ref 0.20–1.00)

## 2021-05-18 NOTE — Telephone Encounter (Signed)
Spoke with patient regarding results and recommendation.  Patient verbalizes understanding and is agreeable to plan of care. Advised patient to call back with any issues or concerns.  

## 2021-05-18 NOTE — Telephone Encounter (Signed)
-----   Message from Richardo Priest, MD sent at 05/18/2021 12:06 PM EDT ----- Normal or stable result  All are good results no changes

## 2021-05-23 ENCOUNTER — Ambulatory Visit (INDEPENDENT_AMBULATORY_CARE_PROVIDER_SITE_OTHER): Payer: Medicare PPO

## 2021-05-23 DIAGNOSIS — M810 Age-related osteoporosis without current pathological fracture: Secondary | ICD-10-CM | POA: Diagnosis not present

## 2021-05-23 DIAGNOSIS — G250 Essential tremor: Secondary | ICD-10-CM | POA: Diagnosis not present

## 2021-05-23 DIAGNOSIS — R55 Syncope and collapse: Secondary | ICD-10-CM

## 2021-05-23 DIAGNOSIS — M5442 Lumbago with sciatica, left side: Secondary | ICD-10-CM | POA: Diagnosis not present

## 2021-05-23 DIAGNOSIS — M25551 Pain in right hip: Secondary | ICD-10-CM | POA: Diagnosis not present

## 2021-05-23 DIAGNOSIS — Z6826 Body mass index (BMI) 26.0-26.9, adult: Secondary | ICD-10-CM | POA: Diagnosis not present

## 2021-05-25 DIAGNOSIS — M25551 Pain in right hip: Secondary | ICD-10-CM | POA: Diagnosis not present

## 2021-05-31 ENCOUNTER — Telehealth: Payer: Self-pay

## 2021-05-31 NOTE — Telephone Encounter (Signed)
   [facility] Pre-operative Risk Assessment    Patient Name: Tina [name]  DOB: 1950-09-21 MRN: [id number]  HEARTCARE STAFF:  - IMPORTANT!!!!!! Under Visit Info/Reason for Call, type in Other and utilize the format Clearance MM/DD/YY or Clearance TBD. Do not use dashes or single digits. - Please review there is not already an duplicate clearance open for this procedure. - If request is for dental extraction, please clarify the # of teeth to be extracted. - If the patient is currently at the dentist's office, call Pre-Op Callback Staff (MA/nurse) to input urgent request.  - If the patient is not currently in the dentist office, please route to the Pre-Op pool.  Request for surgical clearance:  What type of surgery is being performed? MR arthrogram of her hip.   When is this surgery scheduled? 06/07/2021  What type of clearance is required (medical clearance vs. Pharmacy clearance to hold med vs. Both)? Both  Are there any medications that need to be held prior to surgery and how long? Eliquis. Length of time unspecified.   Practice name and name of physician performing surgery? [facility].   What is the office phone number? [phone]   7.   What is the office fax number? [phone]  8.   Anesthesia type (None, local, MAC, general) ? None noted   Gita Kudo 05/31/2021, 10:54 AM  _________________________________________________________________   (provider comments below)

## 2021-05-31 NOTE — Progress Notes (Signed)
[facility] Summary Report / Loop Recorder 

## 2021-05-31 NOTE — Telephone Encounter (Signed)
Will route to PharmD for rec's re: holding anticoagulation. Richardson Dopp, PA-C    05/31/2021 2:13 PM

## 2021-06-01 NOTE — Telephone Encounter (Signed)
Patient with diagnosis of atrial fibrillation on Eliquis for anticoagulation.    Procedure: MR arthrogram of hip Date of procedure: 06/07/21   CHA2DS2-VASc Score = 2   This indicates a 2.2% annual risk of stroke. The patient's score is based upon: CHF History: 0 HTN History: 0 Diabetes History: 0 Stroke History: 0 Vascular Disease History: 0 Age Score: 1 Gender Score: 1      CrCl 63 Platelet count 224  Per office protocol, patient can hold Eliquis for 2 days prior to procedure.   Patient will not need bridging with Lovenox (enoxaparin) around procedure.  For orthopedic procedures please be sure to resume therapeutic (not prophylactic) dosing.

## 2021-06-01 NOTE — Telephone Encounter (Signed)
   Primary Cardiologist: None  Chart reviewed as part of pre-operative protocol coverage. Given past medical history and time since last visit, based on ACC/AHA guidelines, Tina [name] would be at acceptable risk for the planned procedure without further cardiovascular testing.   Patient with diagnosis of atrial fibrillation on Eliquis for anticoagulation.     Procedure: MR arthrogram of hip Date of procedure: 06/07/21     CHA2DS2-VASc Score = 2   This indicates a 2.2% annual risk of stroke. The patient's score is based upon: CHF History: 0 HTN History: 0 Diabetes History: 0 Stroke History: 0 Vascular Disease History: 0 Age Score: 1 Gender Score: 1       CrCl 63 Platelet count 224   Per office protocol, patient can hold Eliquis for 2 days prior to procedure.   Patient will not need bridging with Lovenox (enoxaparin) around procedure.  For orthopedic procedures please be sure to resume therapeutic (not prophylactic) dosing.   I will route this recommendation to the requesting party via Epic fax function and remove from pre-op pool.  Please call with questions.  [name]. Ioan Landini NP-C    06/01/2021, 10:01 AM [address] Office [phone] Fax [phone]

## 2021-06-05 ENCOUNTER — Other Ambulatory Visit: Payer: Self-pay | Admitting: Cardiology

## 2021-06-07 DIAGNOSIS — M7061 Trochanteric bursitis, right hip: Secondary | ICD-10-CM | POA: Diagnosis not present

## 2021-06-07 DIAGNOSIS — S73191A Other sprain of right hip, initial encounter: Secondary | ICD-10-CM | POA: Diagnosis not present

## 2021-06-07 DIAGNOSIS — M25551 Pain in right hip: Secondary | ICD-10-CM | POA: Diagnosis not present

## 2021-06-07 DIAGNOSIS — M1611 Unilateral primary osteoarthritis, right hip: Secondary | ICD-10-CM | POA: Diagnosis not present

## 2021-06-13 DIAGNOSIS — M79652 Pain in left thigh: Secondary | ICD-10-CM | POA: Diagnosis not present

## 2021-06-13 DIAGNOSIS — M7061 Trochanteric bursitis, right hip: Secondary | ICD-10-CM | POA: Diagnosis not present

## 2021-06-13 DIAGNOSIS — M7062 Trochanteric bursitis, left hip: Secondary | ICD-10-CM | POA: Diagnosis not present

## 2021-06-13 DIAGNOSIS — M25552 Pain in left hip: Secondary | ICD-10-CM | POA: Diagnosis not present

## 2021-06-16 DIAGNOSIS — S73191A Other sprain of right hip, initial encounter: Secondary | ICD-10-CM | POA: Diagnosis not present

## 2021-06-16 DIAGNOSIS — S76012A Strain of muscle, fascia and tendon of left hip, initial encounter: Secondary | ICD-10-CM | POA: Diagnosis not present

## 2021-06-16 DIAGNOSIS — Z6826 Body mass index (BMI) 26.0-26.9, adult: Secondary | ICD-10-CM | POA: Diagnosis not present

## 2021-06-22 LAB — CUP PACEART REMOTE DEVICE CHECK
Date Time Interrogation Session: 20221204230854
Implantable Pulse Generator Implant Date: 20210412

## 2021-06-27 ENCOUNTER — Ambulatory Visit (INDEPENDENT_AMBULATORY_CARE_PROVIDER_SITE_OTHER): Payer: Medicare PPO

## 2021-06-27 DIAGNOSIS — R55 Syncope and collapse: Secondary | ICD-10-CM

## 2021-06-27 DIAGNOSIS — S76012A Strain of muscle, fascia and tendon of left hip, initial encounter: Secondary | ICD-10-CM | POA: Diagnosis not present

## 2021-07-04 ENCOUNTER — Encounter: Payer: Self-pay | Admitting: Cardiology

## 2021-07-04 MED ORDER — METOPROLOL TARTRATE 25 MG PO TABS: 12.5000 mg | ORAL_TABLET | Freq: Two times a day (BID) | ORAL | 3 refills | Status: DC

## 2021-07-05 DIAGNOSIS — H40013 Open angle with borderline findings, low risk, bilateral: Secondary | ICD-10-CM | POA: Diagnosis not present

## 2021-07-07 NOTE — Progress Notes (Signed)
[facility] Summary Report / Loop Recorder 

## 2021-07-20 DIAGNOSIS — M545 Low back pain, unspecified: Secondary | ICD-10-CM | POA: Diagnosis not present

## 2021-07-20 DIAGNOSIS — M7061 Trochanteric bursitis, right hip: Secondary | ICD-10-CM | POA: Diagnosis not present

## 2021-07-20 DIAGNOSIS — M7062 Trochanteric bursitis, left hip: Secondary | ICD-10-CM | POA: Diagnosis not present

## 2021-08-01 ENCOUNTER — Ambulatory Visit (INDEPENDENT_AMBULATORY_CARE_PROVIDER_SITE_OTHER): Payer: Medicare PPO

## 2021-08-01 DIAGNOSIS — R55 Syncope and collapse: Secondary | ICD-10-CM | POA: Diagnosis not present

## 2021-08-01 LAB — CUP PACEART REMOTE DEVICE CHECK
Date Time Interrogation Session: 20230115230151
Implantable Pulse Generator Implant Date: 20210412

## 2021-08-02 DIAGNOSIS — M545 Low back pain, unspecified: Secondary | ICD-10-CM | POA: Diagnosis not present

## 2021-08-02 DIAGNOSIS — M7062 Trochanteric bursitis, left hip: Secondary | ICD-10-CM | POA: Diagnosis not present

## 2021-08-02 DIAGNOSIS — Z1231 Encounter for screening mammogram for malignant neoplasm of breast: Secondary | ICD-10-CM | POA: Diagnosis not present

## 2021-08-02 DIAGNOSIS — M7061 Trochanteric bursitis, right hip: Secondary | ICD-10-CM | POA: Diagnosis not present

## 2021-08-03 DIAGNOSIS — M545 Low back pain, unspecified: Secondary | ICD-10-CM

## 2021-08-03 HISTORY — DX: Low back pain, unspecified: M54.50

## 2021-08-09 DIAGNOSIS — M7062 Trochanteric bursitis, left hip: Secondary | ICD-10-CM | POA: Diagnosis not present

## 2021-08-09 DIAGNOSIS — M545 Low back pain, unspecified: Secondary | ICD-10-CM | POA: Diagnosis not present

## 2021-08-09 DIAGNOSIS — M7061 Trochanteric bursitis, right hip: Secondary | ICD-10-CM | POA: Diagnosis not present

## 2021-08-15 NOTE — Progress Notes (Signed)
[facility] Summary Report / Loop Recorder 

## 2021-08-16 DIAGNOSIS — Z780 Asymptomatic menopausal state: Secondary | ICD-10-CM | POA: Diagnosis not present

## 2021-08-16 DIAGNOSIS — Z0000 Encounter for general adult medical examination without abnormal findings: Secondary | ICD-10-CM | POA: Diagnosis not present

## 2021-08-16 DIAGNOSIS — E7800 Pure hypercholesterolemia, unspecified: Secondary | ICD-10-CM | POA: Diagnosis not present

## 2021-08-16 DIAGNOSIS — G250 Essential tremor: Secondary | ICD-10-CM | POA: Diagnosis not present

## 2021-08-16 DIAGNOSIS — I4891 Unspecified atrial fibrillation: Secondary | ICD-10-CM | POA: Diagnosis not present

## 2021-08-16 DIAGNOSIS — K219 Gastro-esophageal reflux disease without esophagitis: Secondary | ICD-10-CM | POA: Diagnosis not present

## 2021-08-16 DIAGNOSIS — Z6825 Body mass index (BMI) 25.0-25.9, adult: Secondary | ICD-10-CM | POA: Diagnosis not present

## 2021-08-16 DIAGNOSIS — Z79899 Other long term (current) drug therapy: Secondary | ICD-10-CM | POA: Diagnosis not present

## 2021-08-16 DIAGNOSIS — Z131 Encounter for screening for diabetes mellitus: Secondary | ICD-10-CM | POA: Diagnosis not present

## 2021-08-19 DIAGNOSIS — M7062 Trochanteric bursitis, left hip: Secondary | ICD-10-CM | POA: Diagnosis not present

## 2021-08-19 DIAGNOSIS — M545 Low back pain, unspecified: Secondary | ICD-10-CM | POA: Diagnosis not present

## 2021-08-19 DIAGNOSIS — M7061 Trochanteric bursitis, right hip: Secondary | ICD-10-CM | POA: Diagnosis not present

## 2021-08-23 DIAGNOSIS — M545 Low back pain, unspecified: Secondary | ICD-10-CM | POA: Diagnosis not present

## 2021-08-23 DIAGNOSIS — M7062 Trochanteric bursitis, left hip: Secondary | ICD-10-CM | POA: Diagnosis not present

## 2021-08-23 DIAGNOSIS — M7061 Trochanteric bursitis, right hip: Secondary | ICD-10-CM | POA: Diagnosis not present

## 2021-08-29 ENCOUNTER — Other Ambulatory Visit: Payer: Self-pay | Admitting: Cardiology

## 2021-08-29 NOTE — Telephone Encounter (Signed)
Prescription refill request for Eliquis received. Indication:Afib Last office visit:10/22 Scr:0.7 Age: 71 Weight:68.1 kg  Prescription refilled

## 2021-08-30 DIAGNOSIS — M7062 Trochanteric bursitis, left hip: Secondary | ICD-10-CM | POA: Diagnosis not present

## 2021-08-30 DIAGNOSIS — M545 Low back pain, unspecified: Secondary | ICD-10-CM | POA: Diagnosis not present

## 2021-08-30 DIAGNOSIS — M7061 Trochanteric bursitis, right hip: Secondary | ICD-10-CM | POA: Diagnosis not present

## 2021-08-31 DIAGNOSIS — M7061 Trochanteric bursitis, right hip: Secondary | ICD-10-CM | POA: Diagnosis not present

## 2021-08-31 DIAGNOSIS — M7062 Trochanteric bursitis, left hip: Secondary | ICD-10-CM | POA: Diagnosis not present

## 2021-09-05 ENCOUNTER — Ambulatory Visit (INDEPENDENT_AMBULATORY_CARE_PROVIDER_SITE_OTHER): Payer: Medicare PPO

## 2021-09-05 DIAGNOSIS — R55 Syncope and collapse: Secondary | ICD-10-CM | POA: Diagnosis not present

## 2021-09-05 LAB — CUP PACEART REMOTE DEVICE CHECK
Date Time Interrogation Session: 20230219230752
Implantable Pulse Generator Implant Date: 20210412

## 2021-09-12 NOTE — Progress Notes (Signed)
[facility] Summary Report / Loop Recorder 

## 2021-09-23 ENCOUNTER — Encounter: Payer: Self-pay | Admitting: Cardiology

## 2021-09-29 DIAGNOSIS — M810 Age-related osteoporosis without current pathological fracture: Secondary | ICD-10-CM | POA: Diagnosis not present

## 2021-10-06 DIAGNOSIS — E041 Nontoxic single thyroid nodule: Secondary | ICD-10-CM | POA: Diagnosis not present

## 2021-10-06 DIAGNOSIS — Z9109 Other allergy status, other than to drugs and biological substances: Secondary | ICD-10-CM | POA: Diagnosis not present

## 2021-10-10 ENCOUNTER — Ambulatory Visit (INDEPENDENT_AMBULATORY_CARE_PROVIDER_SITE_OTHER): Payer: Medicare PPO

## 2021-10-10 DIAGNOSIS — R55 Syncope and collapse: Secondary | ICD-10-CM

## 2021-10-11 LAB — CUP PACEART REMOTE DEVICE CHECK
Date Time Interrogation Session: 20230326230605
Implantable Pulse Generator Implant Date: 20210412

## 2021-10-13 DIAGNOSIS — E041 Nontoxic single thyroid nodule: Secondary | ICD-10-CM | POA: Diagnosis not present

## 2021-10-17 DIAGNOSIS — L814 Other melanin hyperpigmentation: Secondary | ICD-10-CM | POA: Diagnosis not present

## 2021-10-17 DIAGNOSIS — L578 Other skin changes due to chronic exposure to nonionizing radiation: Secondary | ICD-10-CM | POA: Diagnosis not present

## 2021-10-17 DIAGNOSIS — D225 Melanocytic nevi of trunk: Secondary | ICD-10-CM | POA: Diagnosis not present

## 2021-10-17 DIAGNOSIS — L821 Other seborrheic keratosis: Secondary | ICD-10-CM | POA: Diagnosis not present

## 2021-10-21 NOTE — Progress Notes (Signed)
[facility] Summary Report / Loop Recorder 

## 2021-11-04 ENCOUNTER — Other Ambulatory Visit: Payer: Self-pay

## 2021-11-04 ENCOUNTER — Encounter: Payer: Self-pay | Admitting: Cardiology

## 2021-11-04 ENCOUNTER — Ambulatory Visit: Payer: Medicare PPO | Admitting: Cardiology

## 2021-11-04 VITALS — BP 110/80 | HR 74 | Ht 64.0 in | Wt 151.0 lb

## 2021-11-04 DIAGNOSIS — Z7901 Long term (current) use of anticoagulants: Secondary | ICD-10-CM | POA: Diagnosis not present

## 2021-11-04 DIAGNOSIS — Z79899 Other long term (current) drug therapy: Secondary | ICD-10-CM | POA: Diagnosis not present

## 2021-11-04 DIAGNOSIS — I471 Supraventricular tachycardia: Secondary | ICD-10-CM | POA: Diagnosis not present

## 2021-11-04 DIAGNOSIS — E782 Mixed hyperlipidemia: Secondary | ICD-10-CM | POA: Diagnosis not present

## 2021-11-04 DIAGNOSIS — I480 Paroxysmal atrial fibrillation: Secondary | ICD-10-CM

## 2021-11-04 NOTE — Progress Notes (Signed)
?Cardiology Office Note:   ? ?Date:  11/04/2021  ? ?ID:  Tina [name], DOB [birth year], MRN [id number] ? ?PCP:  Serita Grammes, MD  ?Cardiologist:  Shirlee More, MD   ? ?Referring MD: Serita Grammes, MD  ? ? ?ASSESSMENT:   ? ?1. SVT (supraventricular tachycardia) ([place])   ?2. PAF (paroxysmal atrial fibrillation) ([place])   ?3. Chronic anticoagulation   ?4. High risk medication use   ?5. Mixed hyperlipidemia   ? ?PLAN:   ? ?In order of problems listed above: ? ?Tina [name] continues to do well with her atrial arrhythmia no recurrence of atrial fibrillation or SVT continue low-dose flecainide chest pain and is not on a beta-blocker.  She is taking propranolol for tremor. ?Continue her anticoagulant ?Continue her statin lipids are at target ? ? ?Next appointment: 6 months ? ? ?Medication Adjustments/Labs and Tests Ordered: ?Current medicines are reviewed at length with the patient today.  Concerns regarding medicines are outlined above.  ?No orders of the defined types were placed in this encounter. ? ?No orders of the defined types were placed in this encounter. ? ? ?Chief Complaint  ?Patient presents with  ? Follow-up  ?On flecainide for atrial fibrillation and SVT ? ?History of Present Illness:   ? ?Tina [name] is a 71 y.o. female with a hx of paroxysmal atrial fibrillation and SVT suppressed with flecainide chronic anticoagulation and implantable loop recorder for syncope and mild sinus bradycardia last seen 05/04/2021. ? ?Compliance with diet, lifestyle and medications: Yes ? ?From a cardiology perspective doing well ?She tolerates her anticoagulant without bleeding ?No recurrent atrial fibrillation chest pain shortness of breath edema palpitation or syncope ?She tolerates her statin without muscle pain or weakness ?She has had tendon injuries and is taking quinolones in the past ?I gave her information regarding her pneumococcal vaccination ?Past Medical History:  ?Diagnosis Date  ? Abnormal mammogram of right  breast 10/2016  ? Allergy to pollen 09/26/2018  ? Anxiety and depression 02/01/2011  ? Aortic atherosclerosis ([place])   ? Arthritis   ? right Thumb  ? Atrophic vaginitis 04/28/2020  ? Chest pain in adult   ? Normal MPS 2016  ? Chronic anticoagulation 09/09/2018  ? Complication of anesthesia   ? prolonged sedation  ? Dental crowns present   ? Dupuytren contracture   ? Bilateral hands, mild  ? GERD (gastroesophageal reflux disease)   ? Gestational diabetes   ? history of  ? Grade I diastolic dysfunction 37/62/8315  ? on ECHO  ? Heart palpitations 10/13/2010  ? Overview:  Last Assessment & Plan:  Long hx of same - controlled with beta-blocker  ?orthostatic symptoms in AM with low normal BP - pt will monitor to see if symptoms correlate with BP reading No changes recommended today - reassured re: cards recommendation to take extra 1/2 tab prn symptoms   ? Hepatitis   ? age 20 "infectious"  ? High risk medication use 09/09/2018  ? History of blood transfusion 1982  ? History of cardiomegaly 04/14/2017  ? Noted on CXR  ? History of hepatitis as a child   ? "infectious", per pt.  ? History of hiatal hernia   ? History of pleurisy   ? History of pneumonia   ? Hypercholesterolemia   ? Overview:  Last Assessment & Plan:  On statin Well controlled last check, monitor annually The current medical regimen is effective;  continue present plan and medications.   ? Hyperlipidemia   ? Irregular heartbeat   ?  since age 81, per pt.  ? Itchy eyes 09/15/2019  ? Leg pain, right 03/20/2012  ? Limited joint range of motion   ? cervical spine - difficulty looking up for extended period  ? MR (mitral regurgitation) 05/09/2017  ? Mild, noted on ECHO  ? MVP (mitral valve prolapse)   ? Mild  ? Myalgia due to statin 03/07/2018  ? Orthostatic hypotension   ? Osteoporosis   ? Osteoporosis, post-menopausal   ? hx RLE fib fx 03/2012 following fall DEXA 03/13/13 @ [facility]: -2.5 at spine   ? PAF (paroxysmal atrial fibrillation) ([place]) 09/04/2018  ? Pleuritic chest pain  08/24/2014  ? Pleuritis 04/17/2017  ? PONV (postoperative nausea and vomiting)   ? Postural dizziness 02/01/2011  ? Pulmonary nodule 04/18/2017  ? 5 mm left lower lobe nodule   ? Right thyroid nodule 09/26/2018  ? Seasonal allergies   ? 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  ? SUI (stress urinary incontinence, female)   ? SVT (supraventricular tachycardia) ([place])   ? Syncope 08/21/2018  ? Tinnitus of both ears 04/26/2020  ? TR (tricuspid regurgitation) 05/09/2017  ? Mild, noted on ECHO  ? Urinary tract infectious disease 04/28/2020  ? Wears contact lenses   ? Wears glasses   ? ? ?Past Surgical History:  ?Procedure Laterality Date  ? ANTERIOR CERVICAL DECOMP/DISCECTOMY FUSION  02/03/2002  ? C5-6  ? BLADDER SUSPENSION N/A 04/26/2018  ? Procedure: TRANSVAGINAL TAPE (TVT) PROCEDURE;  Surgeon: Bobbye Charleston, MD;  [facility];  Service: Gynecology;  Laterality: N/A;  ? CESAREAN SECTION    ? x 3  ? COLONOSCOPY  2013  ? several  ? CYSTOCELE REPAIR N/A 04/26/2018  ? Procedure: ANTERIOR REPAIR (CYSTOCELE);  Surgeon: Bobbye Charleston, MD;  Location: [facility];  Service: Gynecology;  Laterality: N/A;  ? CYSTOSCOPY N/A 04/26/2018  ? Procedure: CYSTOSCOPY;  Surgeon: Bobbye Charleston, MD;  Location[facility];  Service: Gynecology;  Laterality: N/A;  ? INGUINAL HERNIA REPAIR Right   ? LAPAROSCOPIC NISSEN FUNDOPLICATION  71/24/5809  ? NISSEN FUNDOPLICATION  98/33/8250  ? redo  ? RADIOACTIVE SEED GUIDED EXCISIONAL BREAST BIOPSY Right 11/08/2016  ? Procedure: RADIOACTIVE SEED GUIDED EXCISIONAL RIGHT BREAST BIOPSY;  Surgeon: Rolm Bookbinder, MD;  Location: [facility];  Service: General;  Laterality: Right;  ? TONSILLECTOMY  1977  ? TUBAL LIGATION    ? UPPER GI ENDOSCOPY    ? x2  ? WISDOM TOOTH EXTRACTION    ? ? ?Current Medications: ?Current Meds  ?Medication Sig  ? acetaminophen (TYLENOL) 325 MG tablet Take 325 mg by mouth as needed for pain  or headache.  ? Calcium Carb-Cholecalciferol 600-200 MG-UNIT TABS Take 1 tablet by mouth daily.  ? celecoxib (CELEBREX) 100 MG capsule TAKE 1 CAPSULE BY MOUTH TWICE A DAY  ? cetirizine (ZYRTEC) 10 MG tablet TAKE 1 TABLET (10 MG TOTAL) BY MOUTH DAILY.  ? Coenzyme Q10 (COQ-10 PO) Take 1 capsule by mouth daily.  ? ELIQUIS 5 MG TABS tablet TAKE 1 TABLET BY MOUTH TWICE A DAY  ? ezetimibe (ZETIA) 10 MG tablet TAKE 1/2 TABLET BY MOUTH DAILY  ? famotidine (PEPCID) 20 MG tablet Take 20 mg by mouth 2 (two) times daily as needed for heartburn or indigestion.  ? flecainide (TAMBOCOR) 50 MG tablet TAKE 1 TABLET BY MOUTH TWICE A DAY  ? FLOVENT HFA 44 MCG/ACT inhaler Inhale 1-2 puffs into the lungs daily.  ? fluticasone (FLONASE) 50 MCG/ACT  nasal spray Place 2 sprays into both nostrils in the morning and at bedtime.   ? Multiple Vitamins-Minerals (CENTRUM SILVER PO) Take 1 tablet by mouth daily.   ? Omega-3 Fatty Acids (FISH OIL PO) Take 1 capsule by mouth daily.   ? pravastatin (PRAVACHOL) 20 MG tablet TAKE 1 TABLET BY MOUTH EVERY DAY IN THE EVENING  ? propranolol (INDERAL) 10 MG tablet Take 10 mg by mouth daily.  ?  ? ?Allergies:   Codeine, Erythromycin, Simvastatin, and Other  ? ?Social History  ? ?Socioeconomic History  ? Marital status: Married  ?  Spouse name: Not on file  ? Number of children: Not on file  ? Years of education: Not on file  ? Highest education level: Not on file  ?Occupational History  ? Not on file  ?Tobacco Use  ? Smoking status: Never  ? Smokeless tobacco: Never  ?Vaping Use  ? Vaping Use: Never used  ?Substance and Sexual Activity  ? Alcohol use: No  ? Drug use: No  ? Sexual activity: Not on file  ?Other Topics Concern  ? Not on file  ?Social History Narrative  ? Retired Financial risk analyst  ? Master's degree educatation  ? Married, lives with spouse -  ? ?Social Determinants of Health  ? ?Financial Resource Strain: Not on file  ?Food Insecurity: Not on file  ?Transportation Needs: Not on file   ?Physical Activity: Not on file  ?Stress: Not on file  ?Social Connections: Not on file  ?  ? ?Family History: ?The patient's family history includes Alzheimer's disease in her father and mother; [place]

## 2021-11-04 NOTE — Patient Instructions (Signed)
Medication Instructions:  ?Your physician recommends that you continue on your current medications as directed. Please refer to the Current Medication list given to you today. ? ?*If you need a refill on your cardiac medications before your next appointment, please call your pharmacy* ? ? ?Lab Work: ?None ?If you have labs (blood work) drawn today and your tests are completely normal, you will receive your results only by: ?MyChart Message (if you have MyChart) OR ?A paper copy in the mail ?If you have any lab test that is abnormal or we need to change your treatment, we will call you to review the results. ? ? ?Testing/Procedures: ?None ? ? ?Follow-Up: ?At [facility], you and your health needs are our priority.  As part of our continuing mission to provide you with exceptional heart care, we have created designated Provider Care Teams.  These Care Teams include your primary Cardiologist (physician) and Advanced Practice Providers (APPs -  Physician Assistants and Nurse Practitioners) who all work together to provide you with the care you need, when you need it. ? ?We recommend signing up for the patient portal called "MyChart".  Sign up information is provided on this After Visit Summary.  MyChart is used to connect with patients for Virtual Visits (Telemedicine).  Patients are able to view lab/test results, encounter notes, upcoming appointments, etc.  Non-urgent messages can be sent to your provider as well.   ?To learn more about what you can do with MyChart, go to [email or web address].   ? ?Your next appointment:   ?6 month(s) ? ?The format for your next appointment:   ?In Person ? ?Provider:   ?Dr. [place]:1}  ? ? ?Other Instructions ?None ? ?Important Information About Sugar ? ? ? ? ? ? ?

## 2021-11-13 LAB — CUP PACEART REMOTE DEVICE CHECK
Date Time Interrogation Session: 20230428230122
Implantable Pulse Generator Implant Date: 20210412

## 2021-11-14 ENCOUNTER — Ambulatory Visit (INDEPENDENT_AMBULATORY_CARE_PROVIDER_SITE_OTHER): Payer: Medicare PPO

## 2021-11-14 DIAGNOSIS — R55 Syncope and collapse: Secondary | ICD-10-CM | POA: Diagnosis not present

## 2021-11-29 NOTE — Progress Notes (Signed)
[facility] Summary Report / Loop Recorder 

## 2021-12-02 ENCOUNTER — Other Ambulatory Visit: Payer: Self-pay | Admitting: Cardiology

## 2021-12-06 ENCOUNTER — Other Ambulatory Visit: Payer: Self-pay | Admitting: Cardiology

## 2021-12-08 ENCOUNTER — Telehealth: Payer: Self-pay

## 2021-12-08 NOTE — Telephone Encounter (Signed)
   Primary Cardiologist: Shirlee More, MD  Chart reviewed as part of pre-operative protocol coverage. Given past medical history and time since last visit, based on ACC/AHA guidelines, Tina [name] would be at acceptable risk for the planned procedure without further cardiovascular testing.   Patient with diagnosis of afib on Eliquis for anticoagulation.     Procedure: colonoscopy Date of procedure: TBD   CHA2DS2-VASc Score = 3  This indicates a 3.2% annual risk of stroke. The patient's score is based upon: CHF History: 0 HTN History: 0 Diabetes History: 0 Stroke History: 0 Vascular Disease History: 1 Age Score: 1 Gender Score: 1   CrCl 36m/min Platelet count 240K   Per office protocol, patient can hold Eliquis for 1-2 days prior to procedure.  I will route this recommendation to the requesting party via Epic fax function and remove from pre-op pool.  Please call with questions.  JJossie Ng Sherion Dooly NP-C    12/08/2021, 2:20 PM [facility] [address] Office [phone] [phone]

## 2021-12-08 NOTE — Telephone Encounter (Signed)
   Pre-operative Risk Assessment    Patient Name: Tina [name]  DOB: 07-03-1951 MRN: [id number]      Request for Surgical Clearance    Procedure:   Colonoscopy  Date of Surgery:  Clearance TBD                                 Surgeon:  Dr. Rolm Gala Group or Practice Name:  [facility] Phone number:  [phone] Fax number:  [phone]   Type of Clearance Requested:   - Pharmacy:  Hold Apixaban (Eliquis) Eliquis   Type of Anesthesia:   Propofol   Additional requests/questions:    Daylene Katayama   12/08/2021, 7:46 AM

## 2021-12-08 NOTE — Telephone Encounter (Signed)
Patient with diagnosis of afib on Eliquis for anticoagulation.    Procedure: colonoscopy Date of procedure: TBD  CHA2DS2-VASc Score = 3  This indicates a 3.2% annual risk of stroke. The patient's score is based upon: CHF History: 0 HTN History: 0 Diabetes History: 0 Stroke History: 0 Vascular Disease History: 1 Age Score: 1 Gender Score: 1   CrCl 105m/min Platelet count 240K  Per office protocol, patient can hold Eliquis for 1-2 days prior to procedure.

## 2021-12-19 ENCOUNTER — Ambulatory Visit (INDEPENDENT_AMBULATORY_CARE_PROVIDER_SITE_OTHER): Payer: Medicare PPO

## 2021-12-19 DIAGNOSIS — R55 Syncope and collapse: Secondary | ICD-10-CM | POA: Diagnosis not present

## 2021-12-19 LAB — CUP PACEART REMOTE DEVICE CHECK
Date Time Interrogation Session: 20230531230228
Implantable Pulse Generator Implant Date: 20210412

## 2022-01-05 NOTE — Progress Notes (Signed)
[facility] Summary Report / Loop Recorder 

## 2022-01-13 DIAGNOSIS — R300 Dysuria: Secondary | ICD-10-CM | POA: Diagnosis not present

## 2022-01-21 LAB — CUP PACEART REMOTE DEVICE CHECK
Date Time Interrogation Session: 20230703230456
Implantable Pulse Generator Implant Date: 20210412

## 2022-01-23 ENCOUNTER — Ambulatory Visit (INDEPENDENT_AMBULATORY_CARE_PROVIDER_SITE_OTHER): Payer: Medicare PPO

## 2022-01-23 DIAGNOSIS — R55 Syncope and collapse: Secondary | ICD-10-CM | POA: Diagnosis not present

## 2022-02-07 ENCOUNTER — Other Ambulatory Visit: Payer: Self-pay | Admitting: Gastroenterology

## 2022-02-08 DIAGNOSIS — L820 Inflamed seborrheic keratosis: Secondary | ICD-10-CM | POA: Diagnosis not present

## 2022-02-08 DIAGNOSIS — C44519 Basal cell carcinoma of skin of other part of trunk: Secondary | ICD-10-CM | POA: Diagnosis not present

## 2022-02-10 DIAGNOSIS — I4891 Unspecified atrial fibrillation: Secondary | ICD-10-CM | POA: Diagnosis not present

## 2022-02-10 DIAGNOSIS — N3001 Acute cystitis with hematuria: Secondary | ICD-10-CM | POA: Diagnosis not present

## 2022-02-10 DIAGNOSIS — K219 Gastro-esophageal reflux disease without esophagitis: Secondary | ICD-10-CM | POA: Diagnosis not present

## 2022-02-10 DIAGNOSIS — G250 Essential tremor: Secondary | ICD-10-CM | POA: Diagnosis not present

## 2022-02-10 DIAGNOSIS — Z6825 Body mass index (BMI) 25.0-25.9, adult: Secondary | ICD-10-CM | POA: Diagnosis not present

## 2022-02-15 ENCOUNTER — Other Ambulatory Visit: Payer: Self-pay | Admitting: Cardiology

## 2022-02-15 ENCOUNTER — Other Ambulatory Visit: Payer: Self-pay

## 2022-02-15 DIAGNOSIS — I480 Paroxysmal atrial fibrillation: Secondary | ICD-10-CM

## 2022-02-15 MED ORDER — APIXABAN 5 MG PO TABS: 5.0000 mg | ORAL_TABLET | Freq: Two times a day (BID) | ORAL | 1 refills | Status: DC

## 2022-02-15 NOTE — Telephone Encounter (Signed)
Prescription refill request for Eliquis received. Indication: Afib  Last office visit:11/04/21 ([place])  Scr: 0.79 (05/04/21)  Age: 71 Weight: 68.5kg  Appropriate dose and refill sent to requested pharmacy.

## 2022-02-20 ENCOUNTER — Other Ambulatory Visit: Payer: Self-pay | Admitting: Cardiology

## 2022-02-20 NOTE — Progress Notes (Signed)
[facility] Summary Report / Loop Recorder 

## 2022-02-23 LAB — CUP PACEART REMOTE DEVICE CHECK
Date Time Interrogation Session: 20230805230749
Implantable Pulse Generator Implant Date: 20210412

## 2022-03-10 ENCOUNTER — Other Ambulatory Visit: Payer: Self-pay | Admitting: Cardiology

## 2022-03-15 DIAGNOSIS — Z01419 Encounter for gynecological examination (general) (routine) without abnormal findings: Secondary | ICD-10-CM | POA: Diagnosis not present

## 2022-03-15 DIAGNOSIS — N390 Urinary tract infection, site not specified: Secondary | ICD-10-CM | POA: Diagnosis not present

## 2022-04-03 ENCOUNTER — Ambulatory Visit (INDEPENDENT_AMBULATORY_CARE_PROVIDER_SITE_OTHER): Payer: Medicare PPO

## 2022-04-03 DIAGNOSIS — R55 Syncope and collapse: Secondary | ICD-10-CM

## 2022-04-03 LAB — CUP PACEART REMOTE DEVICE CHECK
Date Time Interrogation Session: 20230917230258
Implantable Pulse Generator Implant Date: 20210412

## 2022-04-04 DIAGNOSIS — H00022 Hordeolum internum right lower eyelid: Secondary | ICD-10-CM | POA: Diagnosis not present

## 2022-04-17 NOTE — Progress Notes (Signed)
[facility] Summary Report / Loop Recorder 

## 2022-04-24 DIAGNOSIS — J069 Acute upper respiratory infection, unspecified: Secondary | ICD-10-CM | POA: Diagnosis not present

## 2022-04-24 DIAGNOSIS — R051 Acute cough: Secondary | ICD-10-CM | POA: Diagnosis not present

## 2022-04-26 ENCOUNTER — Encounter (HOSPITAL_COMMUNITY): Payer: Self-pay | Admitting: Gastroenterology

## 2022-05-01 DIAGNOSIS — J209 Acute bronchitis, unspecified: Secondary | ICD-10-CM | POA: Diagnosis not present

## 2022-05-03 ENCOUNTER — Ambulatory Visit (HOSPITAL_COMMUNITY): Admission: RE | Admit: 2022-05-03 | Payer: Medicare PPO | Source: Ambulatory Visit | Admitting: Gastroenterology

## 2022-05-03 SURGERY — COLONOSCOPY WITH PROPOFOL
Anesthesia: Monitor Anesthesia Care

## 2022-05-04 NOTE — Progress Notes (Signed)
Cardiology Office Note:    Date:  05/05/2022   ID:  [name], DOB Feb 10, 1951, MRN [id number]  PCP:  Serita Grammes, MD  Cardiologist:  Shirlee More, MD    Referring MD: Serita Grammes, MD    ASSESSMENT:    1. PAF (paroxysmal atrial fibrillation) ([place])   2. SVT (supraventricular tachycardia)   3. High risk medication use   4. Chronic anticoagulation    PLAN:    In order of problems listed above:  Appears to be maintaining sinus rhythm she has implanted loop and will be interested in seeing her next download if she had breakthrough atrial fibrillation in the setting of COVID.  She will continue flecainide check level today with breakthrough and continue her current anticoagulant In terms of COVID I think what she has is retained secretions have asked her to start using Mucinex over-the-counter and to purchase a flutter valve online at [facility] if unimproved by next week she will see her PCP at times nebulizer with saline is helpful in these individuals.   Next appointment: 6 months   Medication Adjustments/Labs and Tests Ordered: Current medicines are reviewed at length with the patient today.  Concerns regarding medicines are outlined above.  No orders of the defined types were placed in this encounter.  No orders of the defined types were placed in this encounter.  Follow-up atrial fibrillation   History of Present Illness:    Tina [name] is a 71 y.o. female with a hx of PAF and SVT suppressed with flecainide chronic anticoagulation she has an implantable loop recorder for syncope and has had mild sinus bradycardia last seen 11/04/2021.  Compliance with diet, lifestyle and medications: Yes   she is not doing well with recurrent second time COVID several trips to urgent care was started on Zithromax for superimposed bronchitis is having fits of coughing and productive sputum. She is had some rapid heart rhythm but not severe or sustained Months ago she had  extensive ecchymosis left hip down below the knee after injection orthopedics No chest pain or syncope Past Medical History:  Diagnosis Date   Abnormal mammogram of right breast 10/2016   Allergy to pollen 09/26/2018   Anxiety and depression 02/01/2011   Aortic atherosclerosis (HCC)    Arthritis    right Thumb   Atrophic vaginitis 04/28/2020   Chest pain in adult    Normal MPS 2016   Chronic anticoagulation 0/94/7096   Complication of anesthesia    prolonged sedation   Dental crowns present    Dupuytren contracture    Bilateral hands, mild   GERD (gastroesophageal reflux disease)    Gestational diabetes    history of   Grade I diastolic dysfunction 28/36/6294   on ECHO   Heart palpitations 10/13/2010   Overview:  Last Assessment & Plan:  Long hx of same - controlled with beta-blocker  ?orthostatic symptoms in AM with low normal BP - pt will monitor to see if symptoms correlate with BP reading No changes recommended today - reassured re: cards recommendation to take extra 1/2 tab prn symptoms    Hepatitis    age 46 "infectious"   High risk medication use 09/09/2018   History of blood transfusion 1982   History of cardiomegaly 04/14/2017   Noted on CXR   History of hepatitis as a child    "infectious", per pt.   History of hiatal hernia    History of pleurisy    History of pneumonia  Hypercholesterolemia    Overview:  Last Assessment & Plan:  On statin Well controlled last check, monitor annually The current medical regimen is effective;  continue present plan and medications.    Hyperlipidemia    Irregular heartbeat    since age 48, per pt.   Itchy eyes 09/15/2019   Leg pain, right 03/20/2012   Limited joint range of motion    cervical spine - difficulty looking up for extended period   MR (mitral regurgitation) 05/09/2017   Mild, noted on ECHO   MVP (mitral valve prolapse)    Mild   Myalgia due to statin 03/07/2018   Orthostatic hypotension    Osteoporosis     Osteoporosis, post-menopausal    hx RLE fib fx 03/2012 following fall DEXA 03/13/13 @ [facility]: -2.5 at spine    PAF (paroxysmal atrial fibrillation) ([place]) 09/04/2018   Pleuritic chest pain 08/24/2014   Pleuritis 04/17/2017   PONV (postoperative nausea and vomiting)    Postural dizziness 02/01/2011   Pulmonary nodule 04/18/2017   5 mm left lower lobe nodule    Right thyroid nodule 09/26/2018   Seasonal allergies    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   SUI (stress urinary incontinence, female)    SVT (supraventricular tachycardia)    Syncope 08/21/2018   Tinnitus of both ears 04/26/2020   TR (tricuspid regurgitation) 05/09/2017   Mild, noted on ECHO   Urinary tract infectious disease 04/28/2020   Wears contact lenses    Wears glasses     Past Surgical History:  Procedure Laterality Date   ANTERIOR CERVICAL DECOMP/DISCECTOMY FUSION  02/03/2002   C5-6   BLADDER SUSPENSION N/A 04/26/2018   Procedure: TRANSVAGINAL TAPE (TVT) PROCEDURE;  Surgeon: Bobbye Charleston, MD;  Location: [place];  Service: Gynecology;  Laterality: N/A;   CESAREAN SECTION     x 3   COLONOSCOPY  2013   several   CYSTOCELE REPAIR N/A 04/26/2018   Procedure: ANTERIOR REPAIR (CYSTOCELE);  Surgeon: Bobbye Charleston, MD;  [facility];  Service: Gynecology;  Laterality: N/A;   CYSTOSCOPY N/A 04/26/2018   Procedure: CYSTOSCOPY;  Surgeon: Bobbye Charleston, MD;  Location: [facility];  Service: Gynecology;  Laterality: N/A;   INGUINAL HERNIA REPAIR Right    LAPAROSCOPIC NISSEN FUNDOPLICATION  81/85/6314   NISSEN FUNDOPLICATION  97/08/6376   redo   RADIOACTIVE SEED GUIDED EXCISIONAL BREAST BIOPSY Right 11/08/2016   Procedure: RADIOACTIVE SEED GUIDED EXCISIONAL RIGHT BREAST BIOPSY;  Surgeon: Rolm Bookbinder, MD;  Location: [place];  Service: General;  Laterality: Right;   TONSILLECTOMY  1977   TUBAL LIGATION     UPPER GI  ENDOSCOPY     x2   WISDOM TOOTH EXTRACTION      Current Medications: Current Meds  Medication Sig   acetaminophen (TYLENOL) 325 MG tablet Take 325 mg by mouth every 6 (six) hours as needed for pain or headache.   apixaban (ELIQUIS) 5 MG TABS tablet Take 1 tablet (5 mg total) by mouth 2 (two) times daily.   calcium-vitamin D (OSCAL WITH D) 500-5 MG-MCG tablet Take 1 tablet by mouth daily. 650 mg   cetirizine (ZYRTEC) 10 MG tablet TAKE 1 TABLET (10 MG TOTAL) BY MOUTH DAILY.   Coenzyme Q10 (COQ-10 PO) Take 200 mg by mouth in the morning.   denosumab (PROLIA) 60 MG/ML SOSY injection Inject 60 mg into the skin every 6 (six) months.   ezetimibe (ZETIA) 10 MG tablet TAKE 1/2  TABLET BY MOUTH EVERY DAY   famotidine (PEPCID) 20 MG tablet Take 20 mg by mouth in the morning and at bedtime.   flecainide (TAMBOCOR) 50 MG tablet TAKE 1 TABLET BY MOUTH TWICE A DAY   meclizine (ANTIVERT) 25 MG tablet Take 25 mg by mouth daily as needed for dizziness.   MEGARED OMEGA-3 KRILL OIL PO Take 1 capsule by mouth in the morning.   montelukast (SINGULAIR) 10 MG tablet Take 10 mg by mouth at bedtime.   Multiple Vitamin (MULTIVITAMIN WITH MINERALS) TABS tablet Take 1 tablet by mouth in the morning.   pravastatin (PRAVACHOL) 20 MG tablet TAKE 1 TABLET BY MOUTH EVERY DAY IN THE EVENING   propranolol (INDERAL) 10 MG tablet Take 10 mg by mouth 2 (two) times daily.     Allergies:   Codeine, E.e.s. 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   Married, lives with spouse -   Social Determinants of Radio broadcast assistant Strain: Not on file  Food Insecurity: Not on file  Transportation Needs: Not on file  Physical Activity: Not on file  Stress: Not on file  Social Connections: Not on file     Family History: The patient's family history includes Alzheimer's disease in her father and mother; Congestive Heart Failure in her father; Heart attack in her father, maternal grandfather, maternal grandmother, paternal grandfather, and paternal grandmother; Heart disease in her father; Heart failure in her father; Hyperlipidemia in her father; Prostate cancer in her father; Stroke in her paternal grandmother. ROS:   Please see the history of present illness.    All other systems reviewed and are negative.  EKGs/Labs/Other Studies Reviewed:    The following studies were reviewed today:  EKG:  EKG ordered today and personally reviewed.  The ekg ordered today demonstrates sinus rhythm right bundle branch block  Recent Labs: No results found for requested labs within last 365 days.  Recent Lipid Panel    Component Value Date/Time   CHOL 164 05/04/2021 1325   TRIG 190 (H) 05/04/2021 1325   HDL 44 05/04/2021 1325   CHOLHDL 3.7 05/04/2021 1325   CHOLHDL 5 03/04/2015 0824   VLDL 20.8 03/04/2015 0824   LDLCALC 88 05/04/2021 1325    Physical Exam:    VS:  BP 110/72 (BP Location: Right Arm, Patient Position: Sitting, Cuff Size: Normal)   Pulse 84   Ht '5\' 4"'$  (1.626 m)   Wt 139 lb 3.2 oz (63.1 kg)   SpO2 98%   BMI 23.89 kg/m     Wt Readings from Last 3 Encounters:  05/05/22 139 lb 3.2 oz (63.1 kg)  11/04/21 151 lb (68.5 kg)  05/04/21 150 lb 3.2 oz (68.1 kg)     GEN: She looks very weak and fatigued well nourished,  well developed in no acute distress HEENT: Normal NECK: No JVD; No carotid  bruits LYMPHATICS: No lymphadenopathy CARDIAC: RRR, no murmurs, rubs, gallops RESPIRATORY:  Clear to auscultation without rales, wheezing or rhonchi  ABDOMEN: Soft, non-tender, non-distended MUSCULOSKELETAL:  No edema; No deformity  SKIN: Warm and dry NEUROLOGIC:  Alert and oriented x 3 PSYCHIATRIC:  Normal affect    Signed, Shirlee More, MD  05/05/2022 9:23 AM    [place]

## 2022-05-05 ENCOUNTER — Ambulatory Visit: Payer: Medicare PPO | Attending: Cardiology | Admitting: Cardiology

## 2022-05-05 ENCOUNTER — Telehealth: Payer: Self-pay

## 2022-05-05 ENCOUNTER — Encounter: Payer: Self-pay | Admitting: Cardiology

## 2022-05-05 VITALS — BP 110/72 | HR 84 | Ht 64.0 in | Wt 139.2 lb

## 2022-05-05 DIAGNOSIS — I480 Paroxysmal atrial fibrillation: Secondary | ICD-10-CM

## 2022-05-05 DIAGNOSIS — Z7901 Long term (current) use of anticoagulants: Secondary | ICD-10-CM | POA: Diagnosis not present

## 2022-05-05 DIAGNOSIS — I471 Supraventricular tachycardia, unspecified: Secondary | ICD-10-CM | POA: Diagnosis not present

## 2022-05-05 DIAGNOSIS — Z79899 Other long term (current) drug therapy: Secondary | ICD-10-CM

## 2022-05-05 NOTE — Patient Instructions (Addendum)
If not improved next week, see PCP for saline nebulizer.  Use over the counter Mucinex once or twice daily.  Use Robitussin DM over the counter.  [email or web address].[email or web address]  Medication Instructions:  No medication changes. *If you need a refill on your cardiac medications before your next appointment, please call your pharmacy*   Lab Work: Your physician recommends that you have a CBC, CMP and flecainide level done today in the office.  If you have labs (blood work) drawn today and your tests are completely normal, you will receive your results only by: [place] (if you have MyChart) OR A paper copy in the mail If you have any lab test that is abnormal or we need to change your treatment, we will call you to review the results.   Testing/Procedures: None ordered   Follow-Up: At [facility], you and your health needs are our priority.  As part of our continuing mission to provide you with exceptional heart care, we have created designated Provider Care Teams.  These Care Teams include your primary Cardiologist (physician) and Advanced Practice Providers (APPs -  Physician Assistants and Nurse Practitioners) who all work together to provide you with the care you need, when you need it.  We recommend signing up for the patient portal called "MyChart".  Sign up information is provided on this After Visit Summary.  MyChart is used to connect with patients for Virtual Visits (Telemedicine).  Patients are able to view lab/test results, encounter notes, upcoming appointments, etc.  Non-urgent messages can be sent to your provider as well.   To learn more about what you can do with MyChart, go to [email or web address].    Your next appointment:   6 month(s)  The format for your next appointment:   In Person  Provider:   Shirlee More, MD   Other Instructions NA

## 2022-05-05 NOTE — Telephone Encounter (Signed)
Patient had labs drawn after today's appointment with Dr Shirlee More. Patient was brought back to exam room and placed in supine position BP: 124/87 HR: 86.  After letting patient rest for 15 min patient set up and was given ginger ale soda to sip. States she was feeling much better at this time. Rechecked BP: 108/73 HR: 84. Patient stood up BP 112/74 HR:86. Patient verbalized she was feeling a lot better thank [place] for assisting her. Patient did state that her husband drove her to the office and is waiting for her in the lobby.  Dr Bettina Gavia made aware that patient was much better and was released.

## 2022-05-06 LAB — COMPREHENSIVE METABOLIC PANEL
ALT: 38 IU/L — ABNORMAL HIGH (ref 0–32)
AST: 22 IU/L (ref 0–40)
Albumin/Globulin Ratio: 1.6 (ref 1.2–2.2)
Albumin: 4.2 g/dL (ref 3.8–4.8)
Alkaline Phosphatase: 78 IU/L (ref 44–121)
BUN/Creatinine Ratio: 31 — ABNORMAL HIGH (ref 12–28)
BUN: 27 mg/dL (ref 8–27)
Bilirubin Total: 0.7 mg/dL (ref 0.0–1.2)
CO2: 24 mmol/L (ref 20–29)
Calcium: 9.1 mg/dL (ref 8.7–10.3)
Chloride: 102 mmol/L (ref 96–106)
Creatinine, Ser: 0.88 mg/dL (ref 0.57–1.00)
Globulin, Total: 2.6 g/dL (ref 1.5–4.5)
Glucose: 86 mg/dL (ref 70–99)
Potassium: 3.9 mmol/L (ref 3.5–5.2)
Sodium: 140 mmol/L (ref 134–144)
Total Protein: 6.8 g/dL (ref 6.0–8.5)
eGFR: 70 mL/min/{1.73_m2} (ref >59)

## 2022-05-06 LAB — CBC
Hematocrit: 47.7 % — ABNORMAL HIGH (ref 34.0–46.6)
Hemoglobin: 16.2 g/dL — ABNORMAL HIGH (ref 11.1–15.9)
MCH: 30.4 pg (ref 26.6–33.0)
MCHC: 34 g/dL (ref 31.5–35.7)
MCV: 90 fL (ref 79–97)
Platelets: 346 10*3/uL (ref 150–450)
RBC: 5.33 x10E6/uL — ABNORMAL HIGH (ref 3.77–5.28)
RDW: 13 % (ref 11.7–15.4)
WBC: 11 10*3/uL — ABNORMAL HIGH (ref 3.4–10.8)

## 2022-05-08 ENCOUNTER — Ambulatory Visit (INDEPENDENT_AMBULATORY_CARE_PROVIDER_SITE_OTHER): Payer: Medicare PPO

## 2022-05-08 DIAGNOSIS — R55 Syncope and collapse: Secondary | ICD-10-CM | POA: Diagnosis not present

## 2022-05-09 LAB — CUP PACEART REMOTE DEVICE CHECK
Date Time Interrogation Session: 20231020230458
Implantable Pulse Generator Implant Date: 20210412

## 2022-05-10 DIAGNOSIS — M810 Age-related osteoporosis without current pathological fracture: Secondary | ICD-10-CM | POA: Diagnosis not present

## 2022-05-19 LAB — FLECAINIDE LEVEL: Flecainide: 0.21 ug/ml (ref 0.20–1.00)

## 2022-06-02 DIAGNOSIS — Z23 Encounter for immunization: Secondary | ICD-10-CM | POA: Diagnosis not present

## 2022-06-02 NOTE — Progress Notes (Signed)
[facility] Summary Report / Loop Recorder 

## 2022-06-12 ENCOUNTER — Ambulatory Visit (INDEPENDENT_AMBULATORY_CARE_PROVIDER_SITE_OTHER): Payer: Medicare PPO

## 2022-06-12 DIAGNOSIS — R55 Syncope and collapse: Secondary | ICD-10-CM

## 2022-06-13 LAB — CUP PACEART REMOTE DEVICE CHECK
Date Time Interrogation Session: 20231126230706
Implantable Pulse Generator Implant Date: 20210412

## 2022-06-16 ENCOUNTER — Other Ambulatory Visit: Payer: Self-pay | Admitting: Gastroenterology

## 2022-06-24 DIAGNOSIS — J209 Acute bronchitis, unspecified: Secondary | ICD-10-CM | POA: Diagnosis not present

## 2022-06-24 DIAGNOSIS — R509 Fever, unspecified: Secondary | ICD-10-CM | POA: Diagnosis not present

## 2022-06-29 DIAGNOSIS — R0981 Nasal congestion: Secondary | ICD-10-CM | POA: Diagnosis not present

## 2022-06-29 DIAGNOSIS — J309 Allergic rhinitis, unspecified: Secondary | ICD-10-CM | POA: Diagnosis not present

## 2022-06-29 DIAGNOSIS — R051 Acute cough: Secondary | ICD-10-CM | POA: Diagnosis not present

## 2022-06-30 ENCOUNTER — Telehealth: Payer: Self-pay

## 2022-06-30 NOTE — Patient Outreach (Signed)
  Care Coordination   Initial Visit Note   06/30/2022 Name: CAYLIN NASS MRN: [id number] DOB: 10-Nov-1950  SIBONEY REQUEJO is a 71 y.o. year old female who sees Serita Grammes, MD for primary care. I spoke with  [name] by phone today.  What matters to the patients health and wellness today?  Placed call to patient to explain and offer [facility] care coordination program. Patient reports she has had 2 colds and has been to the urgent care. Reports she is following up with PCP. Denies any needs at this time from me.  Provided my name and contact information if she changes her mind.     SDOH assessments and interventions completed:  No     Care Coordination Interventions:  No, not indicated   Follow up plan: No further intervention required.   Encounter Outcome:  Pt. Refused   Tomasa Rand, RN, BSN, CEN [facility] [employer] [phone]

## 2022-07-03 ENCOUNTER — Telehealth: Payer: Self-pay | Admitting: Cardiology

## 2022-07-03 NOTE — Telephone Encounter (Signed)
Patient states she has been very sick with pneumonia and bronchitis. She states that she has taken a series of medications, but has not gotten any better. She would like to know if Dr. Bettina Gavia has recommendations for her.

## 2022-07-03 NOTE — Telephone Encounter (Signed)
Spoke with pt. She stated that she has been to urgent care 2 times and has any appt to follow up with her PCP in Jan. Advised per Dr. Bettina Gavia to go back to urgent care if she is not getting any better. Pt agreed and had no further questions.

## 2022-07-18 ENCOUNTER — Ambulatory Visit (INDEPENDENT_AMBULATORY_CARE_PROVIDER_SITE_OTHER): Payer: Medicare PPO

## 2022-07-18 DIAGNOSIS — R55 Syncope and collapse: Secondary | ICD-10-CM

## 2022-07-18 LAB — CUP PACEART REMOTE DEVICE CHECK
Date Time Interrogation Session: 20240101230540
Implantable Pulse Generator Implant Date: 20210412

## 2022-07-24 NOTE — Progress Notes (Signed)
[facility] Summary Report / Loop Recorder 

## 2022-08-01 ENCOUNTER — Encounter (HOSPITAL_COMMUNITY): Payer: Self-pay | Admitting: Gastroenterology

## 2022-08-08 ENCOUNTER — Encounter (HOSPITAL_COMMUNITY)
Admission: RE | Disposition: A | Discharge status: Home / Self Care | Payer: Self-pay | Source: Home / Self Care | Attending: Gastroenterology

## 2022-08-08 ENCOUNTER — Ambulatory Visit (HOSPITAL_COMMUNITY): Payer: Medicare PPO | Admitting: Anesthesiology

## 2022-08-08 ENCOUNTER — Ambulatory Visit (HOSPITAL_COMMUNITY)
Admission: RE | Admit: 2022-08-08 | Discharge: 2022-08-08 | Disposition: A | Discharge status: Home / Self Care | Payer: Medicare PPO | Attending: Gastroenterology | Admitting: Gastroenterology

## 2022-08-08 ENCOUNTER — Ambulatory Visit (HOSPITAL_BASED_OUTPATIENT_CLINIC_OR_DEPARTMENT_OTHER): Payer: Medicare PPO | Admitting: Anesthesiology

## 2022-08-08 ENCOUNTER — Encounter (HOSPITAL_COMMUNITY): Payer: Self-pay | Admitting: Gastroenterology

## 2022-08-08 ENCOUNTER — Other Ambulatory Visit: Payer: Self-pay

## 2022-08-08 DIAGNOSIS — M1990 Unspecified osteoarthritis, unspecified site: Secondary | ICD-10-CM | POA: Diagnosis not present

## 2022-08-08 DIAGNOSIS — B159 Hepatitis A without hepatic coma: Secondary | ICD-10-CM | POA: Diagnosis not present

## 2022-08-08 DIAGNOSIS — K635 Polyp of colon: Secondary | ICD-10-CM | POA: Insufficient documentation

## 2022-08-08 DIAGNOSIS — K5730 Diverticulosis of large intestine without perforation or abscess without bleeding: Secondary | ICD-10-CM

## 2022-08-08 DIAGNOSIS — K640 First degree hemorrhoids: Secondary | ICD-10-CM | POA: Diagnosis not present

## 2022-08-08 DIAGNOSIS — I4891 Unspecified atrial fibrillation: Secondary | ICD-10-CM | POA: Diagnosis not present

## 2022-08-08 DIAGNOSIS — J449 Chronic obstructive pulmonary disease, unspecified: Secondary | ICD-10-CM

## 2022-08-08 DIAGNOSIS — E785 Hyperlipidemia, unspecified: Secondary | ICD-10-CM | POA: Insufficient documentation

## 2022-08-08 DIAGNOSIS — K219 Gastro-esophageal reflux disease without esophagitis: Secondary | ICD-10-CM | POA: Insufficient documentation

## 2022-08-08 DIAGNOSIS — D125 Benign neoplasm of sigmoid colon: Secondary | ICD-10-CM | POA: Diagnosis not present

## 2022-08-08 DIAGNOSIS — Z7901 Long term (current) use of anticoagulants: Secondary | ICD-10-CM | POA: Insufficient documentation

## 2022-08-08 DIAGNOSIS — F418 Other specified anxiety disorders: Secondary | ICD-10-CM | POA: Diagnosis not present

## 2022-08-08 DIAGNOSIS — E119 Type 2 diabetes mellitus without complications: Secondary | ICD-10-CM | POA: Insufficient documentation

## 2022-08-08 DIAGNOSIS — M810 Age-related osteoporosis without current pathological fracture: Secondary | ICD-10-CM | POA: Insufficient documentation

## 2022-08-08 DIAGNOSIS — Z1211 Encounter for screening for malignant neoplasm of colon: Secondary | ICD-10-CM

## 2022-08-08 DIAGNOSIS — K449 Diaphragmatic hernia without obstruction or gangrene: Secondary | ICD-10-CM | POA: Diagnosis not present

## 2022-08-08 DIAGNOSIS — Z139 Encounter for screening, unspecified: Secondary | ICD-10-CM | POA: Diagnosis not present

## 2022-08-08 HISTORY — PX: BIOPSY: SHX5522

## 2022-08-08 HISTORY — PX: COLONOSCOPY WITH PROPOFOL: SHX5780

## 2022-08-08 SURGERY — COLONOSCOPY WITH PROPOFOL
Anesthesia: Monitor Anesthesia Care

## 2022-08-08 MED ORDER — PHENYLEPHRINE 80 MCG/ML (10ML) SYRINGE FOR IV PUSH (FOR BLOOD PRESSURE SUPPORT)
PREFILLED_SYRINGE | INTRAVENOUS | Status: DC | PRN
  Administered 2022-08-08: 160 ug via INTRAVENOUS

## 2022-08-08 MED ORDER — LIDOCAINE 2% (20 MG/ML) 5 ML SYRINGE
INTRAMUSCULAR | Status: DC | PRN
  Administered 2022-08-08: 40 mg via INTRAVENOUS

## 2022-08-08 MED ORDER — PROPOFOL 500 MG/50ML IV EMUL
INTRAVENOUS | Status: DC | PRN
  Administered 2022-08-08: 125 ug/kg/min via INTRAVENOUS

## 2022-08-08 MED ORDER — SODIUM CHLORIDE 0.9 % IV SOLN: INTRAVENOUS | Status: DC

## 2022-08-08 MED ORDER — PROPOFOL 10 MG/ML IV BOLUS
INTRAVENOUS | Status: DC | PRN
  Administered 2022-08-08: 20 mg via INTRAVENOUS
  Administered 2022-08-08: 50 mg via INTRAVENOUS

## 2022-08-08 MED ORDER — LACTATED RINGERS IV SOLN: INTRAVENOUS | Status: DC | PRN

## 2022-08-08 SURGICAL SUPPLY — 22 items

## 2022-08-08 NOTE — Anesthesia Postprocedure Evaluation (Signed)
Anesthesia Post Note  Patient: Tina [name]  Procedure(s) Performed: COLONOSCOPY WITH PROPOFOL BIOPSY     Patient location during evaluation: PACU Anesthesia Type: MAC Level of consciousness: awake and alert and oriented Pain management: pain level controlled Vital Signs Assessment: post-procedure vital signs reviewed and stable Respiratory status: spontaneous breathing, nonlabored ventilation and respiratory function stable Cardiovascular status: stable and blood pressure returned to baseline Postop Assessment: no apparent nausea or vomiting Anesthetic complications: no   No notable events documented.  Last Vitals:  Vitals:   08/08/22 0850 08/08/22 0900  BP: (!) 140/73 (!) 142/62  Pulse: 82 79  Resp: (!) 21 (!) 23  Temp:    SpO2: 97% 97%    Last Pain:  Vitals:   08/08/22 0900  TempSrc:   PainSc: 0-No pain                 Jerauld Bostwick A.

## 2022-08-08 NOTE — Transfer of Care (Signed)
Immediate Anesthesia Transfer of Care Note  Patient: MYSTI HALEY  Procedure(s) Performed: COLONOSCOPY WITH PROPOFOL BIOPSY  Patient Location: PACU and Endoscopy Unit  Anesthesia Type:MAC  Level of Consciousness: awake  Airway & Oxygen Therapy: Patient Spontanous Breathing and Patient connected to face mask oxygen  Post-op Assessment: Report given to RN and Post -op Vital signs reviewed and stable  Post vital signs: Reviewed and stable  Last Vitals:  Vitals Value Taken Time  BP    Temp    Pulse 86 08/08/22 0842  Resp 30 08/08/22 0842  SpO2 99 % 08/08/22 0842  Vitals shown include unvalidated device data.  Last Pain:  Vitals:   08/08/22 0720  TempSrc: Temporal  PainSc: 0-No pain         Complications: No notable events documented.

## 2022-08-08 NOTE — Discharge Instructions (Addendum)
YOU HAD AN ENDOSCOPIC PROCEDURE TODAY: Refer to the procedure report and other information in the discharge instructions given to you for any specific questions about what was found during the examination. If this information does not answer your questions, please call [facility] office at [phone] to clarify.   YOU SHOULD EXPECT: Some feelings of bloating in the abdomen. Passage of more gas than usual. Walking can help get rid of the air that was put into your GI tract during the procedure and reduce the bloating. If you had a lower endoscopy (such as a colonoscopy or flexible sigmoidoscopy) you may notice spotting of blood in your stool or on the toilet paper. Some abdominal soreness may be present for a day or two, also.  DIET: Your first meal following the procedure should be a light meal and then it is ok to progress to your normal diet. A half-sandwich or bowl of soup is an example of a good first meal. Heavy or fried foods are harder to digest and may make you feel nauseous or bloated. Drink plenty of fluids but you should avoid alcoholic beverages for 24 hours. If you had a esophageal dilation, please see attached instructions for diet.    ACTIVITY: Your care partner should take you home directly after the procedure. You should plan to take it easy, moving slowly for the rest of the day. You can resume normal activity the day after the procedure however YOU SHOULD NOT DRIVE, use power tools, machinery or perform tasks that involve climbing or major physical exertion for 24 hours (because of the sedation medicines used during the test).   SYMPTOMS TO REPORT IMMEDIATELY: A gastroenterologist can be reached at any hour. Please call [phone]  for any of the following symptoms:  . Following lower endoscopy (colonoscopy, flexible sigmoidoscopy) Excessive amounts of blood in the stool  Significant tenderness, worsening of abdominal pains  Swelling of the abdomen that is new, acute  Fever of 100  or higher  . Following upper endoscopy (EGD, EUS, ERCP, esophageal dilation) Vomiting of blood or coffee ground material  New, significant abdominal pain  New, significant chest pain or pain under the shoulder blades  Painful or persistently difficult swallowing  New shortness of breath  Black, tarry-looking or red, bloody stools  FOLLOW UP:  If any biopsies were taken you will be contacted by phone or by letter within the next 1-3 weeks. Call [phone]  if you have not heard about the biopsies in 3 weeks.  Please also call with any specific questions about appointments or follow up tests. YOU HAD AN ENDOSCOPIC PROCEDURE TODAY: Refer to the procedure report and other information in the discharge instructions given to you for any specific questions about what was found during the examination. If this information does not answer your questions, please call [facility] office at [phone] to clarify.   YOU SHOULD EXPECT: Some feelings of bloating in the abdomen. Passage of more gas than usual. Walking can help get rid of the air that was put into your GI tract during the procedure and reduce the bloating. If you had a lower endoscopy (such as a colonoscopy or flexible sigmoidoscopy) you may notice spotting of blood in your stool or on the toilet paper. Some abdominal soreness may be present for a day or two, also.  DIET: Your first meal following the procedure should be a light meal and then it is ok to progress to your normal diet. A half-sandwich or bowl of soup   example of a good first meal. Heavy or fried foods are harder to digest and may make you feel nauseous or bloated. Drink plenty of fluids but you should avoid alcoholic beverages for 24 hours. If you had a esophageal dilation, please see attached instructions for diet.    ACTIVITY: Your care partner should take you home directly after the procedure. You should plan to take it easy, moving slowly for the rest of the day. You can resume  normal activity the day after the procedure however YOU SHOULD NOT DRIVE, use power tools, machinery or perform tasks that involve climbing or major physical exertion for 24 hours (because of the sedation medicines used during the test).   SYMPTOMS TO REPORT IMMEDIATELY: A gastroenterologist can be reached at any hour. Please call [phone]  for any of the following symptoms:  Following lower endoscopy (colonoscopy, flexible sigmoidoscopy) Excessive amounts of blood in the stool  Significant tenderness, worsening of abdominal pains  Swelling of the abdomen that is new, acute  Fever of 100 or higher  Following upper endoscopy (EGD, EUS, ERCP, esophageal dilation) Vomiting of blood or coffee ground material  New, significant abdominal pain  New, significant chest pain or pain under the shoulder blades  Painful or persistently difficult swallowing  New shortness of breath  Black, tarry-looking or red, bloody stools  FOLLOW UP:  If any biopsies were taken you will be contacted by phone or by letter within the next 1-3 weeks. Call [phone]  if you have not heard about the biopsies in 3 weeks.  Please also call with any specific questions about appointments or follow up tests.YOU HAD AN ENDOSCOPIC PROCEDURE TODAY: Refer to the procedure report and other information in the discharge instructions given to you for any specific questions about what was found during the examination. If this information does not answer your questions, please call [facility] office at [phone] to clarify.   YOU SHOULD EXPECT: Some feelings of bloating in the abdomen. Passage of more gas than usual. Walking can help get rid of the air that was put into your GI tract during the procedure and reduce the bloating. If you had a lower endoscopy (such as a colonoscopy or flexible sigmoidoscopy) you may notice spotting of blood in your stool or on the toilet paper. Some abdominal soreness may be present for a day or two,  also.  DIET: Your first meal following the procedure should be a light meal and then it is ok to progress to your normal diet. A half-sandwich or bowl of soup is an example of a good first meal. Heavy or fried foods are harder to digest and may make you feel nauseous or bloated. Drink plenty of fluids but you should avoid alcoholic beverages for 24 hours. If you had a esophageal dilation, please see attached instructions for diet.    ACTIVITY: Your care partner should take you home directly after the procedure. You should plan to take it easy, moving slowly for the rest of the day. You can resume normal activity the day after the procedure however YOU SHOULD NOT DRIVE, use power tools, machinery or perform tasks that involve climbing or major physical exertion for 24 hours (because of the sedation medicines used during the test).   SYMPTOMS TO REPORT IMMEDIATELY: A gastroenterologist can be reached at any hour. Please call [phone]  for any of the following symptoms:  Following lower endoscopy (colonoscopy, flexible sigmoidoscopy) Excessive amounts of blood in the stool  Significant tenderness, worsening  of abdominal pains  Swelling of the abdomen that is new, acute  Fever of 100 or higher  Following upper endoscopy (EGD, EUS, ERCP, esophageal dilation) Vomiting of blood or coffee ground material  New, significant abdominal pain  New, significant chest pain or pain under the shoulder blades  Painful or persistently difficult swallowing  New shortness of breath  Black, tarry-looking or red, bloody stools  FOLLOW UP:  If any biopsies were taken you will be contacted by phone or by letter within the next 1-3 weeks. Call [phone]  if you have not heard about the biopsies in 3 weeks.  Please also call with any specific questions about appointments or follow up tests. YOU HAD AN ENDOSCOPIC PROCEDURE TODAY: Refer to the procedure report and other information in the discharge instructions  given to you for any specific questions about what was found during the examination. If this information does not answer your questions, please call [facility] office at [phone] to clarify.   YOU SHOULD EXPECT: Some feelings of bloating in the abdomen. Passage of more gas than usual. Walking can help get rid of the air that was put into your GI tract during the procedure and reduce the bloating. If you had a lower endoscopy (such as a colonoscopy or flexible sigmoidoscopy) you may notice spotting of blood in your stool or on the toilet paper. Some abdominal soreness may be present for a day or two, also.  DIET: Your first meal following the procedure should be a light meal and then it is ok to progress to your normal diet. A half-sandwich or bowl of soup is an example of a good first meal. Heavy or fried foods are harder to digest and may make you feel nauseous or bloated. Drink plenty of fluids but you should avoid alcoholic beverages for 24 hours. If you had a esophageal dilation, please see attached instructions for diet.    ACTIVITY: Your care partner should take you home directly after the procedure. You should plan to take it easy, moving slowly for the rest of the day. You can resume normal activity the day after the procedure however YOU SHOULD NOT DRIVE, use power tools, machinery or perform tasks that involve climbing or major physical exertion for 24 hours (because of the sedation medicines used during the test).   SYMPTOMS TO REPORT IMMEDIATELY: A gastroenterologist can be reached at any hour. Please call [phone]  for any of the following symptoms:  Following lower endoscopy (colonoscopy, flexible sigmoidoscopy) Excessive amounts of blood in the stool  Significant tenderness, worsening of abdominal pains  Swelling of the abdomen that is new, acute  Fever of 100 or higher  Following upper endoscopy (EGD, EUS, ERCP, esophageal dilation) Vomiting of blood or coffee ground material   New, significant abdominal pain  New, significant chest pain or pain under the shoulder blades  Painful or persistently difficult swallowing  New shortness of breath  Black, tarry-looking or red, bloody stools  FOLLOW UP:  If any biopsies were taken you will be contacted by phone or by letter within the next 1-3 weeks. Call [phone]  if you have not heard about the biopsies in 3 weeks.  Please also call with any specific questions about appointments or follow up tests.  RESTART ELIQUIS in 2 days (on 08/10/22).

## 2022-08-08 NOTE — Interval H&P Note (Signed)
History and Physical Interval Note:  08/08/2022 8:09 AM  Tina [name]  has presented today for surgery, with the diagnosis of Screening.  The various methods of treatment have been discussed with the patient and family. After consideration of risks, benefits and other options for treatment, the patient has consented to  Procedure(s): COLONOSCOPY WITH PROPOFOL (N/A) as a surgical intervention.  The patient's history has been reviewed, patient examined, no change in status, stable for surgery.  I have reviewed the patient's chart and labs.  Questions were answered to the patient's satisfaction.     Lear Ng

## 2022-08-08 NOTE — Anesthesia Preprocedure Evaluation (Signed)
Anesthesia Evaluation  Patient identified by MRN, date of birth, ID band Patient awake    Reviewed: Allergy & Precautions, NPO status , Patient's Chart, lab work & pertinent test results, reviewed documented beta blocker date and time   History of Anesthesia Complications (+) PONV, PROLONGED EMERGENCE and history of anesthetic complications  Airway Mallampati: II  TM Distance: >3 FB Neck ROM: Full    Dental no notable dental hx. (+) Teeth Intact, Caps, Dental Advisory Given   Pulmonary COPD   Pulmonary exam normal breath sounds clear to auscultation       Cardiovascular Normal cardiovascular exam+ dysrhythmias Atrial Fibrillation + Valvular Problems/Murmurs MVP  Rhythm:Regular Rate:Normal  Hx/o Atrial fibrillation   Neuro/Psych  PSYCHIATRIC DISORDERS Anxiety Depression    negative neurological ROS     GI/Hepatic hiatal hernia,GERD  Medicated and Controlled,,(+) Hepatitis -, A  Endo/Other  diabetes, Well Controlled  Osteoporosis HLD  Renal/GU negative Renal ROS Bladder dysfunction      Musculoskeletal  (+) Arthritis , Osteoarthritis,    Abdominal   Peds  Hematology Eliquis therapy- last dose 1/20   Anesthesia Other Findings   Reproductive/Obstetrics                              Anesthesia Physical Anesthesia Plan  ASA: 2  Anesthesia Plan: MAC   Post-op Pain Management: Minimal or no pain anticipated   Induction: Intravenous  PONV Risk Score and Plan: 2 and Propofol infusion and Treatment may vary due to age or medical condition  Airway Management Planned: Natural Airway and Nasal Cannula  Additional Equipment: None  Intra-op Plan:   Post-operative Plan:   Informed Consent: I have reviewed the patients History and Physical, chart, labs and discussed the procedure including the risks, benefits and alternatives for the proposed anesthesia with the patient or authorized  representative who has indicated his/her understanding and acceptance.     Dental advisory given  Plan Discussed with: CRNA and Anesthesiologist  Anesthesia Plan Comments:          Anesthesia Quick Evaluation

## 2022-08-08 NOTE — Op Note (Signed)
[facility] Patient Name: Tina [name] Procedure Date: 08/08/2022 MRN: [id number] Attending MD: Lear Ng , MD, [id number] Date of Birth: May 23, 1951 CSN: [id number] Age: 72 Admit Type: Outpatient Procedure:                Colonoscopy Indications:              Screening for colorectal malignant neoplasm, Last                            colonoscopy: April 2013 Providers:                Lear Ng, MD, Jamison Neighbor RN, RN,                            Hinton Dyer Technician, Technician Referring MD:             Richardo Priest Medicines:                Propofol per Anesthesia, Monitored Anesthesia Care Complications:            No immediate complications. Estimated Blood Loss:     Estimated blood loss was minimal. Procedure:                Pre-Anesthesia Assessment:                           - Prior to the procedure, a History and Physical                            was performed, and patient medications and                            allergies were reviewed. The patient's tolerance of                            previous anesthesia was also reviewed. The risks                            and benefits of the procedure and the sedation                            options and risks were discussed with the patient.                            All questions were answered, and informed consent                            was obtained. Prior Anticoagulants: The patient has                            taken Eliquis (apixaban), last dose was 2 days                            prior to procedure. ASA Grade Assessment: II - A  patient with mild systemic disease. After reviewing                            the risks and benefits, the patient was deemed in                            satisfactory condition to undergo the procedure.                           After obtaining informed consent, the colonoscope                            was passed under  direct vision. Throughout the                            procedure, the patient's blood pressure, pulse, and                            oxygen saturations were monitored continuously. The                            [id number]-HQ190L ([id number]) Olympus colonoscope was                            introduced through the anus and advanced to the the                            cecum, identified by appendiceal orifice and                            ileocecal valve. The colonoscopy was performed                            without difficulty. The patient tolerated the                            procedure well. The quality of the bowel                            preparation was fair and fair but repeated                            irrigation led to a good and adequate prep. The                            ileocecal valve, appendiceal orifice, and rectum                            were photographed. Scope In: 8:26:19 AM Scope Out: 8:36:25 AM Scope Withdrawal Time: 0 hours 7 minutes 18 seconds  Total Procedure Duration: 0 hours 10 minutes 6 seconds  Findings:      The perianal and digital rectal examinations were normal.      Multiple medium-mouthed and small-mouthed diverticula were found in the  entire colon.      A 2 mm polyp was found in the sigmoid colon. The polyp was semi-sessile.       The polyp was removed with a cold biopsy forceps. Resection and       retrieval were complete. Estimated blood loss was minimal.      Internal hemorrhoids were found during retroflexion. The hemorrhoids       were medium-sized and Grade I (internal hemorrhoids that do not       prolapse). Impression:               - Preparation of the colon was fair.                           - Diverticulosis in the entire examined colon.                           - One 2 mm polyp in the sigmoid colon, removed with                            a cold biopsy forceps. Resected and retrieved.                           - Internal  hemorrhoids. Moderate Sedation:      N/A - MAC procedure Recommendation:           - Await pathology results.                           - Repeat colonoscopy for surveillance based on                            pathology results.                           - High fiber diet.                           - Resume Eliquis (apixaban) at prior dose in 2 days. Procedure Code(s):        --- Professional ---                           [phone], Colonoscopy, flexible; with biopsy, single                            or multiple Diagnosis Code(s):        --- Professional ---                           Z12.11, Encounter for screening for malignant                            neoplasm of colon                           D12.5, Benign neoplasm of sigmoid colon  K64.0, First degree hemorrhoids                           K57.30, Diverticulosis of large intestine without                            perforation or abscess without bleeding CPT copyright 2022 [employer]. All rights reserved. The codes documented in this report are preliminary and upon coder review may  be revised to meet current compliance requirements. Lear Ng, MD 08/08/2022 8:47:51 AM This report has been signed electronically. Number of Addenda: 0

## 2022-08-08 NOTE — H&P (Signed)
Date of Initial H&P: 07/28/22  History reviewed, patient examined, no change in status, stable for surgery.

## 2022-08-08 NOTE — Anesthesia Procedure Notes (Signed)
Procedure Name: MAC Date/Time: 08/08/2022 8:15 AM  Performed by: Cynda Familia, CRNAPre-anesthesia Checklist: Patient identified, Emergency Drugs available, Suction available, Patient being monitored and Timeout performed Patient Re-evaluated:Patient Re-evaluated prior to induction Oxygen Delivery Method: Simple face mask Placement Confirmation: positive ETCO2 and breath sounds checked- equal and bilateral Dental Injury: Teeth and Oropharynx as per pre-operative assessment

## 2022-08-09 ENCOUNTER — Encounter (HOSPITAL_COMMUNITY): Payer: Self-pay | Admitting: Gastroenterology

## 2022-08-09 LAB — SURGICAL PATHOLOGY

## 2022-08-10 ENCOUNTER — Other Ambulatory Visit: Payer: Self-pay | Admitting: Cardiology

## 2022-08-10 DIAGNOSIS — Z1231 Encounter for screening mammogram for malignant neoplasm of breast: Secondary | ICD-10-CM | POA: Diagnosis not present

## 2022-08-17 DIAGNOSIS — Z0000 Encounter for general adult medical examination without abnormal findings: Secondary | ICD-10-CM | POA: Diagnosis not present

## 2022-08-17 DIAGNOSIS — I4891 Unspecified atrial fibrillation: Secondary | ICD-10-CM | POA: Diagnosis not present

## 2022-08-17 DIAGNOSIS — E785 Hyperlipidemia, unspecified: Secondary | ICD-10-CM | POA: Diagnosis not present

## 2022-08-17 DIAGNOSIS — J45991 Cough variant asthma: Secondary | ICD-10-CM | POA: Diagnosis not present

## 2022-08-17 DIAGNOSIS — Z6824 Body mass index (BMI) 24.0-24.9, adult: Secondary | ICD-10-CM | POA: Diagnosis not present

## 2022-08-17 DIAGNOSIS — E7439 Other disorders of intestinal carbohydrate absorption: Secondary | ICD-10-CM | POA: Diagnosis not present

## 2022-08-17 DIAGNOSIS — Z79899 Other long term (current) drug therapy: Secondary | ICD-10-CM | POA: Diagnosis not present

## 2022-08-17 DIAGNOSIS — Z1331 Encounter for screening for depression: Secondary | ICD-10-CM | POA: Diagnosis not present

## 2022-08-18 NOTE — Progress Notes (Unsigned)
[facility] Summary Report / Loop Recorder 

## 2022-08-20 LAB — CUP PACEART REMOTE DEVICE CHECK
Date Time Interrogation Session: 20240203230410
Implantable Pulse Generator Implant Date: 20210412

## 2022-08-21 ENCOUNTER — Ambulatory Visit: Payer: Medicare PPO

## 2022-08-21 DIAGNOSIS — R55 Syncope and collapse: Secondary | ICD-10-CM

## 2022-08-22 DIAGNOSIS — H25813 Combined forms of age-related cataract, bilateral: Secondary | ICD-10-CM | POA: Diagnosis not present

## 2022-08-22 DIAGNOSIS — H40013 Open angle with borderline findings, low risk, bilateral: Secondary | ICD-10-CM | POA: Diagnosis not present

## 2022-08-28 ENCOUNTER — Other Ambulatory Visit: Payer: Self-pay | Admitting: Cardiology

## 2022-08-28 DIAGNOSIS — I480 Paroxysmal atrial fibrillation: Secondary | ICD-10-CM

## 2022-08-28 NOTE — Telephone Encounter (Signed)
Pt last saw Dr Bettina Gavia 05/05/22, last labs 05/05/22 Creat 0.88, age 71, weight 62.1kg, based on specified criteria pt is on appropriate dosage of Eliquis 80m BID for afib.  Will refill rx.

## 2022-09-25 ENCOUNTER — Ambulatory Visit (INDEPENDENT_AMBULATORY_CARE_PROVIDER_SITE_OTHER): Payer: Medicare PPO

## 2022-09-25 DIAGNOSIS — R55 Syncope and collapse: Secondary | ICD-10-CM

## 2022-09-27 LAB — CUP PACEART REMOTE DEVICE CHECK
Date Time Interrogation Session: 20240310230142
Implantable Pulse Generator Implant Date: 20210412

## 2022-10-05 NOTE — Progress Notes (Signed)
[facility] Summary Report / Loop Recorder 

## 2022-10-26 DIAGNOSIS — L814 Other melanin hyperpigmentation: Secondary | ICD-10-CM | POA: Diagnosis not present

## 2022-10-26 DIAGNOSIS — D225 Melanocytic nevi of trunk: Secondary | ICD-10-CM | POA: Diagnosis not present

## 2022-10-26 DIAGNOSIS — L578 Other skin changes due to chronic exposure to nonionizing radiation: Secondary | ICD-10-CM | POA: Diagnosis not present

## 2022-10-26 DIAGNOSIS — L821 Other seborrheic keratosis: Secondary | ICD-10-CM | POA: Diagnosis not present

## 2022-10-26 DIAGNOSIS — D485 Neoplasm of uncertain behavior of skin: Secondary | ICD-10-CM | POA: Diagnosis not present

## 2022-10-30 ENCOUNTER — Ambulatory Visit (INDEPENDENT_AMBULATORY_CARE_PROVIDER_SITE_OTHER): Payer: Medicare PPO

## 2022-10-30 DIAGNOSIS — R55 Syncope and collapse: Secondary | ICD-10-CM

## 2022-10-31 LAB — CUP PACEART REMOTE DEVICE CHECK
Date Time Interrogation Session: 20240414230310
Implantable Pulse Generator Implant Date: 20210412

## 2022-11-02 DIAGNOSIS — M810 Age-related osteoporosis without current pathological fracture: Secondary | ICD-10-CM | POA: Diagnosis not present

## 2022-11-02 NOTE — Progress Notes (Signed)
[facility] Summary Report / Loop Recorder 

## 2022-11-06 DIAGNOSIS — H903 Sensorineural hearing loss, bilateral: Secondary | ICD-10-CM | POA: Diagnosis not present

## 2022-11-06 DIAGNOSIS — H8120 Vestibular neuronitis, unspecified ear: Secondary | ICD-10-CM | POA: Insufficient documentation

## 2022-11-06 DIAGNOSIS — H9313 Tinnitus, bilateral: Secondary | ICD-10-CM | POA: Diagnosis not present

## 2022-11-06 HISTORY — DX: Vestibular neuronitis, unspecified ear: H81.20

## 2022-11-06 NOTE — Progress Notes (Unsigned)
Cardiology Office Note:    Date:  11/07/2022   ID:  Tina [name], DOB 1950-10-05, MRN [id number]  PCP:  Buckner Malta, MD  Cardiologist:  Norman Herrlich, MD    Referring MD: Buckner Malta, MD    ASSESSMENT:    1. PAF (paroxysmal atrial fibrillation)   2. SVT (supraventricular tachycardia)   3. High risk medication use   4. Chronic anticoagulation   5. Syncope, unspecified syncope type    PLAN:    In order of problems listed above:  Stable maintaining sinus rhythm continue her current anticoagulant low-dose flecainide and her beta-blocker. No recurrent syncope we continue to follow in device clinic no new atrial fibrillation or bradycardia noted   Next appointment: 9 months   Medication Adjustments/Labs and Tests Ordered: Current medicines are reviewed at length with the patient today.  Concerns regarding medicines are outlined above.  No orders of the defined types were placed in this encounter.  No orders of the defined types were placed in this encounter.   Chief Complaint  Patient presents with   Follow-up   Atrial Fibrillation    History of Present Illness:    Tina [name] is a 72 y.o. female with a hx of PAF and SVT suppressed with flecainide and chronic anticoagulation with implanted loop recorder for syncope last seen 05/05/2022.  She follows in our device clinic with no documented episodes of symptomatic bradycardia or recurrent SVT or atrial fibrillation.  Compliance with diet, lifestyle and medications: Yes  She has had no recurrence of her atrial arrhythmia clinically tolerates flecainide and her anticoagulant EKG today shows sinus rhythm right branch block with no finding of 1C antiarrhythmic drug toxicity Recent labs 05/05/2022 creatinine 0.8 potassium 3.9     Past Medical History:  Diagnosis Date   Abnormal mammogram of right breast 10/2016   Allergy to pollen 09/26/2018   Anxiety and depression 02/01/2011   Aortic atherosclerosis     Arthritis    right Thumb   Atrophic vaginitis 04/28/2020   Chest pain in adult    Normal MPS 2016   Chronic anticoagulation 09/09/2018   Complication of anesthesia    prolonged sedation   Dental crowns present    Dupuytren contracture    Bilateral hands, mild   GERD (gastroesophageal reflux disease)    Gestational diabetes    history of   Grade I diastolic dysfunction 05/09/2017   on ECHO   Heart palpitations 10/13/2010   Overview:  Last Assessment & Plan:  Long hx of same - controlled with beta-blocker  ?orthostatic symptoms in AM with low normal BP - pt will monitor to see if symptoms correlate with BP reading No changes recommended today - reassured re: cards recommendation to take extra 1/2 tab prn symptoms    Hepatitis    age 75 "infectious"   High risk medication use 09/09/2018   History of blood transfusion 1982   History of cardiomegaly 04/14/2017   Noted on CXR   History of hepatitis as a child    "infectious", per pt.   History of hiatal hernia    History of pleurisy    History of pneumonia    Hypercholesterolemia    Overview:  Last Assessment & Plan:  On statin Well controlled last check, monitor annually The current medical regimen is effective;  continue present plan and medications.    Hyperlipidemia    Irregular heartbeat    since age 29, per pt.   Itchy eyes 09/15/2019  Leg pain, right 03/20/2012   Limited joint range of motion    cervical spine - difficulty looking up for extended period   MR (mitral regurgitation) 05/09/2017   Mild, noted on ECHO   MVP (mitral valve prolapse)    Mild   Myalgia due to statin 03/07/2018   Orthostatic hypotension    Osteoporosis    Osteoporosis, post-menopausal    hx RLE fib fx 03/2012 following fall DEXA 03/13/13 @ [facility]: -2.5 at spine    PAF (paroxysmal atrial fibrillation) 09/04/2018   Pleuritic chest pain 08/24/2014   Pleuritis 04/17/2017   PONV (postoperative nausea and vomiting)    Postural dizziness 02/01/2011   Pulmonary  nodule 04/18/2017   5 mm left lower lobe nodule    Right thyroid nodule 09/26/2018   Seasonal allergies    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   SUI (stress urinary incontinence, female)    SVT (supraventricular tachycardia)    Syncope 08/21/2018   Tinnitus of both ears 04/26/2020   TR (tricuspid regurgitation) 05/09/2017   Mild, noted on ECHO   Urinary tract infectious disease 04/28/2020   Wears contact lenses    Wears glasses     Past Surgical History:  Procedure Laterality Date   ANTERIOR CERVICAL DECOMP/DISCECTOMY FUSION  02/03/2002   C5-6   BIOPSY  08/08/2022   Procedure: BIOPSY;  Surgeon: Charlott Rakes, MD;  Location: [facility];  Service: Gastroenterology;;   BLADDER SUSPENSION N/A 04/26/2018   Procedure: TRANSVAGINAL TAPE (TVT) PROCEDURE;  Surgeon: Carrington Clamp, MD;  Location: [facility];  Service: Gynecology;  Laterality: N/A;   CESAREAN SECTION     x 3   COLONOSCOPY  2013   several   COLONOSCOPY WITH PROPOFOL N/A 08/08/2022   Procedure: COLONOSCOPY WITH PROPOFOL;  Surgeon: Charlott Rakes, MD;  Location: [facility];  Service: Gastroenterology;  Laterality: N/A;   CYSTOCELE REPAIR N/A 04/26/2018   Procedure: ANTERIOR REPAIR (CYSTOCELE);  Surgeon: Carrington Clamp, MD;  Location: [facility];  Service: Gynecology;  Laterality: N/A;   CYSTOSCOPY N/A 04/26/2018   Procedure: CYSTOSCOPY;  Surgeon: Carrington Clamp, MD;  Location: [facility];  Service: Gynecology;  Laterality: N/A;   INGUINAL HERNIA REPAIR Right    LAPAROSCOPIC NISSEN FUNDOPLICATION  07/07/2003   NISSEN FUNDOPLICATION  08/14/2007   redo   RADIOACTIVE SEED GUIDED EXCISIONAL BREAST BIOPSY Right 11/08/2016   Procedure: RADIOACTIVE SEED GUIDED EXCISIONAL RIGHT BREAST BIOPSY;  Surgeon: Emelia Loron, MD;  Location: [facility];  Service: General;  Laterality: Right;   TONSILLECTOMY  1977   TUBAL LIGATION     UPPER  GI ENDOSCOPY     x2   WISDOM TOOTH EXTRACTION      Current Medications: No outpatient medications have been marked as taking for the 11/07/22 encounter (Office Visit) with Baldo Daub, MD.     Allergies:   Codeine, E.e.s. [erythromycin], Zocor [simvastatin], Other, and Penicillins   Social History   Socioeconomic History   Marital status: Married    Spouse name: Not on file   Number of children: Not on file   Years of education: Not on file   Highest education level: Not on file  Occupational History   Not on file  Tobacco Use   Smoking status: Never   Smokeless tobacco: Never  Vaping Use   Vaping Use: Never used  Substance and Sexual Activity   Alcohol use: No   Drug use: No   Sexual activity: Not  on file  Other Topics Concern   Not on file  Social History Narrative   Retired high school business teacher   Master's degree educatation   Married, lives with spouse -   Social Determinants of Corporate investment banker Strain: Not on file  Food Insecurity: Not on file  Transportation Needs: Not on file  Physical Activity: Not on file  Stress: Not on file  Social Connections: Not on file     Family History: The patient's family history includes Alzheimer's disease in her father and mother; Congestive Heart Failure in her father; Heart attack in her father, maternal grandfather, maternal grandmother, paternal grandfather, and paternal grandmother; Heart disease in her father; Heart failure in her father; Hyperlipidemia in her father; Prostate cancer in her father; Stroke in her paternal grandmother. ROS:   Please see the history of present illness.    All other systems reviewed and are negative.  EKGs/Labs/Other Studies Reviewed:    The following studies were reviewed today:  Cardiac Studies & Procedures     STRESS TESTS  NM MYOCAR MULTI W/SPECT W 12/08/2011   ECHOCARDIOGRAM  ECHOCARDIOGRAM COMPLETE 05/09/2017  Narrative *[facility]* [address] [place], [place] [address] [phone]  ------------------------------------------------------------------- Transthoracic Echocardiography  Patient:    Evaleen, Sant MR #:       [id number] Study Date: 05/09/2017 Gender:     F Age:        73 Height:     165.1 cm Weight:     64.3 kg BSA:        1.72 m^2 Pt. Status: Room:  ATTENDING    Norman Herrlich, MD ORDERING     Norman Herrlich, MD REFERRING    Norman Herrlich, MD SONOGRAPHER  Nolon Rod, RDCS PERFORMING   [facility] [place] [facility]  cc:  ------------------------------------------------------------------- LV EF: 50% -   55%  ------------------------------------------------------------------- Indications:      Chest pain 786.51.  Supraventricular tachycardia 427.0.  ------------------------------------------------------------------- Study Conclusions  - Left ventricle: The cavity size was normal. Systolic function was normal. The estimated ejection fraction was in the range of 50% to 55%. Wall motion was normal; there were no regional wall motion abnormalities. Doppler parameters are consistent with abnormal left ventricular relaxation (grade 1 diastolic dysfunction). - Mitral valve: Mild prolapse of the anterior leaflet. There was mild regurgitation.  Impressions:  - Low normal EF. Mild prolaps of the anterior leaflet MV. Mild MR, TR.  ------------------------------------------------------------------- Study data:   Study status:  Routine.  Study completion:  There were no complications.          Transthoracic echocardiography. M-mode, complete 2D, spectral Doppler, and color Doppler. Birthdate:  Patient birthdate: [birth year].  Age:  Patient is 72 yr old.  Sex:  Gender: female.    BMI: 23.6 kg/m^2.  Blood pressure: 120/60  Patient status:  Outpatient.  Study date:  Study date: 05/09/2017. Study time: 09:09 AM.  Location:  Echo  laboratory.  -------------------------------------------------------------------  ------------------------------------------------------------------- Left ventricle:  The cavity size was normal. Systolic function was normal. The estimated ejection fraction was in the range of 50% to 55%. Wall motion was normal; there were no regional wall motion abnormalities. Doppler parameters are consistent with abnormal left ventricular relaxation (grade 1 diastolic dysfunction).  ------------------------------------------------------------------- Aortic valve:   Trileaflet; normal thickness leaflets. Mobility was not restricted.  Doppler:  Transvalvular velocity was within the normal range. There was no stenosis. There was no regurgitation.  ------------------------------------------------------------------- Aorta:  Aortic root:  The aortic root was normal in size.  ------------------------------------------------------------------- Mitral valve:  Mobility was not restricted.  Prolapse.  Doppler: Transvalvular velocity was within the normal range. There was no evidence for stenosis. There was mild regurgitation.  ------------------------------------------------------------------- Left atrium:  The atrium was normal in size.  ------------------------------------------------------------------- Right ventricle:  The cavity size was normal. Wall thickness was normal. Systolic function was normal.  ------------------------------------------------------------------- Pulmonic valve:    Structurally normal valve.   Cusp separation was normal.  Doppler:  Transvalvular velocity was within the normal range. There was no evidence for stenosis. There was no regurgitation.  ------------------------------------------------------------------- Tricuspid valve:   Structurally normal valve.    Doppler: Transvalvular velocity was within the normal range. There was  mild regurgitation.  ------------------------------------------------------------------- Pulmonary artery:   The main pulmonary artery was normal-sized. Systolic pressure was within the normal range.  ------------------------------------------------------------------- Right atrium:  The atrium was normal in size.  ------------------------------------------------------------------- Pericardium:  There was no pericardial effusion.  ------------------------------------------------------------------- Systemic veins: Inferior vena cava: The vessel was normal in size.  ------------------------------------------------------------------- Measurements  Left ventricle                          Value        Reference LV ID, ED, PLAX chordal                 49.4  mm     43 - 52 LV ID, ES, PLAX chordal          (H)    38.2  mm     23 - 38 LV fx shortening, PLAX chordal   (L)    23    %      >=29 LV PW thickness, ED                     8.79  mm     ---------- IVS/LV PW ratio, ED                     0.86         <=1.3 Stroke volume, 2D                       46    ml     ---------- Stroke volume/bsa, 2D                   27    ml/m^2 ---------- LV ejection fraction, 1-p A4C           38    %      ---------- LV end-diastolic volume, 2-p            72    ml     ---------- LV end-systolic volume, 2-p             44    ml     ---------- LV ejection fraction, 2-p               39    %      ---------- Stroke volume, 2-p                      28    ml     ---------- LV end-diastolic volume/bsa, 2-p        42    ml/m^2 ---------- LV end-systolic volume/bsa, 2-p         25  ml/m^2 ---------- Stroke volume/bsa, 2-p                  16.2  ml/m^2 ---------- LV e&', lateral                          8.38  cm/s   ---------- LV E/e&', lateral                        5.81         ---------- LV e&', medial                           7.18  cm/s   ---------- LV E/e&', medial                         6.78          ---------- LV e&', average                          7.78  cm/s   ---------- LV E/e&', average                        6.26         ---------- Longitudinal strain, TDI                20    %      ----------  Ventricular septum                      Value        Reference IVS thickness, ED                       7.59  mm     ----------  LVOT                                    Value        Reference LVOT ID, S                              20    mm     ---------- LVOT area                               3.14  cm^2   ---------- LVOT peak velocity, S                   63.1  cm/s   ---------- LVOT mean velocity, S                   44.5  cm/s   ---------- LVOT VTI, S                             14.5  cm     ----------  Aorta                                   Value        Reference  Aortic root ID, ED                      33    mm     ----------  Left atrium                             Value        Reference LA ID, A-P, ES                          36    mm     ---------- LA ID/bsa, A-P                          2.09  cm/m^2 <=2.2 LA volume, S                            71.4  ml     ---------- LA volume/bsa, S                        41.4  ml/m^2 ---------- LA volume, ES, 1-p A4C                  70.8  ml     ---------- LA volume/bsa, ES, 1-p A4C              41.1  ml/m^2 ---------- LA volume, ES, 1-p A2C                  64.9  ml     ---------- LA volume/bsa, ES, 1-p A2C              37.6  ml/m^2 ----------  Mitral valve                            Value        Reference Mitral E-wave peak velocity             48.7  cm/s   ---------- Mitral A-wave peak velocity             64.5  cm/s   ---------- Mitral deceleration time         (H)    239   ms     150 - 230 Mitral E/A ratio, peak                  0.8          ----------  Pulmonary arteries                      Value        Reference PA pressure, S, DP                      27    mm Hg  <=30  Tricuspid valve                         Value         Reference Tricuspid regurg peak velocity          245   cm/s   ---------- Tricuspid peak RV-RA gradient           24  mm Hg  ----------  Right atrium                            Value        Reference RA ID, S-I, ES, A4C              (H)    49.6  mm     34 - 49 RA area, ES, A4C                        16.2  cm^2   8.3 - 19.5 RA volume, ES, A/L                      43.7  ml     ---------- RA volume/bsa, ES, A/L                  25.3  ml/m^2 ----------  Systemic veins                          Value        Reference Estimated CVP                           3     mm Hg  ----------  Right ventricle                         Value        Reference TAPSE                                   21.2  mm     ---------- RV pressure, S, DP                      27    mm Hg  <=30 RV s&', lateral, S                       11.6  cm/s   ----------  Legend: (L)  and  (H)  mark values outside specified reference range.  ------------------------------------------------------------------- Prepared and Electronically Authenticated by  Gypsy Balsam, MD 2018-10-24T13:53:12    MONITORS  LONG TERM MONITOR (3-14 DAYS) 09/03/2018  Narrative A 3-day ZIO monitor was performed to evaluate syncope and SVT initiating 08/23/2018.  The predominant rhythm is sinus with minimum average and maximum heart rates of 59, 76 and 127 bpm.  Atrial fibrillation occurred 4% burden average rate 89 bpm minimum maximum 65 and 149 bpm.  The atrial fibrillation was symptomatic with palpitation.  There were no pauses of 3 seconds or greater and there was no AV block or sinus node exit block.  Rare ventricular ectopy was seen less than 1% there was a strip present as ventricular tachycardia which was a brief run of atrial premature beats.  Rare supraventricular ectopy is seen along with brief runs of APCs and a 4% burden of atrial fibrillation or atypical atrial flutter.  Triggered events were present predominantly with  supraventricular and ventricular ectopy.    Conclusion paroxysmal atrial fibrillation 4% burden           EKG:  EKG ordered today and personally reviewed.  The ekg ordered today demonstrates sinus rhythm right bundle  branch block QRS duration 140 ms  Recent Labs: 05/05/2022: ALT 38; BUN 27; Creatinine, Ser 0.88; Hemoglobin 16.2; Platelets 346; Potassium 3.9; Sodium 140  Recent Lipid Panel    Component Value Date/Time   CHOL 164 05/04/2021 1325   TRIG 190 (H) 05/04/2021 1325   HDL 44 05/04/2021 1325   CHOLHDL 3.7 05/04/2021 1325   CHOLHDL 5 03/04/2015 0824   VLDL 20.8 03/04/2015 0824   LDLCALC 88 05/04/2021 1325    Physical Exam:    VS:  BP 100/64 (BP Location: Right Arm, Patient Position: Sitting, Cuff Size: Normal)   Pulse 69   Ht 5\' 4"  (1.626 m)   Wt 143 lb 3.2 oz (65 kg)   SpO2 97%   BMI 24.58 kg/m     Wt Readings from Last 3 Encounters:  11/07/22 143 lb 3.2 oz (65 kg)  08/08/22 137 lb (62.1 kg)  05/05/22 139 lb 3.2 oz (63.1 kg)     GEN:  Well nourished, well developed in no acute distress HEENT: Normal NECK: No JVD; No carotid bruits LYMPHATICS: No lymphadenopathy CARDIAC: RRR, no murmurs, rubs, gallops RESPIRATORY:  Clear to auscultation without rales, wheezing or rhonchi  ABDOMEN: Soft, non-tender, non-distended MUSCULOSKELETAL:  No edema; No deformity  SKIN: Warm and dry NEUROLOGIC:  Alert and oriented x 3 PSYCHIATRIC:  Normal affect    Signed, Norman Herrlich, MD  11/07/2022 4:09 PM    [facility]

## 2022-11-07 ENCOUNTER — Ambulatory Visit: Payer: Medicare PPO | Attending: Cardiology | Admitting: Cardiology

## 2022-11-07 ENCOUNTER — Encounter: Payer: Self-pay | Admitting: Cardiology

## 2022-11-07 VITALS — BP 100/64 | HR 69 | Ht 64.0 in | Wt 143.2 lb

## 2022-11-07 DIAGNOSIS — I471 Supraventricular tachycardia, unspecified: Secondary | ICD-10-CM

## 2022-11-07 DIAGNOSIS — R55 Syncope and collapse: Secondary | ICD-10-CM

## 2022-11-07 DIAGNOSIS — Z7901 Long term (current) use of anticoagulants: Secondary | ICD-10-CM | POA: Diagnosis not present

## 2022-11-07 DIAGNOSIS — I480 Paroxysmal atrial fibrillation: Secondary | ICD-10-CM

## 2022-11-07 DIAGNOSIS — Z79899 Other long term (current) drug therapy: Secondary | ICD-10-CM

## 2022-11-07 NOTE — Patient Instructions (Signed)
Medication Instructions:  Your physician recommends that you continue on your current medications as directed. Please refer to the Current Medication list given to you today.  *If you need a refill on your cardiac medications before your next appointment, please call your pharmacy*   Lab Work: None If you have labs (blood work) drawn today and your tests are completely normal, you will receive your results only by: MyChart Message (if you have MyChart) OR A paper copy in the mail If you have any lab test that is abnormal or we need to change your treatment, we will call you to review the results.   Testing/Procedures: None   Follow-Up: At [facility], you and your health needs are our priority.  As part of our continuing mission to provide you with exceptional heart care, we have created designated Provider Care Teams.  These Care Teams include your primary Cardiologist (physician) and Advanced Practice Providers (APPs -  Physician Assistants and Nurse Practitioners) who all work together to provide you with the care you need, when you need it.  We recommend signing up for the patient portal called "MyChart".  Sign up information is provided on this After Visit Summary.  MyChart is used to connect with patients for Virtual Visits (Telemedicine).  Patients are able to view lab/test results, encounter notes, upcoming appointments, etc.  Non-urgent messages can be sent to your provider as well.   To learn more about what you can do with MyChart, go to [email or web address].    Your next appointment:   9 month(s)  Provider:   Brian Munley, MD    Other Instructions None  

## 2022-11-08 ENCOUNTER — Ambulatory Visit: Payer: Medicare PPO | Admitting: Cardiology

## 2022-11-20 DIAGNOSIS — H25813 Combined forms of age-related cataract, bilateral: Secondary | ICD-10-CM | POA: Diagnosis not present

## 2022-12-04 ENCOUNTER — Ambulatory Visit (INDEPENDENT_AMBULATORY_CARE_PROVIDER_SITE_OTHER): Payer: Medicare PPO

## 2022-12-04 DIAGNOSIS — R55 Syncope and collapse: Secondary | ICD-10-CM | POA: Diagnosis not present

## 2022-12-04 LAB — CUP PACEART REMOTE DEVICE CHECK
Date Time Interrogation Session: 20240517230437
Implantable Pulse Generator Implant Date: 20210412

## 2022-12-04 NOTE — Progress Notes (Signed)
[facility] Summary Report / Loop Recorder 

## 2022-12-19 DIAGNOSIS — H16213 Exposure keratoconjunctivitis, bilateral: Secondary | ICD-10-CM | POA: Diagnosis not present

## 2022-12-19 DIAGNOSIS — H401131 Primary open-angle glaucoma, bilateral, mild stage: Secondary | ICD-10-CM | POA: Diagnosis not present

## 2022-12-29 ENCOUNTER — Telehealth: Payer: Self-pay

## 2022-12-29 NOTE — Telephone Encounter (Signed)
Please call for specific procedure details.  We do not typically recommend holding Eliquis for cataract surgery alone, however at times patient is having additional treatment and the MD wants blood thinner held.

## 2022-12-29 NOTE — Telephone Encounter (Signed)
Patient with diagnosis of afib on Eliquis for anticoagulation.    Procedure: hydrus goniotomy Date of procedure: 01/22/23  CHA2DS2-VASc Score = 3  This indicates a 3.2% annual risk of stroke. The patient's score is based upon: CHF History: 0 HTN History: 0 Diabetes History: 0 Stroke History: 0 Vascular Disease History: 1 Age Score: 1 Gender Score: 1   CrCl 87mL/min Platelet count 346K  Per office protocol, patient can hold Eliquis for 1-2 days prior to procedure.    **This guidance is not considered finalized until pre-operative APP has relayed final recommendations.**

## 2022-12-29 NOTE — Telephone Encounter (Signed)
S/w Katie @ [facility]. Pt is getting Hydrus Goniotomy. Small stent in right eye to reduce pressure. The procedure is more vascular and has a higher risk of a bleed. Will send to pre op to re evaluate.

## 2022-12-29 NOTE — Telephone Encounter (Signed)
   Pre-operative Risk Assessment    Patient Name: Tina [name]  DOB: 1951-03-28 MRN: [id number]      Request for Surgical Clearance    Procedure:   Cataract and Glaucoma Surgery  Date of Surgery:  Clearance 01/22/23                                 Surgeon:  Dr. Rockney Ghee. [place] Sur[facility]'s Group or Practice Name:  [facility] the [facility] number:  [phone] Fax number:  [phone]     Type of Clearance Requested:   - Pharmacy:  Hold Apixaban (Eliquis) Eliquis   Type of Anesthesia:   IV Sedation   Additional requests/questions:  Please fax a copy of signed request form  to the surgeon's office.  Signed, Viviano Simas   12/29/2022, 1:46 PM

## 2023-01-01 NOTE — Progress Notes (Signed)
[facility] Summary Report / Loop Recorder 

## 2023-01-01 NOTE — Telephone Encounter (Signed)
   Patient Name: Tina [name]  DOB: 1950-08-19 MRN: [id number]  Primary Cardiologist: Norman Herrlich, MD  Clinical pharmacists have reviewed the patient's past medical history, labs, and current medications as part of preoperative protocol coverage. The following recommendations have been made:  Patient with diagnosis of afib on Eliquis for anticoagulation.     Procedure: hydrus goniotomy Date of procedure: 01/22/23   CHA2DS2-VASc Score = 3  This indicates a 3.2% annual risk of stroke. The patient's score is based upon: CHF History: 0 HTN History: 0 Diabetes History: 0 Stroke History: 0 Vascular Disease History: 1 Age Score: 1 Gender Score: 1   CrCl 70mL/min Platelet count 346K   Per office protocol, patient can hold Eliquis for 1-2 days prior to procedure.  Please resume Eliquis as soon as possible postprocedure, at the discretion of the surgeon.    I will route this recommendation to the requesting party via Epic fax function and remove from pre-op pool.  Please call with questions.  Joylene Grapes, NP 01/01/2023, 11:16 AM

## 2023-01-04 ENCOUNTER — Ambulatory Visit: Payer: Medicare PPO

## 2023-01-04 DIAGNOSIS — R55 Syncope and collapse: Secondary | ICD-10-CM | POA: Diagnosis not present

## 2023-01-04 LAB — CUP PACEART REMOTE DEVICE CHECK
Date Time Interrogation Session: 20240619230521
Implantable Pulse Generator Implant Date: 20210412

## 2023-01-10 DIAGNOSIS — M79672 Pain in left foot: Secondary | ICD-10-CM | POA: Diagnosis not present

## 2023-01-10 HISTORY — DX: Pain in left foot: M79.672

## 2023-01-11 DIAGNOSIS — M79672 Pain in left foot: Secondary | ICD-10-CM | POA: Diagnosis not present

## 2023-01-15 DIAGNOSIS — H409 Unspecified glaucoma: Secondary | ICD-10-CM | POA: Diagnosis not present

## 2023-01-15 DIAGNOSIS — H52202 Unspecified astigmatism, left eye: Secondary | ICD-10-CM | POA: Diagnosis not present

## 2023-01-15 DIAGNOSIS — H2512 Age-related nuclear cataract, left eye: Secondary | ICD-10-CM | POA: Diagnosis not present

## 2023-01-15 DIAGNOSIS — H401121 Primary open-angle glaucoma, left eye, mild stage: Secondary | ICD-10-CM | POA: Diagnosis not present

## 2023-01-15 DIAGNOSIS — H25812 Combined forms of age-related cataract, left eye: Secondary | ICD-10-CM | POA: Diagnosis not present

## 2023-01-15 DIAGNOSIS — H25813 Combined forms of age-related cataract, bilateral: Secondary | ICD-10-CM | POA: Diagnosis not present

## 2023-01-15 DIAGNOSIS — H269 Unspecified cataract: Secondary | ICD-10-CM | POA: Diagnosis not present

## 2023-01-22 DIAGNOSIS — H269 Unspecified cataract: Secondary | ICD-10-CM | POA: Diagnosis not present

## 2023-01-22 DIAGNOSIS — H52201 Unspecified astigmatism, right eye: Secondary | ICD-10-CM | POA: Diagnosis not present

## 2023-01-22 DIAGNOSIS — H25811 Combined forms of age-related cataract, right eye: Secondary | ICD-10-CM | POA: Diagnosis not present

## 2023-01-22 DIAGNOSIS — H2511 Age-related nuclear cataract, right eye: Secondary | ICD-10-CM | POA: Diagnosis not present

## 2023-01-22 DIAGNOSIS — H401111 Primary open-angle glaucoma, right eye, mild stage: Secondary | ICD-10-CM | POA: Diagnosis not present

## 2023-01-22 DIAGNOSIS — H409 Unspecified glaucoma: Secondary | ICD-10-CM | POA: Diagnosis not present

## 2023-01-24 NOTE — Progress Notes (Signed)
[facility] Summary Report / Loop Recorder 

## 2023-02-05 ENCOUNTER — Ambulatory Visit (INDEPENDENT_AMBULATORY_CARE_PROVIDER_SITE_OTHER): Payer: Medicare PPO

## 2023-02-05 DIAGNOSIS — R55 Syncope and collapse: Secondary | ICD-10-CM | POA: Diagnosis not present

## 2023-02-06 LAB — CUP PACEART REMOTE DEVICE CHECK
Date Time Interrogation Session: 20240722230023
Implantable Pulse Generator Implant Date: 20210412

## 2023-02-13 ENCOUNTER — Other Ambulatory Visit: Payer: Self-pay | Admitting: Cardiology

## 2023-02-13 DIAGNOSIS — I480 Paroxysmal atrial fibrillation: Secondary | ICD-10-CM

## 2023-02-14 ENCOUNTER — Other Ambulatory Visit: Payer: Self-pay | Admitting: Cardiology

## 2023-02-14 NOTE — Telephone Encounter (Signed)
Prescription refill request for Eliquis received. Indication: Afib  Last office visit: 11/07/22 [facility])  Scr: 0.88 (05/05/22)  Age: 72 Weight: 65kg  Appropriate dose. Refill sent.

## 2023-02-15 NOTE — Progress Notes (Signed)
[facility] Summary Report / Loop Recorder 

## 2023-02-16 DIAGNOSIS — M810 Age-related osteoporosis without current pathological fracture: Secondary | ICD-10-CM | POA: Diagnosis not present

## 2023-02-16 DIAGNOSIS — G250 Essential tremor: Secondary | ICD-10-CM | POA: Diagnosis not present

## 2023-02-16 DIAGNOSIS — E7800 Pure hypercholesterolemia, unspecified: Secondary | ICD-10-CM | POA: Diagnosis not present

## 2023-02-16 DIAGNOSIS — Z79899 Other long term (current) drug therapy: Secondary | ICD-10-CM | POA: Diagnosis not present

## 2023-02-16 DIAGNOSIS — I4891 Unspecified atrial fibrillation: Secondary | ICD-10-CM | POA: Diagnosis not present

## 2023-02-16 DIAGNOSIS — Z6824 Body mass index (BMI) 24.0-24.9, adult: Secondary | ICD-10-CM | POA: Diagnosis not present

## 2023-03-08 ENCOUNTER — Ambulatory Visit (INDEPENDENT_AMBULATORY_CARE_PROVIDER_SITE_OTHER): Payer: Medicare PPO

## 2023-03-08 DIAGNOSIS — R55 Syncope and collapse: Secondary | ICD-10-CM

## 2023-03-09 LAB — CUP PACEART REMOTE DEVICE CHECK
Date Time Interrogation Session: 20240822230117
Implantable Pulse Generator Implant Date: 20210412

## 2023-03-20 ENCOUNTER — Telehealth: Payer: Self-pay

## 2023-03-20 NOTE — Progress Notes (Signed)
[facility] Summary Report / Loop Recorder 

## 2023-03-21 NOTE — Telephone Encounter (Signed)
Error

## 2023-04-09 ENCOUNTER — Ambulatory Visit (INDEPENDENT_AMBULATORY_CARE_PROVIDER_SITE_OTHER): Payer: Medicare PPO

## 2023-04-09 DIAGNOSIS — R55 Syncope and collapse: Secondary | ICD-10-CM | POA: Diagnosis not present

## 2023-04-11 LAB — CUP PACEART REMOTE DEVICE CHECK
Date Time Interrogation Session: 20240924230043
Implantable Pulse Generator Implant Date: 20210412

## 2023-04-23 NOTE — Progress Notes (Signed)
[facility] Summary Report / Loop Recorder 

## 2023-05-10 ENCOUNTER — Ambulatory Visit (INDEPENDENT_AMBULATORY_CARE_PROVIDER_SITE_OTHER): Payer: Medicare PPO

## 2023-05-10 DIAGNOSIS — R55 Syncope and collapse: Secondary | ICD-10-CM | POA: Diagnosis not present

## 2023-05-10 LAB — CUP PACEART REMOTE DEVICE CHECK
Date Time Interrogation Session: 20241023230218
Implantable Pulse Generator Implant Date: 20210412

## 2023-05-12 ENCOUNTER — Other Ambulatory Visit: Payer: Self-pay | Admitting: Cardiology

## 2023-05-29 NOTE — Progress Notes (Signed)
[facility] Summary Report / Loop Recorder 

## 2023-05-31 DIAGNOSIS — M810 Age-related osteoporosis without current pathological fracture: Secondary | ICD-10-CM | POA: Diagnosis not present

## 2023-05-31 DIAGNOSIS — Z23 Encounter for immunization: Secondary | ICD-10-CM | POA: Diagnosis not present

## 2023-06-11 ENCOUNTER — Ambulatory Visit (INDEPENDENT_AMBULATORY_CARE_PROVIDER_SITE_OTHER): Payer: Medicare PPO

## 2023-06-11 DIAGNOSIS — R55 Syncope and collapse: Secondary | ICD-10-CM

## 2023-06-12 LAB — CUP PACEART REMOTE DEVICE CHECK
Date Time Interrogation Session: 20241125230042
Implantable Pulse Generator Implant Date: 20210412

## 2023-07-09 NOTE — Progress Notes (Signed)
[facility] Summary Report / Loop Recorder 

## 2023-07-09 NOTE — Addendum Note (Signed)
Addended by: Geralyn Flash D on: 07/09/2023 11:00 AM   Modules accepted: Orders

## 2023-07-12 ENCOUNTER — Ambulatory Visit (INDEPENDENT_AMBULATORY_CARE_PROVIDER_SITE_OTHER): Payer: Medicare PPO

## 2023-07-12 DIAGNOSIS — R55 Syncope and collapse: Secondary | ICD-10-CM

## 2023-07-12 LAB — CUP PACEART REMOTE DEVICE CHECK
Date Time Interrogation Session: 20241225230331
Implantable Pulse Generator Implant Date: 20210412

## 2023-07-25 ENCOUNTER — Ambulatory Visit (INDEPENDENT_AMBULATORY_CARE_PROVIDER_SITE_OTHER): Payer: Medicare PPO

## 2023-07-25 ENCOUNTER — Ambulatory Visit: Payer: Medicare PPO | Admitting: Podiatry

## 2023-07-25 DIAGNOSIS — M722 Plantar fascial fibromatosis: Secondary | ICD-10-CM | POA: Diagnosis not present

## 2023-07-25 DIAGNOSIS — L603 Nail dystrophy: Secondary | ICD-10-CM | POA: Diagnosis not present

## 2023-07-25 DIAGNOSIS — R262 Difficulty in walking, not elsewhere classified: Secondary | ICD-10-CM

## 2023-07-25 DIAGNOSIS — M2042 Other hammer toe(s) (acquired), left foot: Secondary | ICD-10-CM | POA: Diagnosis not present

## 2023-07-25 DIAGNOSIS — M79672 Pain in left foot: Secondary | ICD-10-CM

## 2023-07-25 DIAGNOSIS — M19072 Primary osteoarthritis, left ankle and foot: Secondary | ICD-10-CM | POA: Diagnosis not present

## 2023-07-25 NOTE — Progress Notes (Signed)
 Chief Complaint  Patient presents with   Foot Pain    Left foot pain, bi lateral 2nd hammer toes.    HPI: 73 y.o. female presents today with several concerns:  Pain along the left dorsolateral midfoot.  Had a cortisone injection to the area 8 months ago and notes no improvement.  Has some skin discoloration in the area of the injection.  Denies injury.  Notes this can be quite painful and make it difficult to walk C/o left great toenail fungus. C/o thickened toenails on bilateral 2nd toes with hard skin at tip of toes C/o hammertoe deformities bilateral.    Past Medical History:  Diagnosis Date   Abnormal mammogram of right breast 10/2016   Allergy to pollen 09/26/2018   Anxiety and depression 02/01/2011   Aortic atherosclerosis (HCC)    Arthritis    right Thumb   Atrophic vaginitis 04/28/2020   Chest pain in adult    Normal MPS 2016   Chronic anticoagulation 09/09/2018   Complication of anesthesia    prolonged sedation   Dental crowns present    Dupuytren contracture    Bilateral hands, mild   GERD (gastroesophageal reflux disease)    Gestational diabetes    history of   Grade I diastolic dysfunction 05/09/2017   on ECHO   Heart palpitations 10/13/2010   Overview:  Last Assessment & Plan:  Long hx of same - controlled with beta-blocker  ?orthostatic symptoms in AM with low normal BP - pt will monitor to see if symptoms correlate with BP reading No changes recommended today - reassured re: cards recommendation to take extra 1/2 tab prn symptoms    Hepatitis    age 78 infectious   High risk medication use 09/09/2018   History of blood transfusion 1982   History of cardiomegaly 04/14/2017   Noted on CXR   History of hepatitis as a child    infectious, per pt.   History of hiatal hernia    History of pleurisy    History of pneumonia    Hypercholesterolemia    Overview:  Last Assessment & Plan:  On statin Well controlled last check, monitor annually The current  medical regimen is effective;  continue present plan and medications.    Hyperlipidemia    Irregular heartbeat    since age 50, per pt.   Itchy eyes 09/15/2019   Leg pain, right 03/20/2012   Limited joint range of motion    cervical spine - difficulty looking up for extended period   MR (mitral regurgitation) 05/09/2017   Mild, noted on ECHO   MVP (mitral valve prolapse)    Mild   Myalgia due to statin 03/07/2018   Orthostatic hypotension    Osteoporosis    Osteoporosis, post-menopausal    hx RLE fib fx 03/2012 following fall DEXA 03/13/13 @ [facility]: -2.5 at spine    PAF (paroxysmal atrial fibrillation) (HCC) 09/04/2018   Pleuritic chest pain 08/24/2014   Pleuritis 04/17/2017   PONV (postoperative nausea and vomiting)    Postural dizziness 02/01/2011   Pulmonary nodule 04/18/2017   5 mm left lower lobe nodule    Right thyroid  nodule 09/26/2018   Seasonal allergies    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   SUI (stress urinary incontinence, female)    SVT (supraventricular tachycardia)    Syncope 08/21/2018   Tinnitus of both ears 04/26/2020   TR (tricuspid regurgitation) 05/09/2017   Mild, noted on ECHO  Urinary tract infectious disease 04/28/2020   Wears contact lenses    Wears glasses     Past Surgical History:  Procedure Laterality Date   ANTERIOR CERVICAL DECOMP/DISCECTOMY FUSION  02/03/2002   C5-6   BIOPSY  08/08/2022   Procedure: BIOPSY;  Surgeon: Dianna Specking, MD;  Location: [facility];  Service: Gastroenterology;;   BLADDER SUSPENSION N/A 04/26/2018   Procedure: TRANSVAGINAL TAPE (TVT) PROCEDURE;  Surgeon: Sarrah Browning, MD;  Location: [facility];  Service: Gynecology;  Laterality: N/A;   CESAREAN SECTION     x 3   COLONOSCOPY  2013   several   COLONOSCOPY WITH PROPOFOL  N/A 08/08/2022   Procedure: COLONOSCOPY WITH PROPOFOL ;  Surgeon: Dianna Specking, MD;  Location: [facility];  Service: Gastroenterology;  Laterality: N/A;    CYSTOCELE REPAIR N/A 04/26/2018   Procedure: ANTERIOR REPAIR (CYSTOCELE);  Surgeon: Sarrah Browning, MD;  Location: [facility];  Service: Gynecology;  Laterality: N/A;   CYSTOSCOPY N/A 04/26/2018   Procedure: CYSTOSCOPY;  Surgeon: Sarrah Browning, MD;  Location: [facility];  Service: Gynecology;  Laterality: N/A;   INGUINAL HERNIA REPAIR Right    LAPAROSCOPIC NISSEN FUNDOPLICATION  07/07/2003   NISSEN FUNDOPLICATION  08/14/2007   redo   RADIOACTIVE SEED GUIDED EXCISIONAL BREAST BIOPSY Right 11/08/2016   Procedure: RADIOACTIVE SEED GUIDED EXCISIONAL RIGHT BREAST BIOPSY;  Surgeon: Donnice Bury, MD;  Location:  SUR[facility]ERY CENTER;  Service: General;  Laterality: Right;   TONSILLECTOMY  1977   TUBAL LIGATION     UPPER GI ENDOSCOPY     x2   WISDOM TOOTH EXTRACTION      Allergies  Allergen Reactions   Codeine Nausea And Vomiting, Nausea Only and Other (See Comments)    Other Reaction(s): GI Intolerance, Other (See Comments)   E.E.S. [Erythromycin] Other (See Comments)    TACHYCARDIA    Zocor [Simvastatin] Other (See Comments)    Leg pain   Other     Difficulty waking from anesthesia   Penicillins Rash   Review of Systems  Musculoskeletal:  Positive for joint pain.     Physical Exam: General: The patient is alert and oriented x3 in no acute distress.  Dermatology: Skin is warm, dry and supple bilateral lower extremities. Interspaces are clear of maceration and debris.  Left hallux toenail with yellow discoloration, distal onycholysis, subungual debris and pain with compression.  Bilateral 2nd toenails appear dystrophic secondary to pressure from toe contracture position.  Hyperpigmentation to dorsolateral left 4th/5th metatarsal, just distal to the TMT joints.  No actual distal corns on 2nd toes, but skin is more firm due to increased pressure.   Vascular: Palpable pedal pulses bilaterally. Capillary refill within normal limits.  No  appreciable edema.  No erythema or calor.  Neurological: Light touch sensation grossly intact bilateral feet.   Musculoskeletal Exam: Pain on palpation dorsal aspect of left 4th and 5th metatarsal - cuboid joints.  Severely flattened midfoot left foot.  Contractures of lesser toes at PIPJ's with adductovarus deformities.  Ankle df less than 10 degrees with knee extended.    Radiographic Exam (left foot, 3 wb views, 07/25/23):  Normal osseous mineralization. Inferior calcaneal spur noted.  Significant collapse of midfoot.  Navicular - medial cuneiform fault.  Jt. Space narrowing at 4th and 5th metatarsal - cuboid joints. Decreased calcaneal inclination angle and increased talar declination angle.  Medial angulation and contractures of all lesser toes at PIPJ joints.  Long 2nd metatarsal.   Assessment/Plan of Care: 1.  Foot pain, left   2. Nail dystrophy   3. Osteoarthritis of left ankle and foot   4. Hammertoe of left foot   5. Difficulty walking     PR PNEUMAT WALKING BOOT PRE CST ORTHOTICS, PODIATRY (AMBULATORY)  Discussed clinical and radiographic findings with patient today.  Clippings of the left hallux toenail were obtained and sent to [facility] labs for fungal culture.  Will call patient to review results and discuss treatment options.  Informed patient the 2nd toenails appear dystrophic due to the increased pressure on the nails from the semi-rigid hammertoes on the 2nd toes.  This will also cause thickened skin on distal 2nd toe, which could eventually become corns.  Make sure shoes are deep enough to accommodate the hammertoes and not add more pressure to the tips of the toes.  We can discuss the hammertoes in more detail at a future visit due to time constraints.    Did not offer a cortisone injection to the left dorsolateral midfoot due to the skin hyperpigmentation in area of last injection 8 months ago.  Due to her progressively worsening pain and limited ambulation secondary to pain,  she was fitted for a pneumatic prefabricated camwalker to wear at all times WB. Remove for sleeping and bathing.  She noted immediate improvement of pain upon standing with the camwalker in place.  Will have her wear this for the next 2-3 weeks and will recheck around that time.   She takes Eloquis, so will hold off on any Rx NSAIDs.  She can take Tylenol  Arthritis as needed for pain, not to exceed daily recommended amount on product packaging.    This visit involved more than 45 minutes of face-to-face time addressing her multiple issues, including review of extensive PMHx/medications and radiographs today, performing lower extremity exam, discussing treatment options, fitting and dispensing of pneumatic camwalker for left foot, and discussing need for & ordering custom molded orthotics in the future once she comes out of the camwalker.     Awanda CHARM Imperial, DPM, FACFAS [facility]     [address] [place], [place] [address]                Office [phone]  Fax [phone]

## 2023-07-27 ENCOUNTER — Other Ambulatory Visit: Payer: Self-pay | Admitting: Medical Genetics

## 2023-08-01 DIAGNOSIS — Z23 Encounter for immunization: Secondary | ICD-10-CM | POA: Diagnosis not present

## 2023-08-03 ENCOUNTER — Other Ambulatory Visit: Payer: Self-pay

## 2023-08-04 ENCOUNTER — Encounter: Payer: Self-pay | Admitting: Podiatry

## 2023-08-08 NOTE — Progress Notes (Unsigned)
Cardiology Office Note:    Date:  08/09/2023   ID:  [name], [name] 07-Aug-1950, MRN [id number]  PCP:  Buckner Malta, MD  Cardiologist:  Norman Herrlich, MD    Referring MD: Buckner Malta, MD please do a Flecanide level with her wellness labs coming up in February copy to me.   ASSESSMENT:    1. PAF (paroxysmal atrial fibrillation) (HCC)   2. High risk medication use   3. Chronic anticoagulation    PLAN:    In order of problems listed above:  Overall doing well maintaining sinus rhythm continue flecainide On a beta-blocker for tremor ask her to take her second dose of propranolol at bedtime she has some breakthrough brief palpitation in the evenings Continue her current anticoagulant   Next appointment: 9 months   Medication Adjustments/Labs and Tests Ordered: Current medicines are reviewed at length with the patient today.  Concerns regarding medicines are outlined above.  Orders Placed This Encounter  Procedures   EKG 12-Lead   No orders of the defined types were placed in this encounter.    History of Present Illness:    Tina [name] is a 73 y.o. female with a hx of paroxysmal atrial fibrillation maintaining sinus rhythm on low-dose flecainide not on a beta-blocker due to bradycardia and chronic anticoagulation.  She has implanted loop recorder and is followed in our device clinic.  She was last seen 11/07/2022.  Last device check 07/11/2023 showed no new arrhythmia.  Atrial fibrillation burden 0%.  Right bundle branch block was present on the previous EKG April 2024  Compliance with diet, lifestyle and medications: Yes  She is not doing well she is wearing a boot it sounds like she had a stress fracture spontaneous in the left foot Occasionally she gets brief palpitation at nighttime she takes propranolol for treatment of asked her to take her second dose of the day at bedtime If more is frequent or severe we need to do an ambulatory monitor He has labs  coming up in 1 week with her PCP and I will ask them to draw flecainide level she may require higher dose Without a lab test I would increase flecainide with bundle branch block No severe or sustained arrhythmia syncope edema shortness of breath chest pain Past Medical History:  Diagnosis Date   Abnormal mammogram of right breast 10/2016   Allergy to pollen 09/26/2018   Anxiety and depression 02/01/2011   Aortic atherosclerosis (HCC)    Arthritis    right Thumb   Atrophic vaginitis 04/28/2020   Chest pain in adult    Normal MPS 2016   Chronic anticoagulation 09/09/2018   Complication of anesthesia    prolonged sedation   Dental crowns present    Dupuytren contracture    Bilateral hands, mild   GERD (gastroesophageal reflux disease)    Gestational diabetes    history of   Grade I diastolic dysfunction 05/09/2017   on ECHO   Heart palpitations 10/13/2010   Overview:  Last Assessment & Plan:  Long hx of same - controlled with beta-blocker  ?orthostatic symptoms in AM with low normal BP - pt will monitor to see if symptoms correlate with BP reading No changes recommended today - reassured re: cards recommendation to take extra 1/2 tab prn symptoms    Hepatitis    age 25 "infectious"   High risk medication use 09/09/2018   History of blood transfusion 1982   History of cardiomegaly 04/14/2017   Noted on  CXR   History of hepatitis as a child    "infectious", per pt.   History of hiatal hernia    History of pleurisy    History of pneumonia    Hypercholesterolemia    Overview:  Last Assessment & Plan:  On statin Well controlled last check, monitor annually The current medical regimen is effective;  continue present plan and medications.    Hyperlipidemia    Irregular heartbeat    since age 52, per pt.   Itchy eyes 09/15/2019   Leg pain, right 03/20/2012   Limited joint range of motion    cervical spine - difficulty looking up for extended period   MR (mitral regurgitation) 05/09/2017    Mild, noted on ECHO   MVP (mitral valve prolapse)    Mild   Myalgia due to statin 03/07/2018   Orthostatic hypotension    Osteoporosis    Osteoporosis, post-menopausal    hx RLE fib fx 03/2012 following fall DEXA 03/13/13 @ [facility]: -2.5 at spine    PAF (paroxysmal atrial fibrillation) (HCC) 09/04/2018   Pleuritic chest pain 08/24/2014   Pleuritis 04/17/2017   PONV (postoperative nausea and vomiting)    Postural dizziness 02/01/2011   Pulmonary nodule 04/18/2017   5 mm left lower lobe nodule    Right thyroid nodule 09/26/2018   Seasonal allergies    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   SUI (stress urinary incontinence, female)    SVT (supraventricular tachycardia) (HCC)    Syncope 08/21/2018   Tinnitus of both ears 04/26/2020   TR (tricuspid regurgitation) 05/09/2017   Mild, noted on ECHO   Urinary tract infectious disease 04/28/2020   Wears contact lenses    Wears glasses     Current Medications: Current Meds  Medication Sig   acetaminophen (TYLENOL) 325 MG tablet Take 325 mg by mouth every 6 (six) hours as needed for pain or headache.   calcium-vitamin D (OSCAL WITH D) 500-5 MG-MCG tablet Take 1 tablet by mouth daily. 650 mg   cetirizine (ZYRTEC) 10 MG tablet TAKE 1 TABLET (10 MG TOTAL) BY MOUTH DAILY.   Coenzyme Q10 (COQ-10 PO) Take 200 mg by mouth in the morning.   denosumab (PROLIA) 60 MG/ML SOSY injection Inject 60 mg into the skin every 6 (six) months.   ELIQUIS 5 MG TABS tablet TAKE 1 TABLET BY MOUTH TWICE A DAY   ezetimibe (ZETIA) 10 MG tablet Take 0.5 tablets (5 mg total) by mouth daily.   famotidine (PEPCID) 20 MG tablet Take 20 mg by mouth in the morning and at bedtime.   flecainide (TAMBOCOR) 50 MG tablet Take 1 tablet (50 mg total) by mouth 2 (two) times daily.   fluticasone (FLONASE ALLERGY RELIEF) 50 MCG/ACT nasal spray Place 1 spray into both nostrils daily as needed for allergies or rhinitis.   fluticasone (FLOVENT HFA) 44 MCG/ACT inhaler Inhale 2  puffs into the lungs 2 (two) times daily.   meclizine (ANTIVERT) 25 MG tablet Take 25 mg by mouth daily as needed for dizziness.   MEGARED OMEGA-3 KRILL OIL PO Take 800 mg by mouth in the morning.   montelukast (SINGULAIR) 10 MG tablet Take 10 mg by mouth at bedtime.   Multiple Vitamin (MULTIVITAMIN WITH MINERALS) TABS tablet Take 1 tablet by mouth in the morning.   Omega-3 Fatty Acids (FISH OIL) 1200 MG CAPS Take 1 capsule by mouth daily.   pravastatin (PRAVACHOL) 20 MG tablet TAKE 1 TABLET BY MOUTH EVERY DAY IN THE  EVENING   propranolol (INDERAL) 10 MG tablet Take 10 mg by mouth 2 (two) times daily.   [DISCONTINUED] EPINEPHrine 0.3 mg/0.3 mL IJ SOAJ injection Inject 0.3 mg into the muscle as needed for anaphylaxis.   [DISCONTINUED] latanoprost (XALATAN) 0.005 % ophthalmic solution Place 1 drop into both eyes at bedtime.   [DISCONTINUED] olopatadine (PATANOL) 0.1 % ophthalmic solution Place 1 drop into both eyes 2 (two) times daily.      EKGs/Labs/Other Studies Reviewed:    The following studies were reviewed today:     Her EKG today shows sinus rhythm right bundle branch block unchanged from April 2024 and stable QRS duration final report is in epic for some reason I am unable to place it in this note Recent lab 08/17/2022 cholesterol 159 LDL 88 A1c 5.4% hemoglobin 13.5 creatinine 0.70 potassium 3.9   Physical Exam:    VS:  BP 110/68   Pulse 80   Ht 5\' 4"  (1.626 m)   Wt 145 lb 9.6 oz (66 kg)   SpO2 93%   BMI 24.99 kg/m     Wt Readings from Last 3 Encounters:  08/09/23 145 lb 9.6 oz (66 kg)  11/07/22 143 lb 3.2 oz (65 kg)  08/08/22 137 lb (62.1 kg)     GEN:  Well nourished, well developed in no acute distress HEENT: Normal NECK: No JVD; No carotid bruits LYMPHATICS: No lymphadenopathy CARDIAC: RRR, no murmurs, rubs, gallops RESPIRATORY:  Clear to auscultation without rales, wheezing or rhonchi  ABDOMEN: Soft, non-tender, non-distended MUSCULOSKELETAL:  No edema; No  deformity  SKIN: Warm and dry NEUROLOGIC:  Alert and oriented x 3 PSYCHIATRIC:  Normal affect    Signed, Norman Herrlich, MD  08/09/2023 2:49 PM    [facility]

## 2023-08-09 ENCOUNTER — Encounter: Payer: Self-pay | Admitting: Cardiology

## 2023-08-09 ENCOUNTER — Ambulatory Visit: Payer: Medicare PPO | Attending: Cardiology | Admitting: Cardiology

## 2023-08-09 ENCOUNTER — Other Ambulatory Visit: Payer: Self-pay | Admitting: Cardiology

## 2023-08-09 VITALS — BP 110/68 | HR 80 | Ht 64.0 in | Wt 145.6 lb

## 2023-08-09 DIAGNOSIS — I480 Paroxysmal atrial fibrillation: Secondary | ICD-10-CM

## 2023-08-09 DIAGNOSIS — Z7901 Long term (current) use of anticoagulants: Secondary | ICD-10-CM | POA: Diagnosis not present

## 2023-08-09 DIAGNOSIS — Z79899 Other long term (current) drug therapy: Secondary | ICD-10-CM | POA: Diagnosis not present

## 2023-08-09 NOTE — Telephone Encounter (Signed)
Eliquis 5mg  refill request received. Patient is 73 years old, weight-65kg, Crea-0.70 on 08/17/22 via [facility] from [place], [place], and last seen by Dr. Dulce Sellar on 11/07/22. Dose is appropriate based on dosing criteria.

## 2023-08-09 NOTE — Patient Instructions (Signed)
Medication Instructions:  Your physician recommends that you continue on your current medications as directed. Please refer to the Current Medication list given to you today.  *If you need a refill on your cardiac medications before your next appointment, please call your pharmacy*   Lab Work: None If you have labs (blood work) drawn today and your tests are completely normal, you will receive your results only by: MyChart Message (if you have MyChart) OR A paper copy in the mail If you have any lab test that is abnormal or we need to change your treatment, we will call you to review the results.   Testing/Procedures: None   Follow-Up: At [facility], you and your health needs are our priority.  As part of our continuing mission to provide you with exceptional heart care, we have created designated Provider Care Teams.  These Care Teams include your primary Cardiologist (physician) and Advanced Practice Providers (APPs -  Physician Assistants and Nurse Practitioners) who all work together to provide you with the care you need, when you need it.  We recommend signing up for the patient portal called "MyChart".  Sign up information is provided on this After Visit Summary.  MyChart is used to connect with patients for Virtual Visits (Telemedicine).  Patients are able to view lab/test results, encounter notes, upcoming appointments, etc.  Non-urgent messages can be sent to your provider as well.   To learn more about what you can do with MyChart, go to [email or web address].    Your next appointment:   9 month(s)  Provider:   Norman Herrlich, MD    Other Instructions Do a flecainide level with wellness exam.

## 2023-08-13 ENCOUNTER — Ambulatory Visit (INDEPENDENT_AMBULATORY_CARE_PROVIDER_SITE_OTHER): Payer: Medicare PPO

## 2023-08-13 DIAGNOSIS — R55 Syncope and collapse: Secondary | ICD-10-CM

## 2023-08-13 LAB — CUP PACEART REMOTE DEVICE CHECK
Date Time Interrogation Session: 20250126230128
Implantable Pulse Generator Implant Date: 20210412

## 2023-08-20 ENCOUNTER — Encounter: Payer: Self-pay | Admitting: Cardiology

## 2023-08-20 DIAGNOSIS — Z0000 Encounter for general adult medical examination without abnormal findings: Secondary | ICD-10-CM | POA: Diagnosis not present

## 2023-08-20 DIAGNOSIS — K219 Gastro-esophageal reflux disease without esophagitis: Secondary | ICD-10-CM | POA: Diagnosis not present

## 2023-08-20 DIAGNOSIS — E7800 Pure hypercholesterolemia, unspecified: Secondary | ICD-10-CM | POA: Diagnosis not present

## 2023-08-20 DIAGNOSIS — M810 Age-related osteoporosis without current pathological fracture: Secondary | ICD-10-CM | POA: Diagnosis not present

## 2023-08-20 DIAGNOSIS — R42 Dizziness and giddiness: Secondary | ICD-10-CM | POA: Diagnosis not present

## 2023-08-20 DIAGNOSIS — Z79899 Other long term (current) drug therapy: Secondary | ICD-10-CM

## 2023-08-20 DIAGNOSIS — I4891 Unspecified atrial fibrillation: Secondary | ICD-10-CM | POA: Diagnosis not present

## 2023-08-20 DIAGNOSIS — Z1231 Encounter for screening mammogram for malignant neoplasm of breast: Secondary | ICD-10-CM | POA: Diagnosis not present

## 2023-08-20 DIAGNOSIS — M8589 Other specified disorders of bone density and structure, multiple sites: Secondary | ICD-10-CM | POA: Diagnosis not present

## 2023-08-20 DIAGNOSIS — Z1331 Encounter for screening for depression: Secondary | ICD-10-CM | POA: Diagnosis not present

## 2023-08-20 DIAGNOSIS — R7302 Impaired glucose tolerance (oral): Secondary | ICD-10-CM | POA: Diagnosis not present

## 2023-08-20 LAB — HM DEXA SCAN

## 2023-08-22 ENCOUNTER — Ambulatory Visit: Payer: Medicare PPO | Admitting: Podiatry

## 2023-08-22 DIAGNOSIS — Z79899 Other long term (current) drug therapy: Secondary | ICD-10-CM | POA: Diagnosis not present

## 2023-08-22 DIAGNOSIS — M79672 Pain in left foot: Secondary | ICD-10-CM

## 2023-08-22 DIAGNOSIS — S96912D Strain of unspecified muscle and tendon at ankle and foot level, left foot, subsequent encounter: Secondary | ICD-10-CM | POA: Diagnosis not present

## 2023-08-22 DIAGNOSIS — Z1231 Encounter for screening mammogram for malignant neoplasm of breast: Secondary | ICD-10-CM | POA: Diagnosis not present

## 2023-08-22 NOTE — Progress Notes (Signed)
 Chief Complaint  Patient presents with   Foot Pain    Left foot, she states today that she feel like it is much better.  It even helped her hammer toe to stay down while in the boot. The stinging pain is gone, but does have occasional twinges. Not diabetic , takes Elliquis.    HPI: 73 y.o. female presents today for follow-up of pain along the lateral left midfoot.  She states that her pain is almost resolved.  She has been wearing the cam walker and has felt much better walking around with this on.  Past Medical History:  Diagnosis Date   Abnormal mammogram of right breast 10/2016   Allergy to pollen 09/26/2018   Anxiety and depression 02/01/2011   Aortic atherosclerosis (HCC)    Arthritis    right Thumb   Atrophic vaginitis 04/28/2020   Chest pain in adult    Normal MPS 2016   Chronic anticoagulation 09/09/2018   Complication of anesthesia    prolonged sedation   Dental crowns present    Dupuytren contracture    Bilateral hands, mild   GERD (gastroesophageal reflux disease)    Gestational diabetes    history of   Grade I diastolic dysfunction 05/09/2017   on ECHO   Heart palpitations 10/13/2010   Overview:  Last Assessment & Plan:  Long hx of same - controlled with beta-blocker  ?orthostatic symptoms in AM with low normal BP - pt will monitor to see if symptoms correlate with BP reading No changes recommended today - reassured re: cards recommendation to take extra 1/2 tab prn symptoms    Hepatitis    age 76 infectious   High risk medication use 09/09/2018   History of blood transfusion 1982   History of cardiomegaly 04/14/2017   Noted on CXR   History of hepatitis as a child    infectious, per pt.   History of hiatal hernia    History of pleurisy    History of pneumonia    Hypercholesterolemia    Overview:  Last Assessment & Plan:  On statin Well controlled last check, monitor annually The current medical regimen is effective;  continue present plan and medications.     Hyperlipidemia    Irregular heartbeat    since age 36, per pt.   Itchy eyes 09/15/2019   Leg pain, right 03/20/2012   Limited joint range of motion    cervical spine - difficulty looking up for extended period   MR (mitral regurgitation) 05/09/2017   Mild, noted on ECHO   MVP (mitral valve prolapse)    Mild   Myalgia due to statin 03/07/2018   Orthostatic hypotension    Osteoporosis    Osteoporosis, post-menopausal    hx RLE fib fx 03/2012 following fall DEXA 03/13/13 @ [facility]: -2.5 at spine    PAF (paroxysmal atrial fibrillation) (HCC) 09/04/2018   Pleuritic chest pain 08/24/2014   Pleuritis 04/17/2017   PONV (postoperative nausea and vomiting)    Postural dizziness 02/01/2011   Pulmonary nodule 04/18/2017   5 mm left lower lobe nodule    Right thyroid  nodule 09/26/2018   Seasonal allergies    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   SUI (stress urinary incontinence, female)    SVT (supraventricular tachycardia) (HCC)    Syncope 08/21/2018   Tinnitus of both ears 04/26/2020   TR (tricuspid regurgitation) 05/09/2017   Mild, noted on ECHO   Urinary tract infectious disease 04/28/2020  Wears contact lenses    Wears glasses     Past Surgical History:  Procedure Laterality Date   ANTERIOR CERVICAL DECOMP/DISCECTOMY FUSION  02/03/2002   C5-6   BIOPSY  08/08/2022   Procedure: BIOPSY;  Surgeon: Dianna Specking, MD;  Location: [facility];  Service: Gastroenterology;;   BLADDER SUSPENSION N/A 04/26/2018   Procedure: TRANSVAGINAL TAPE (TVT) PROCEDURE;  Surgeon: Sarrah Browning, MD;  Location: [facility];  Service: Gynecology;  Laterality: N/A;   CESAREAN SECTION     x 3   COLONOSCOPY  2013   several   COLONOSCOPY WITH PROPOFOL  N/A 08/08/2022   Procedure: COLONOSCOPY WITH PROPOFOL ;  Surgeon: Dianna Specking, MD;  Location: [facility];  Service: Gastroenterology;  Laterality: N/A;   CYSTOCELE REPAIR N/A 04/26/2018   Procedure: ANTERIOR REPAIR  (CYSTOCELE);  Surgeon: Sarrah Browning, MD;  Location: [facility];  Service: Gynecology;  Laterality: N/A;   CYSTOSCOPY N/A 04/26/2018   Procedure: CYSTOSCOPY;  Surgeon: Sarrah Browning, MD;  Location: [facility];  Service: Gynecology;  Laterality: N/A;   INGUINAL HERNIA REPAIR Right    LAPAROSCOPIC NISSEN FUNDOPLICATION  07/07/2003   NISSEN FUNDOPLICATION  08/14/2007   redo   RADIOACTIVE SEED GUIDED EXCISIONAL BREAST BIOPSY Right 11/08/2016   Procedure: RADIOACTIVE SEED GUIDED EXCISIONAL RIGHT BREAST BIOPSY;  Surgeon: Donnice Bury, MD;  Location: [facility];  Service: General;  Laterality: Right;   TONSILLECTOMY  1977   TUBAL LIGATION     UPPER GI ENDOSCOPY     x2   WISDOM TOOTH EXTRACTION      Allergies  Allergen Reactions   Codeine Nausea And Vomiting, Nausea Only and Other (See Comments)    Other Reaction(s): GI Intolerance, Other (See Comments)   E.E.S. [Erythromycin] Other (See Comments)    TACHYCARDIA    Zocor [Simvastatin] Other (See Comments)    Leg pain   Other     Difficulty waking from anesthesia   Penicillins Rash    Physical Exam: Palpable pedal pulses noted.  No pain on palpation to the left fifth metatarsal.  Minimal to no pain on palpation around the fifth metatarsal-cuboid joint.  No localized edema or erythema is noted.  The hyperpigmentation to the skin in this area has almost resolved as well.  Pain on palpation to the peroneus brevis tendon.  Assessment/Plan of Care: 1. Strain of left foot, subsequent encounter   2. Foot pain, left     AMB REFERRAL TO PHYSICAL THERAPY  Discussed clinical findings with patient today.  Will place an orthotic consult with our pedorthist as the patient would benefit from custom orthotics.  She may need a lateral wedge applied.  Patient fitted for a closed heel elastic ankle sleeve to provide additional support as she is weaning out of the cam walker.  Will also refer to  physical therapy to address any musculoskeletal deficits while she has been immobilized to decrease any risk of falls.  Follow-up as needed   Awanda CHARM Imperial, DPM, FACFAS [facility]     [address] [place], [place] [address]                Office [phone]  Fax [phone].

## 2023-08-27 ENCOUNTER — Other Ambulatory Visit (HOSPITAL_COMMUNITY): Payer: Self-pay

## 2023-08-29 ENCOUNTER — Encounter: Payer: Self-pay | Admitting: Cardiology

## 2023-08-30 LAB — FLECAINIDE LEVEL

## 2023-09-06 ENCOUNTER — Encounter: Payer: Self-pay | Admitting: Cardiology

## 2023-09-10 DIAGNOSIS — M79672 Pain in left foot: Secondary | ICD-10-CM | POA: Diagnosis not present

## 2023-09-10 DIAGNOSIS — R2689 Other abnormalities of gait and mobility: Secondary | ICD-10-CM | POA: Diagnosis not present

## 2023-09-10 DIAGNOSIS — M25572 Pain in left ankle and joints of left foot: Secondary | ICD-10-CM | POA: Diagnosis not present

## 2023-09-12 DIAGNOSIS — M79672 Pain in left foot: Secondary | ICD-10-CM | POA: Diagnosis not present

## 2023-09-12 DIAGNOSIS — M25572 Pain in left ankle and joints of left foot: Secondary | ICD-10-CM | POA: Diagnosis not present

## 2023-09-12 DIAGNOSIS — R2689 Other abnormalities of gait and mobility: Secondary | ICD-10-CM | POA: Diagnosis not present

## 2023-09-13 ENCOUNTER — Ambulatory Visit (INDEPENDENT_AMBULATORY_CARE_PROVIDER_SITE_OTHER): Payer: Medicare PPO

## 2023-09-13 DIAGNOSIS — R55 Syncope and collapse: Secondary | ICD-10-CM

## 2023-09-17 ENCOUNTER — Ambulatory Visit (INDEPENDENT_AMBULATORY_CARE_PROVIDER_SITE_OTHER)

## 2023-09-17 DIAGNOSIS — R55 Syncope and collapse: Secondary | ICD-10-CM

## 2023-09-17 DIAGNOSIS — R2689 Other abnormalities of gait and mobility: Secondary | ICD-10-CM | POA: Diagnosis not present

## 2023-09-17 DIAGNOSIS — M25572 Pain in left ankle and joints of left foot: Secondary | ICD-10-CM | POA: Diagnosis not present

## 2023-09-17 DIAGNOSIS — M79672 Pain in left foot: Secondary | ICD-10-CM | POA: Diagnosis not present

## 2023-09-19 ENCOUNTER — Other Ambulatory Visit: Payer: Self-pay | Admitting: Cardiology

## 2023-09-19 LAB — CUP PACEART REMOTE DEVICE CHECK
Date Time Interrogation Session: 20250302230200
Implantable Pulse Generator Implant Date: 20210412

## 2023-09-19 NOTE — Telephone Encounter (Signed)
 Prescription sent to pharmacy.

## 2023-09-20 DIAGNOSIS — M25572 Pain in left ankle and joints of left foot: Secondary | ICD-10-CM | POA: Diagnosis not present

## 2023-09-20 DIAGNOSIS — M79672 Pain in left foot: Secondary | ICD-10-CM | POA: Diagnosis not present

## 2023-09-20 DIAGNOSIS — R2689 Other abnormalities of gait and mobility: Secondary | ICD-10-CM | POA: Diagnosis not present

## 2023-09-21 NOTE — Progress Notes (Signed)
 [facility] Summary Report / Loop Recorder

## 2023-09-26 DIAGNOSIS — M79672 Pain in left foot: Secondary | ICD-10-CM | POA: Diagnosis not present

## 2023-09-26 DIAGNOSIS — R2689 Other abnormalities of gait and mobility: Secondary | ICD-10-CM | POA: Diagnosis not present

## 2023-09-26 DIAGNOSIS — M25572 Pain in left ankle and joints of left foot: Secondary | ICD-10-CM | POA: Diagnosis not present

## 2023-09-28 ENCOUNTER — Other Ambulatory Visit (HOSPITAL_COMMUNITY)
Admission: RE | Admit: 2023-09-28 | Discharge: 2023-09-28 | Disposition: A | Discharge status: Home / Self Care | Payer: Self-pay | Source: Ambulatory Visit | Attending: Medical Genetics | Admitting: Medical Genetics

## 2023-10-02 DIAGNOSIS — M810 Age-related osteoporosis without current pathological fracture: Secondary | ICD-10-CM | POA: Diagnosis not present

## 2023-10-03 NOTE — Progress Notes (Signed)
 " Cardiology Office Note:  .   Date:  10/04/2023  ID:  Tina [name], DOB Mar 23, 1951, MRN [id number] PCP: Clemmie Nest, MD  [facility] Providers Cardiologist:  Redell Leiter, MD Electrophysiologist:  Will [name], MD    History of Present Illness: .   Tina [name] is a 73 y.o. female with a past medical history of mitral valve regurgitation, dyslipidemia, PAF on low-dose flecainide  and Eliquis , SVT, COPD, ILR.  09/19/2023 ILR device check no arrhythmias noted on histograms 05/09/2017 echo EF 50-55%, impaired relaxation, mild MR  Most recently evaluated by Dr. Leiter on 08/09/2023 she was maintained in sinus rhythm, she was having episodes of brief palpitations in the evenings and was advised to increase her propranolol to twice daily.  She presents today for follow-up with concerns of tachycardia and dyspnea on exertion.  Regarding the tachycardia, it appears to be increasing with frequency although the duration is not much different than what she has previously experienced in the past.  When reviewing her medication list, it does appear she was previously on Allegra for her allergies and has recently been switched to Xyzal.  Regarding the dyspnea on exertion, she is able to carry out her normal activities but does feel she finds herself at time breathing harder than she should be, especially when she is walking up an incline. She denies chest pain, pnd, orthopnea, n, v, dizziness, syncope, edema, weight gain, or early satiety.   ROS: Review of Systems  Respiratory:  Positive for shortness of breath.   Cardiovascular:  Positive for palpitations.  All other systems reviewed and are negative.    Studies Reviewed: SABRA   EKG Interpretation Date/Time:  Thursday October 04 2023 14:41:22 EDT Ventricular Rate:  83 PR Interval:  174 QRS Duration:  142 QT Interval:  412 QTC Calculation: 484 R Axis:   -6  Text Interpretation: Normal sinus rhythm Right bundle branch block T wave  abnormality, consider lateral ischemia When compared with ECG of 09-Aug-2023 14:26, No significant change was found Confirmed by [name] [phone] on 10/04/2023 2:49:07 PM    Cardiac Studies & Procedures   ______________________________________________________________________________________________   STRESS TESTS  NM MYOCAR MULTI W/SPECT W 02/13/2007   ECHOCARDIOGRAM  ECHOCARDIOGRAM COMPLETE 05/09/2017  Narrative *[facility]* [address] [place], [place] [address] [phone]  ------------------------------------------------------------------- Transthoracic Echocardiography  Patient:    Tina [name] MR #:       [id number] Study Date: 05/09/2017 Gender:     F Age:        71 Height:     165.1 cm Weight:     64.3 kg BSA:        1.72 m^2 Pt. Status: Room:  ATTENDING    Redell Leiter, MD ORDERING     Redell Leiter, MD REFERRING    Redell Leiter, MD SONOGRAPHER  Koren Daring, RDCS PERFORMING   [facility] [place]  cc:  ------------------------------------------------------------------- LV EF: 50% -   55%  ------------------------------------------------------------------- Indications:      Chest pain 786.51.  Supraventricular tachycardia 427.0.  ------------------------------------------------------------------- Study Conclusions  - Left ventricle: The cavity size was normal. Systolic function was normal. The estimated ejection fraction was in the range of 50% to 55%. Wall motion was normal; there were no regional wall motion abnormalities. Doppler parameters are consistent with abnormal left ventricular relaxation (grade 1 diastolic dysfunction). - Mitral valve: Mild prolapse of the anterior leaflet. There was mild regurgitation.  Impressions:  - Low normal  EF. Mild prolaps of the anterior leaflet MV. Mild MR, TR.  ------------------------------------------------------------------- Study data:   Study status:  Routine.  Study  completion:  There were no complications.          Transthoracic echocardiography. M-mode, complete 2D, spectral Doppler, and color Doppler. Birthdate:  Patient birthdate: 1950-08-31.  Age:  Patient is 73 yr old.  Sex:  Gender: female.    BMI: 23.6 kg/m^2.  Blood pressure: 120/60  Patient status:  Outpatient.  Study date:  Study date: 05/09/2017. Study time: 09:09 AM.  Location:  Echo laboratory.  -------------------------------------------------------------------  ------------------------------------------------------------------- Left ventricle:  The cavity size was normal. Systolic function was normal. The estimated ejection fraction was in the range of 50% to 55%. Wall motion was normal; there were no regional wall motion abnormalities. Doppler parameters are consistent with abnormal left ventricular relaxation (grade 1 diastolic dysfunction).  ------------------------------------------------------------------- Aortic valve:   Trileaflet; normal thickness leaflets. Mobility was not restricted.  Doppler:  Transvalvular velocity was within the normal range. There was no stenosis. There was no regurgitation.  ------------------------------------------------------------------- Aorta:  Aortic root: The aortic root was normal in size.  ------------------------------------------------------------------- Mitral valve:  Mobility was not restricted.  Prolapse.  Doppler: Transvalvular velocity was within the normal range. There was no evidence for stenosis. There was mild regurgitation.  ------------------------------------------------------------------- Left atrium:  The atrium was normal in size.  ------------------------------------------------------------------- Right ventricle:  The cavity size was normal. Wall thickness was normal. Systolic function was normal.  ------------------------------------------------------------------- Pulmonic valve:    Structurally normal valve.    Cusp separation was normal.  Doppler:  Transvalvular velocity was within the normal range. There was no evidence for stenosis. There was no regurgitation.  ------------------------------------------------------------------- Tricuspid valve:   Structurally normal valve.    Doppler: Transvalvular velocity was within the normal range. There was mild regurgitation.  ------------------------------------------------------------------- Pulmonary artery:   The main pulmonary artery was normal-sized. Systolic pressure was within the normal range.  ------------------------------------------------------------------- Right atrium:  The atrium was normal in size.  ------------------------------------------------------------------- Pericardium:  There was no pericardial effusion.  ------------------------------------------------------------------- Systemic veins: Inferior vena cava: The vessel was normal in size.  ------------------------------------------------------------------- Measurements  Left ventricle                          Value        Reference LV ID, ED, PLAX chordal                 49.4  mm     43 - 52 LV ID, ES, PLAX chordal          (H)    38.2  mm     23 - 38 LV fx shortening, PLAX chordal   (L)    23    %      >=29 LV PW thickness, ED                     8.79  mm     ---------- IVS/LV PW ratio, ED                     0.86         <=1.3 Stroke volume, 2D                       46    ml     ---------- Stroke volume/bsa, 2D  27    ml/m^2 ---------- LV ejection fraction, 1-p A4C           38    %      ---------- LV end-diastolic volume, 2-p            72    ml     ---------- LV end-systolic volume, 2-p             44    ml     ---------- LV ejection fraction, 2-p               39    %      ---------- Stroke volume, 2-p                      28    ml     ---------- LV end-diastolic volume/bsa, 2-p        42    ml/m^2 ---------- LV end-systolic volume/bsa, 2-p          25    ml/m^2 ---------- Stroke volume/bsa, 2-p                  16.2  ml/m^2 ---------- LV e&', lateral                          8.38  cm/s   ---------- LV E/e&', lateral                        5.81         ---------- LV e&', medial                           7.18  cm/s   ---------- LV E/e&', medial                         6.78         ---------- LV e&', average                          7.78  cm/s   ---------- LV E/e&', average                        6.26         ---------- Longitudinal strain, TDI                20    %      ----------  Ventricular septum                      Value        Reference IVS thickness, ED                       7.59  mm     ----------  LVOT                                    Value        Reference LVOT ID, S                              20    mm     ----------  LVOT area                               3.14  cm^2   ---------- LVOT peak velocity, S                   63.1  cm/s   ---------- LVOT mean velocity, S                   44.5  cm/s   ---------- LVOT VTI, S                             14.5  cm     ----------  Aorta                                   Value        Reference Aortic root ID, ED                      33    mm     ----------  Left atrium                             Value        Reference LA ID, A-P, ES                          36    mm     ---------- LA ID/bsa, A-P                          2.09  cm/m^2 <=2.2 LA volume, S                            71.4  ml     ---------- LA volume/bsa, S                        41.4  ml/m^2 ---------- LA volume, ES, 1-p A4C                  70.8  ml     ---------- LA volume/bsa, ES, 1-p A4C              41.1  ml/m^2 ---------- LA volume, ES, 1-p A2C                  64.9  ml     ---------- LA volume/bsa, ES, 1-p A2C              37.6  ml/m^2 ----------  Mitral valve                            Value        Reference Mitral E-wave peak velocity             48.7  cm/s   ---------- Mitral A-wave peak velocity              64.5  cm/s   ---------- Mitral deceleration time         (H)    239   ms  150 - 230 Mitral E/A ratio, peak                  0.8          ----------  Pulmonary arteries                      Value        Reference PA pressure, S, DP                      27    mm Hg  <=30  Tricuspid valve                         Value        Reference Tricuspid regurg peak velocity          245   cm/s   ---------- Tricuspid peak RV-RA gradient           24    mm Hg  ----------  Right atrium                            Value        Reference RA ID, S-I, ES, A4C              (H)    49.6  mm     34 - 49 RA area, ES, A4C                        16.2  cm^2   8.3 - 19.5 RA volume, ES, A/L                      43.7  ml     ---------- RA volume/bsa, ES, A/L                  25.3  ml/m^2 ----------  Systemic veins                          Value        Reference Estimated CVP                           3     mm Hg  ----------  Right ventricle                         Value        Reference TAPSE                                   21.2  mm     ---------- RV pressure, S, DP                      27    mm Hg  <=30 RV s&', lateral, S                       11.6  cm/s   ----------  Legend: (L)  and  (H)  mark values outside specified reference range.  ------------------------------------------------------------------- Prepared and Electronically Authenticated by  Lamar Fitch, MD 2018-10-24T13:53:12    MONITORS  LONG TERM MONITOR (3-14 DAYS) 08/23/2018  Narrative  A 3-day ZIO monitor was performed to evaluate syncope and SVT initiating 08/23/2018.  The predominant rhythm is sinus with minimum average and maximum heart rates of 59, 76 and 127 bpm.  Atrial fibrillation occurred 4% burden average rate 89 bpm minimum maximum 65 and 149 bpm.  The atrial fibrillation was symptomatic with palpitation.  There were no pauses of 3 seconds or greater and there was no AV block or sinus node exit block.  Rare  ventricular ectopy was seen less than 1% there was a strip present as ventricular tachycardia which was a brief run of atrial premature beats.  Rare supraventricular ectopy is seen along with brief runs of APCs and a 4% burden of atrial fibrillation or atypical atrial flutter.  Triggered events were present predominantly with supraventricular and ventricular ectopy.    Conclusion paroxysmal atrial fibrillation 4% burden       ______________________________________________________________________________________________      Risk Assessment/Calculations:    CHA2DS2-VASc Score = 3   This indicates a 3.2% annual risk of stroke. The patient's score is based upon: CHF History: 0 HTN History: 0 Diabetes History: 0 Stroke History: 0 Vascular Disease History: 1 Age Score: 1 Gender Score: 1            Physical Exam:   VS:  BP 104/72   Pulse 83   Ht 5' 4 (1.626 m)   Wt 146 lb 12.8 oz (66.6 kg)   SpO2 95%   BMI 25.20 kg/m    Wt Readings from Last 3 Encounters:  10/04/23 146 lb 12.8 oz (66.6 kg)  08/09/23 145 lb 9.6 oz (66 kg)  11/07/22 143 lb 3.2 oz (65 kg)    GEN: Well nourished, well developed in no acute distress NECK: No JVD; No carotid bruits CARDIAC: RRR, no murmurs, rubs, gallops RESPIRATORY:  Clear to auscultation without rales, wheezing or rhonchi  ABDOMEN: Soft, non-tender, non-distended EXTREMITIES:  No edema; No deformity   ASSESSMENT AND PLAN: .   Paroxysmal atrial fibrillation/hypercoagulable state/on flecainide  therapy-CHA2DS2-VASc score of 3, she is maintaining sinus rhythm.  Continue Eliquis  5 mg twice daily--no indication for dose reduction age of 57, weight 146, creatinine 0.88.  Continue flecainide --QRS interval is unchanged-as she has a known right bundle branch block--flecainide  dose cannot be increased secondary to this.  Palpitations/tachycardia-this is the purpose of her visit today, she notices at times her heart rate on her Fitbit and Crist  mobile will increase into the 110, 130 range, typically short-lived--tracings on her Crist mobile reveal sinus tachycardia.  We did discuss we could start a low-dose calcium  channel blocker as needed however she does not think it is needed now as her symptoms quickly subside.  She is also recently started taking Xyzal for her allergies, which can also increase her heart rate and contribute to her palpitations.  RBBB-chronic, since 2020.  DOE-this has been bothersome for her over the last few months, she has not had a echocardiogram and sometimes we will repeat this for any contributory causes.       Dispo: Echo, follow up in 4 months.   Signed, Delon JAYSON Hoover, NP  "

## 2023-10-04 ENCOUNTER — Encounter: Payer: Self-pay | Admitting: Cardiology

## 2023-10-04 ENCOUNTER — Ambulatory Visit: Attending: Cardiology | Admitting: Cardiology

## 2023-10-04 VITALS — BP 104/72 | HR 83 | Ht 64.0 in | Wt 146.8 lb

## 2023-10-04 DIAGNOSIS — Z79899 Other long term (current) drug therapy: Secondary | ICD-10-CM

## 2023-10-04 DIAGNOSIS — Z7901 Long term (current) use of anticoagulants: Secondary | ICD-10-CM | POA: Diagnosis not present

## 2023-10-04 DIAGNOSIS — I471 Supraventricular tachycardia, unspecified: Secondary | ICD-10-CM

## 2023-10-04 DIAGNOSIS — I4510 Unspecified right bundle-branch block: Secondary | ICD-10-CM | POA: Diagnosis not present

## 2023-10-04 DIAGNOSIS — R0600 Dyspnea, unspecified: Secondary | ICD-10-CM

## 2023-10-04 DIAGNOSIS — M79672 Pain in left foot: Secondary | ICD-10-CM | POA: Diagnosis not present

## 2023-10-04 DIAGNOSIS — M25572 Pain in left ankle and joints of left foot: Secondary | ICD-10-CM | POA: Diagnosis not present

## 2023-10-04 DIAGNOSIS — I480 Paroxysmal atrial fibrillation: Secondary | ICD-10-CM | POA: Diagnosis not present

## 2023-10-04 DIAGNOSIS — R2689 Other abnormalities of gait and mobility: Secondary | ICD-10-CM | POA: Diagnosis not present

## 2023-10-04 NOTE — Patient Instructions (Signed)
 Medication Instructions:  Your physician recommends that you continue on your current medications as directed. Please refer to the Current Medication list given to you today.  *If you need a refill on your cardiac medications before your next appointment, please call your pharmacy*   Lab Work: NONE If you have labs (blood work) drawn today and your tests are completely normal, you will receive your results only by: MyChart Message (if you have MyChart) OR A paper copy in the mail If you have any lab test that is abnormal or we need to change your treatment, we will call you to review the results.   Testing/Procedures: Your physician has requested that you have an echocardiogram. Echocardiography is a painless test that uses sound waves to create images of your heart. It provides your doctor with information about the size and shape of your heart and how well your heart's chambers and valves are working. This procedure takes approximately one hour. There are no restrictions for this procedure. Please do NOT wear cologne, perfume, aftershave, or lotions (deodorant is allowed). Please arrive 15 minutes prior to your appointment time.  Please note: We ask at that you not bring children with you during ultrasound (echo/ vascular) testing. Due to room size and safety concerns, children are not allowed in the ultrasound rooms during exams. Our front office staff cannot provide observation of children in our lobby area while testing is being conducted. An adult accompanying a patient to their appointment will only be allowed in the ultrasound room at the discretion of the ultrasound technician under special circumstances. We apologize for any inconvenience.    Follow-Up: At [facility], you and your health needs are our priority.  As part of our continuing mission to provide you with exceptional heart care, we have created designated Provider Care Teams.  These Care Teams include your  primary Cardiologist (physician) and Advanced Practice Providers (APPs -  Physician Assistants and Nurse Practitioners) who all work together to provide you with the care you need, when you need it.  We recommend signing up for the patient portal called "MyChart".  Sign up information is provided on this After Visit Summary.  MyChart is used to connect with patients for Virtual Visits (Telemedicine).  Patients are able to view lab/test results, encounter notes, upcoming appointments, etc.  Non-urgent messages can be sent to your provider as well.   To learn more about what you can do with MyChart, go to [email or web address].    Your next appointment:   4 month(s)  Provider:   Norman Herrlich, MD    Other Instructions

## 2023-10-09 LAB — GENECONNECT MOLECULAR SCREEN: Genetic Analysis Overall Interpretation: NEGATIVE

## 2023-10-11 ENCOUNTER — Other Ambulatory Visit: Payer: Self-pay | Admitting: Family Medicine

## 2023-10-11 DIAGNOSIS — M25572 Pain in left ankle and joints of left foot: Secondary | ICD-10-CM | POA: Diagnosis not present

## 2023-10-11 DIAGNOSIS — M79672 Pain in left foot: Secondary | ICD-10-CM | POA: Diagnosis not present

## 2023-10-11 DIAGNOSIS — D225 Melanocytic nevi of trunk: Secondary | ICD-10-CM | POA: Diagnosis not present

## 2023-10-11 DIAGNOSIS — R928 Other abnormal and inconclusive findings on diagnostic imaging of breast: Secondary | ICD-10-CM

## 2023-10-11 DIAGNOSIS — L814 Other melanin hyperpigmentation: Secondary | ICD-10-CM | POA: Diagnosis not present

## 2023-10-11 DIAGNOSIS — L821 Other seborrheic keratosis: Secondary | ICD-10-CM | POA: Diagnosis not present

## 2023-10-11 DIAGNOSIS — D2239 Melanocytic nevi of other parts of face: Secondary | ICD-10-CM | POA: Diagnosis not present

## 2023-10-11 DIAGNOSIS — L820 Inflamed seborrheic keratosis: Secondary | ICD-10-CM | POA: Diagnosis not present

## 2023-10-11 DIAGNOSIS — R2689 Other abnormalities of gait and mobility: Secondary | ICD-10-CM | POA: Diagnosis not present

## 2023-10-16 NOTE — Addendum Note (Signed)
 Addended by: Elease Etienne A on: 10/16/2023 09:20 AM   Modules accepted: Orders

## 2023-10-16 NOTE — Progress Notes (Signed)
 [facility] Summary Report / Loop Recorder

## 2023-10-18 NOTE — Progress Notes (Signed)
 [facility] Summary Report / Loop Recorder

## 2023-10-22 ENCOUNTER — Ambulatory Visit (INDEPENDENT_AMBULATORY_CARE_PROVIDER_SITE_OTHER)

## 2023-10-22 DIAGNOSIS — R55 Syncope and collapse: Secondary | ICD-10-CM | POA: Diagnosis not present

## 2023-10-23 LAB — CUP PACEART REMOTE DEVICE CHECK
Date Time Interrogation Session: 20250406230047
Implantable Pulse Generator Implant Date: 20210412

## 2023-10-24 ENCOUNTER — Ambulatory Visit

## 2023-10-25 ENCOUNTER — Other Ambulatory Visit: Payer: Self-pay | Admitting: Family Medicine

## 2023-10-25 ENCOUNTER — Ambulatory Visit
Admission: RE | Admit: 2023-10-25 | Discharge: 2023-10-25 | Disposition: A | Discharge status: Home / Self Care | Source: Ambulatory Visit | Attending: Family Medicine | Admitting: Family Medicine

## 2023-10-25 DIAGNOSIS — R928 Other abnormal and inconclusive findings on diagnostic imaging of breast: Secondary | ICD-10-CM

## 2023-10-25 DIAGNOSIS — R921 Mammographic calcification found on diagnostic imaging of breast: Secondary | ICD-10-CM

## 2023-10-31 ENCOUNTER — Encounter

## 2023-10-31 DIAGNOSIS — D0590 Unspecified type of carcinoma in situ of unspecified breast: Secondary | ICD-10-CM | POA: Insufficient documentation

## 2023-11-06 ENCOUNTER — Ambulatory Visit (INDEPENDENT_AMBULATORY_CARE_PROVIDER_SITE_OTHER): Payer: Medicare PPO

## 2023-11-06 NOTE — Progress Notes (Signed)
 Patient was scanned today for Custom Molded Foot orthotics patient has HX of Left mid-foot strain with pain and weakness  Patient reports having orthotics many years ago  Patient has mild BIL arch collapse with ROM WNL's  Patient was scanned today with out incident  Patient has paid $150 down on $542 cost of CFO's  Britton Cane CPed, CFo, CFm  Order placed patient will be fit upon receipt

## 2023-11-07 ENCOUNTER — Ambulatory Visit
Admission: RE | Admit: 2023-11-07 | Discharge: 2023-11-07 | Disposition: A | Discharge status: Home / Self Care | Source: Ambulatory Visit | Attending: Family Medicine | Admitting: Family Medicine

## 2023-11-07 DIAGNOSIS — D0511 Intraductal carcinoma in situ of right breast: Secondary | ICD-10-CM | POA: Diagnosis not present

## 2023-11-07 DIAGNOSIS — R921 Mammographic calcification found on diagnostic imaging of breast: Secondary | ICD-10-CM | POA: Diagnosis not present

## 2023-11-07 DIAGNOSIS — N6313 Unspecified lump in the right breast, lower outer quadrant: Secondary | ICD-10-CM | POA: Diagnosis not present

## 2023-11-07 HISTORY — PX: BREAST BIOPSY: SHX20

## 2023-11-08 LAB — SURGICAL PATHOLOGY

## 2023-11-10 ENCOUNTER — Other Ambulatory Visit: Payer: Self-pay | Admitting: Cardiology

## 2023-11-14 ENCOUNTER — Other Ambulatory Visit: Payer: Self-pay | Admitting: *Deleted

## 2023-11-14 DIAGNOSIS — D0511 Intraductal carcinoma in situ of right breast: Secondary | ICD-10-CM | POA: Insufficient documentation

## 2023-11-14 HISTORY — DX: Intraductal carcinoma in situ of right breast: D05.11

## 2023-11-16 ENCOUNTER — Ambulatory Visit: Attending: Cardiology

## 2023-11-16 DIAGNOSIS — R0600 Dyspnea, unspecified: Secondary | ICD-10-CM | POA: Diagnosis not present

## 2023-11-16 LAB — ECHOCARDIOGRAM COMPLETE
Area-P 1/2: 3.77 cm2
MV M vel: 4.3 m/s
MV Peak grad: 74 mmHg
P 1/2 time: 840 ms
Radius: 0.25 cm
S' Lateral: 4.3 cm

## 2023-11-19 ENCOUNTER — Encounter: Payer: Self-pay | Admitting: *Deleted

## 2023-11-19 ENCOUNTER — Telehealth: Payer: Self-pay | Admitting: Radiation Oncology

## 2023-11-19 DIAGNOSIS — D0511 Intraductal carcinoma in situ of right breast: Secondary | ICD-10-CM

## 2023-11-19 NOTE — Telephone Encounter (Signed)
 Received call from pt stating she is interested in receiving treatment in [place] due to distance. She states she is wanting both RadOnc and MedOnc care there. Pt was advised a message would be routed to Dr. Delane Fear, Dr. Lurena Sally, and Dr. Lee Public to advise of this change. Appt with Dr. Lurena Sally canceled at this time. Pt was advised she would received a separate call from [facility] once referral is received from Dr. Quince Bryant office to have her scheduled next available. Pt was grateful for this change since she loves the care received from [facility] but does not drive out of town too often.

## 2023-11-20 ENCOUNTER — Telehealth: Payer: Self-pay

## 2023-11-20 ENCOUNTER — Ambulatory Visit: Admitting: Radiation Oncology

## 2023-11-20 ENCOUNTER — Ambulatory Visit

## 2023-11-20 DIAGNOSIS — D0511 Intraductal carcinoma in situ of right breast: Secondary | ICD-10-CM | POA: Diagnosis not present

## 2023-11-20 NOTE — Telephone Encounter (Signed)
 Tina [name], patient's chart was reviewed for preoperative cardiac evaluation.  She was seen by you on 10/04/2023 and according to protocol, we request that you comment on cardiac risk for upcoming procedure since office visit was less than 2 months ago.    Please route your response to p cv div preop.  Thank you, [name], NP-C 11/20/2023, 4:23 PM

## 2023-11-20 NOTE — Telephone Encounter (Signed)
 Patient with diagnosis of atrial fibrillation on Eliquis  for anticoagulation.    Procedure:   URGENT Breast Cancer Surgery   Date of Surgery:  Clearance TBD    CHA2DS2-VASc Score = 3   This indicates a 3.2% annual risk of stroke. The patient's score is based upon: CHF History: 0 HTN History: 0 Diabetes History: 0 Stroke History: 0 Vascular Disease History: 1 Age Score: 1 Gender Score: 1   CrCl 75 Platelet count 346 in 2023  - due to urgent nature of surgery, I am fine with not having up to date platelet count.    Patient has not had an Afib/aflutter ablation within the last 3 months or DCCV within the last 30 days  Per office protocol, patient can hold Eliquis  for 2 days prior to procedure.   Patient will not need bridging with Lovenox (enoxaparin) around procedure.  **This guidance is not considered finalized until pre-operative APP has relayed final recommendations.**

## 2023-11-20 NOTE — Telephone Encounter (Signed)
   Pre-operative Risk Assessment    Patient Name: Tina [name]  DOB: August 03, 1950 MRN: [id number]   Date of last office visit: 10/04/23 J. Marjory Signs, NP Date of next office visit: NA    Request for Surgical Clearance    Procedure:   URGENT Breast Cancer Surgery  Date of Surgery:  Clearance TBD                                 Surgeon:  Dr. Enid Harry, MD Surgeon's Group or Practice Name:  [facility]  Phone number:  [phone] Fax number:  [phone]   Type of Clearance Requested:   - Medical  - Pharmacy:  Hold Apixaban  (Eliquis ) 5mg    Type of Anesthesia:  General    Additional requests/questions:    Darell Echevaria   11/20/2023, 3:45 PM

## 2023-11-21 ENCOUNTER — Ambulatory Visit
Admission: RE | Admit: 2023-11-21 | Discharge: 2023-11-21 | Disposition: A | Discharge status: Home / Self Care | Source: Ambulatory Visit | Attending: Radiation Oncology | Admitting: Radiation Oncology

## 2023-11-21 ENCOUNTER — Other Ambulatory Visit: Payer: Self-pay

## 2023-11-21 ENCOUNTER — Encounter: Payer: Self-pay | Admitting: Radiation Oncology

## 2023-11-21 VITALS — BP 119/62 | HR 74 | Temp 99.0°F | Resp 16 | Ht 64.0 in | Wt 144.7 lb

## 2023-11-21 DIAGNOSIS — D0511 Intraductal carcinoma in situ of right breast: Secondary | ICD-10-CM

## 2023-11-21 DIAGNOSIS — C50919 Malignant neoplasm of unspecified site of unspecified female breast: Secondary | ICD-10-CM

## 2023-11-21 DIAGNOSIS — Z170 Estrogen receptor positive status [ER+]: Secondary | ICD-10-CM | POA: Diagnosis not present

## 2023-11-21 NOTE — Telephone Encounter (Signed)
   Primary Cardiologist: Zoe Hinds, MD  Chart reviewed as part of pre-operative protocol coverage. Given past medical history and time since last visit, based on ACC/AHA guidelines, Tina [name] would be at acceptable risk for the planned procedure without further cardiovascular testing.   Patient was advised that if she develops new symptoms prior to surgery to contact our office to arrange a follow-up appointment. Se verbalized understanding.  Per office protocol, patient can hold Eliquis  for 2 days prior to procedure.   Patient will not need bridging with Lovenox (enoxaparin) around procedure.  I will route this recommendation to the requesting party via Epic fax function and remove from pre-op pool.  Please call with questions.  [name], NP-C  11/21/2023, 7:44 AM [address] [place], [place] [address] Office [phone] Fax [phone]

## 2023-11-21 NOTE — Progress Notes (Signed)
ab 

## 2023-11-21 NOTE — Progress Notes (Signed)
 Initial face to face contact with newly diagnosed breast cancer pt. Pt is here today for a Rad Onc consult with Dr. Cathren Coaster. She is scheduled for her surgery on may 21st 2025 and will have an oncology consult with Dr. Almer Jacobson at  a later appointment. Pt is accompanied today by her husband. Copy of "The Breast Cancer Treatnmet Handbook" given. Navigation contact info given. Discussed breast cancer support group meetings. Encouraged pt to call with questions or concerns.

## 2023-11-22 ENCOUNTER — Other Ambulatory Visit: Payer: Self-pay | Admitting: General Surgery

## 2023-11-22 DIAGNOSIS — E559 Vitamin D deficiency, unspecified: Secondary | ICD-10-CM | POA: Diagnosis not present

## 2023-11-22 DIAGNOSIS — M810 Age-related osteoporosis without current pathological fracture: Secondary | ICD-10-CM | POA: Diagnosis not present

## 2023-11-22 DIAGNOSIS — K219 Gastro-esophageal reflux disease without esophagitis: Secondary | ICD-10-CM | POA: Diagnosis not present

## 2023-11-22 DIAGNOSIS — D0511 Intraductal carcinoma in situ of right breast: Secondary | ICD-10-CM

## 2023-11-22 DIAGNOSIS — K7689 Other specified diseases of liver: Secondary | ICD-10-CM | POA: Diagnosis not present

## 2023-11-22 DIAGNOSIS — J45909 Unspecified asthma, uncomplicated: Secondary | ICD-10-CM | POA: Diagnosis not present

## 2023-11-22 DIAGNOSIS — I4891 Unspecified atrial fibrillation: Secondary | ICD-10-CM | POA: Diagnosis not present

## 2023-11-22 DIAGNOSIS — C50911 Malignant neoplasm of unspecified site of right female breast: Secondary | ICD-10-CM | POA: Diagnosis not present

## 2023-11-22 DIAGNOSIS — J309 Allergic rhinitis, unspecified: Secondary | ICD-10-CM | POA: Diagnosis not present

## 2023-11-22 DIAGNOSIS — G250 Essential tremor: Secondary | ICD-10-CM | POA: Diagnosis not present

## 2023-11-23 NOTE — Progress Notes (Signed)
 Radiation Oncology         [phone] ________________________________  Name: Tina [name]        MRN: [id number]  Date of Service: 11/21/2023 DOB: 16-Dec-1950  EX:[id number], Tina Campus, MD       REFERRING PHYSICIAN: Enid Harry, MD  DIAGNOSIS: Ductal carcinoma in situ of the right breast   HISTORY OF PRESENT ILLNESS: Tina [name] is a 73 y.o. female seen at the request of Dr. Delane Fear.  Earlier this year, she was found to have an abnormality on screening mammography.  Diagnostic mammogram confirmed the presence of a 9 mm group of calcifications in the right breast.  Core needle biopsy was performed on 11/07/2023, with pathology revealing ductal carcinoma in situ, solid and cribriform types with associated comedonecrosis.  Nuclear grade was 2.  She has been seen in consultation by Dr. Delane Fear, and surgical options have been discussed.  Lumpectomy is scheduled for May 21.  Consultation is requested regarding the potential role of radiation in her care.    PREVIOUS RADIATION THERAPY: No   PAST MEDICAL HISTORY:  Past Medical History:  Diagnosis Date   Abnormal mammogram of right breast 10/2016   Allergy to pollen 09/26/2018   Anxiety and depression 02/01/2011   Aortic atherosclerosis (HCC)    Arthritis    right Thumb   Atrophic vaginitis 04/28/2020   Chest pain in adult    Normal MPS 2016   Chronic anticoagulation 09/09/2018   Complication of anesthesia    prolonged sedation   Dental crowns present    Dupuytren contracture    Bilateral hands, mild   GERD (gastroesophageal reflux disease)    Gestational diabetes    history of   Grade I diastolic dysfunction 05/09/2017   on ECHO   Heart palpitations 10/13/2010   Overview:  Last Assessment & Plan:  Long hx of same - controlled with beta-blocker  ?orthostatic symptoms in AM with low normal BP - pt will monitor to see if symptoms correlate with BP reading No changes recommended today - reassured re: cards  recommendation to take extra 1/2 tab prn symptoms    Hepatitis    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   History of hiatal hernia    History of pleurisy    History of pneumonia    Hypercholesterolemia    Overview:  Last Assessment & Plan:  On statin Well controlled last check, monitor annually The current medical regimen is effective;  continue present plan and medications.    Hyperlipidemia    Irregular heartbeat    since age 59, per pt.   Itchy eyes 09/15/2019   Leg pain, right 03/20/2012   Limited joint range of motion    cervical spine - difficulty looking up for extended period   MR (mitral regurgitation) 05/09/2017   Mild, noted on ECHO   MVP (mitral valve prolapse)    Mild   Myalgia due to statin 03/07/2018   Orthostatic hypotension    Osteoporosis    Osteoporosis, post-menopausal    hx RLE fib fx 03/2012 following fall DEXA 03/13/13 @ [facility]: -2.5 at spine    PAF (paroxysmal atrial fibrillation) (HCC) 09/04/2018   Pleuritic chest pain 08/24/2014   Pleuritis 04/17/2017   PONV (postoperative nausea and vomiting)    Postural dizziness  02/01/2011   Pulmonary nodule 04/18/2017   5 mm left lower lobe nodule    Right thyroid  nodule 09/26/2018   Seasonal allergies    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   SUI (stress urinary incontinence, female)    SVT (supraventricular tachycardia) (HCC)    Syncope 08/21/2018   Tinnitus of both ears 04/26/2020   TR (tricuspid regurgitation) 05/09/2017   Mild, noted on ECHO   Urinary tract infectious disease 04/28/2020   Wears contact lenses    Wears glasses        PAST SURGICAL HISTORY: Past Surgical History:  Procedure Laterality Date   ANTERIOR CERVICAL DECOMP/DISCECTOMY FUSION  02/03/2002   C5-6   BIOPSY  08/08/2022   Procedure: BIOPSY;   Surgeon: Baldo Bonds, MD;  Location: [facility];  Service: Gastroenterology;;   BLADDER SUSPENSION N/A 04/26/2018   Procedure: TRANSVAGINAL TAPE (TVT) PROCEDURE;  Surgeon: Matt Song, MD;  Location: [facility] ;  Service: Gynecology;  Laterality: N/A;   BREAST BIOPSY Right 11/07/2023   MM RT BREAST BX W LOC DEV 1ST LESION IMAGE BX SPEC STEREO GUIDE 11/07/2023 [facility]   CATARACT EXTRACTION     CESAREAN SECTION     x 3   COLONOSCOPY  2013   several   COLONOSCOPY WITH PROPOFOL  N/A 08/08/2022   Procedure: COLONOSCOPY WITH PROPOFOL ;  Surgeon: Baldo Bonds, MD;  Location: [facility];  Service: Gastroenterology;  Laterality: N/A;   CYSTOCELE REPAIR N/A 04/26/2018   Procedure: ANTERIOR REPAIR (CYSTOCELE);  Surgeon: Matt Song, MD;  Location: [facility];  Service: Gynecology;  Laterality: N/A;   CYSTOSCOPY N/A 04/26/2018   Procedure: CYSTOSCOPY;  Surgeon: Matt Song, MD;  Location: [facility];  Service: Gynecology;  Laterality: N/A;   INGUINAL HERNIA REPAIR Right    LAPAROSCOPIC NISSEN FUNDOPLICATION  07/07/2003   LOOP RECORDER IMPLANT Left    NISSEN FUNDOPLICATION  08/14/2007   redo   RADIOACTIVE SEED GUIDED EXCISIONAL BREAST BIOPSY Right 11/08/2016   Procedure: RADIOACTIVE SEED GUIDED EXCISIONAL RIGHT BREAST BIOPSY;  Surgeon: Enid Harry, MD;  Location: [facility];  Service: General;  Laterality: Right;   TONSILLECTOMY  1977   TUBAL LIGATION     UPPER GI ENDOSCOPY     x2   WISDOM TOOTH EXTRACTION       FAMILY HISTORY:  Family History  Problem Relation Age of Onset   Alzheimer's disease Mother    Alzheimer's disease Father    Prostate cancer Father    Hyperlipidemia Father    Heart disease Father    Congestive Heart Failure Father    Heart attack Father    Heart failure Father    Stroke Paternal Grandmother    Heart attack Paternal Grandmother    Heart attack Maternal  Grandmother    Heart attack Maternal Grandfather    Heart attack Paternal Grandfather      SOCIAL HISTORY:  reports that she has never smoked. She has never used smokeless tobacco. She reports that she does not drink alcohol and does not use drugs.   ALLERGIES: Codeine, E.e.s. [erythromycin], Zocor [simvastatin], Other, and Penicillins   MEDICATIONS:  Current Outpatient Medications  Medication Sig Dispense Refill   Multiple Vitamins-Minerals (MULTIVITAMIN WOMENS 50+ ADV) TABS      acetaminophen  (TYLENOL ) 325 MG tablet Take 325 mg by mouth every 6 (six) hours as needed for pain or headache.     Coenzyme Q10 (COQ-10 PO) Take 200 mg by mouth in the  morning.     denosumab  (PROLIA ) 60 MG/ML SOSY injection Inject 60 mg into the skin every 6 (six) months.     ELIQUIS  5 MG TABS tablet TAKE 1 TABLET BY MOUTH TWICE A DAY 180 tablet 1   ezetimibe  (ZETIA ) 10 MG tablet TAKE 1/2 TABLET BY MOUTH DAILY 45 tablet 2   famotidine (PEPCID) 20 MG tablet Take 20 mg by mouth in the morning and at bedtime.     flecainide  (TAMBOCOR ) 50 MG tablet Take 1 tablet (50 mg total) by mouth 2 (two) times daily. 180 tablet 2   fluticasone  (FLONASE  ALLERGY RELIEF) 50 MCG/ACT nasal spray Place 1 spray into both nostrils daily as needed for allergies or rhinitis.     fluticasone  (FLOVENT  HFA) 44 MCG/ACT inhaler Inhale 2 puffs into the lungs 2 (two) times daily.     levocetirizine (XYZAL) 5 MG tablet Take 5 mg by mouth every evening.     meclizine  (ANTIVERT ) 25 MG tablet Take 25 mg by mouth daily as needed for dizziness.     montelukast (SINGULAIR) 10 MG tablet Take 10 mg by mouth at bedtime.     pravastatin  (PRAVACHOL ) 20 MG tablet TAKE 1 TABLET BY MOUTH EVERY DAY IN THE EVENING 90 tablet 2   propranolol (INDERAL) 10 MG tablet Take 10 mg by mouth 2 (two) times daily.     No current facility-administered medications for this encounter.     REVIEW OF SYSTEMS: On review of systems, the patient reports that she is doing  well overall.  She denies any chest pain, shortness of breath, cough, fevers, chills, night sweats, unintended weight changes.  She denies any bowel or bladder disturbances, and denies abdominal pain, nausea or vomiting.  She denies any new musculoskeletal or joint aches or pains.  She reports no pain in either breast.  She denies nipple bleeding or discharge.  A complete review of systems is obtained and is otherwise negative.     PHYSICAL EXAM:  Wt Readings from Last 3 Encounters:  11/21/23 144 lb 11.2 oz (65.6 kg)  10/04/23 146 lb 12.8 oz (66.6 kg)  08/09/23 145 lb 9.6 oz (66 kg)   Temp Readings from Last 3 Encounters:  11/21/23 99 F (37.2 C) (Oral)  08/08/22 98.2 F (36.8 C) (Temporal)  11/17/19 (!) 97.1 F (36.2 C)   BP Readings from Last 3 Encounters:  11/21/23 119/62  10/04/23 104/72  08/09/23 110/68   Pulse Readings from Last 3 Encounters:  11/21/23 74  10/04/23 83  08/09/23 80   Pain Assessment Pain Score: 0-No pain/10  In general this is a well appearing female in no acute distress.  She's alert and oriented x4 and appropriate throughout the examination. Cardiopulmonary assessment is negative for acute distress and she exhibits normal effort.  No cervical, supraclavicular, or axillary lymphadenopathy is appreciated.    ECOG = 0  0 - Asymptomatic (Fully active, able to carry on all predisease activities without restriction)  1 - Symptomatic but completely ambulatory (Restricted in physically strenuous activity but ambulatory and able to carry out work of a light or sedentary nature. For example, light housework, office work)  2 - Symptomatic, <50% in bed during the day (Ambulatory and capable of all self care but unable to carry out any work activities. Up and about more than 50% of waking hours)  3 - Symptomatic, >50% in bed, but not bedbound (Capable of only limited self-care, confined to bed or chair 50% or more of waking hours)  4 - Bedbound (Completely  disabled. Cannot carry on any self-care. Totally confined to bed or chair)  5 - Death   Aurea Blossom MM, Creech RH, Tormey DC, et al. [phone]). "Toxicity and response criteria of the [facility]". Am. Hillard Lowes. Oncol. 5 (6): 649-55    LABORATORY DATA:  Lab Results  Component Value Date   WBC 11.0 (H) 05/05/2022   HGB 16.2 (H) 05/05/2022   HCT 47.7 (H) 05/05/2022   MCV 90 05/05/2022   PLT 346 05/05/2022   Lab Results  Component Value Date   NA 140 05/05/2022   K 3.9 05/05/2022   CL 102 05/05/2022   CO2 24 05/05/2022   Lab Results  Component Value Date   ALT 38 (H) 05/05/2022   AST 22 05/05/2022   ALKPHOS 78 05/05/2022   BILITOT 0.7 05/05/2022      RADIOGRAPHY: ECHOCARDIOGRAM COMPLETE Result Date: 11/16/2023    ECHOCARDIOGRAM REPORT   Patient Name:   ASHRITA MINGE Date of Exam: 11/16/2023 Medical Rec #:  [id number]      Height:       64.0 in Accession #:    [id number]     Weight:       146.8 lb Date of Birth:  23-Jul-1950      BSA:          1.715 m Patient Age:    73 years       BP:           104/72 mmHg Patient Gender: F              HR:           66 bpm. Exam Location:  [place] Procedure: 2D Echo, Cardiac Doppler, Color Doppler and Strain Analysis (Both            Spectral and Color Flow Doppler were utilized during procedure). Indications:    Dyspnea, unspecified type [R06.00 (ICD-10-CM)]  History:        Patient has prior history of Echocardiogram examinations, most                 recent 05/09/2017. COPD, Arrythmias:Atrial Fibrillation,                 Tachycardia and RBBB, Signs/Symptoms:Dyspnea; Risk                 Factors:Dyslipidemia.  Sonographer:    Erminia Hazel RDCS Referring Phys: [phone] JENNIFER C WOODY IMPRESSIONS  1. Large cyctic lesion noted most likely in the liver. Abdominal CT or US  of the liver should be considered. Left ventricular ejection fraction, by estimation, is 50 to 55%. The left ventricle has low normal function. The left ventricle has no  regional wall motion abnormalities. Left ventricular diastolic parameters are consistent with Grade I diastolic dysfunction (impaired relaxation). The average left ventricular global longitudinal strain is -15.2 %. The global longitudinal strain is abnormal.  2. Right ventricular systolic function is normal. The right ventricular size is normal. There is normal pulmonary artery systolic pressure.  3. Left atrial size was moderately dilated.  4. The mitral valve is normal in structure. Mild mitral valve regurgitation. No evidence of mitral stenosis.  5. The aortic valve is normal in structure. Aortic valve regurgitation is mild. No aortic stenosis is present.  6. The inferior vena cava is normal in size with greater than 50% respiratory variability, suggesting right atrial pressure of 3 mmHg. FINDINGS  Left Ventricle: Large cyctic lesion noted most  likely in the liver. Abdominal CT or US  of the liver should be considered. Left ventricular ejection fraction, by estimation, is 50 to 55%. The left ventricle has low normal function. The left ventricle has  no regional wall motion abnormalities. The average left ventricular global longitudinal strain is -15.2 %. Strain was performed and the global longitudinal strain is abnormal. The left ventricular internal cavity size was normal in size. There is no left ventricular hypertrophy. Left ventricular diastolic parameters are consistent with Grade I diastolic dysfunction (impaired relaxation). Right Ventricle: The right ventricular size is normal. No increase in right ventricular wall thickness. Right ventricular systolic function is normal. There is normal pulmonary artery systolic pressure. The tricuspid regurgitant velocity is 2.51 m/s, and  with an assumed right atrial pressure of 3 mmHg, the estimated right ventricular systolic pressure is 28.2 mmHg. Left Atrium: Left atrial size was moderately dilated. Right Atrium: Right atrial size was normal in size. Pericardium:  There is no evidence of pericardial effusion. Mitral Valve: The mitral valve is normal in structure. Mild mitral valve regurgitation. No evidence of mitral valve stenosis. Tricuspid Valve: The tricuspid valve is normal in structure. Tricuspid valve regurgitation is not demonstrated. No evidence of tricuspid stenosis. Aortic Valve: The aortic valve is normal in structure. Aortic valve regurgitation is mild. Aortic regurgitation PHT measures 840 msec. No aortic stenosis is present. Pulmonic Valve: The pulmonic valve was normal in structure. Pulmonic valve regurgitation is not visualized. No evidence of pulmonic stenosis. Aorta: The aortic root is normal in size and structure. Venous: The inferior vena cava is normal in size with greater than 50% respiratory variability, suggesting right atrial pressure of 3 mmHg. IAS/Shunts: No atrial level shunt detected by color flow Doppler.  LEFT VENTRICLE PLAX 2D LVIDd:         5.60 cm   Diastology LVIDs:         4.30 cm   LV e' medial:    8.05 cm/s LV PW:         0.80 cm   LV E/e' medial:  7.3 LV IVS:        0.80 cm   LV e' lateral:   9.03 cm/s LVOT diam:     2.00 cm   LV E/e' lateral: 6.5 LV SV:         51 LV SV Index:   30        2D Longitudinal Strain LVOT Area:     3.14 cm  2D Strain GLS Avg:     -15.2 %  RIGHT VENTRICLE             IVC RV Basal diam:  2.60 cm     IVC diam: 0.90 cm RV Mid diam:    2.20 cm RV S prime:     11.30 cm/s TAPSE (M-mode): 2.4 cm LEFT ATRIUM             Index        RIGHT ATRIUM           Index LA diam:        3.30 cm 1.92 cm/m   RA Area:     12.70 cm LA Vol (A2C):   72.2 ml 42.09 ml/m  RA Volume:   28.80 ml  16.79 ml/m LA Vol (A4C):   57.9 ml 33.75 ml/m LA Biplane Vol: 68.2 ml 39.76 ml/m  AORTIC VALVE LVOT Vmax:   64.35 cm/s LVOT Vmean:  44.150 cm/s LVOT VTI:    0.162  m AI PHT:      840 msec  AORTA Ao Root diam: 3.20 cm Ao Asc diam:  3.30 cm MITRAL VALVE                  TRICUSPID VALVE MV Area (PHT): 3.77 cm       TR Peak grad:   25.2 mmHg  MV Decel Time: 201 msec       TR Vmax:        251.00 cm/s MR Peak grad:    74.0 mmHg MR Vmax:         430.00 cm/s  SHUNTS MR PISA:         0.39 cm     Systemic VTI:  0.16 m MR PISA Eff ROA: 4 mm        Systemic Diam: 2.00 cm MR PISA Radius:  0.25 cm MV E velocity: 58.80 cm/s MV A velocity: 82.95 cm/s MV E/A ratio:  0.71 Ralene Burger MD Electronically signed by Ralene Burger MD Signature Date/Time: 11/16/2023/3:05:53 PM    Final    MM RT BREAST BX W LOC DEV 1ST LESION IMAGE BX SPEC STEREO GUIDE Addendum Date: 11/12/2023 ADDENDUM REPORT: 11/12/2023 08:18 ADDENDUM: Pathology revealed DUCTAL CARCINOMA IN SITU, SOLID AND CRIBRIFORM TYPE WITH COMEDONECROSIS AND ASSOCIATED CALCIFICATIONS, NUCLEAR GRADE 2 OF 3, NECROSIS: PRESENT, CALCIFICATIONS: PRESENT of the RIGHT breast, outer central, (x clip). This was found to be concordant by Dr. Luann Rundle Mir. Pathology results were discussed with the patient by telephone. The patient reported doing well after the biopsy with tenderness and bruising at the site. Post biopsy instructions and care were reviewed and questions were answered. The patient was encouraged to call [facility] for any additional concerns. My direct phone number was provided. Surgical consultation has been arranged with Dr. Enid Harry, per patient request, at [facility] on Nov 20, 2023. Pathology results reported by Kraig Peru, RN on 11/12/2023. Electronically Signed   By: Elester Grim M.D.   On: 11/12/2023 08:18   Result Date: 11/12/2023 CLINICAL DATA:  73 year old woman with indeterminate right breast calcifications presents for stereotactic guided core needle biopsy. EXAM: RIGHT BREAST STEREOTACTIC CORE NEEDLE BIOPSY COMPARISON:  Previous exam(s). FINDINGS: The patient and I discussed the procedure of stereotactic-guided biopsy including benefits and alternatives. We discussed the high likelihood of a successful procedure. We discussed the risks of  the procedure including infection, bleeding, tissue injury, clip migration, and inadequate sampling. Informed written consent was given. The usual time out protocol was performed immediately prior to the procedure. Using sterile technique and 1% Lidocaine  as local anesthetic, under stereotactic guidance, a 9 gauge vacuum assisted device was used to perform core needle biopsy of calcifications in the outer right breast using a lateral approach. Specimen radiograph was performed showing calcifications. Lesion quadrant: Lower outer quadrant At the conclusion of the procedure, X shaped tissue marker clip was deployed into the biopsy cavity. Follow-up 2-view mammogram was performed and dictated separately. IMPRESSION: Stereotactic-guided biopsy of 0.9 cm group of right outer breast calcifications. No apparent complications. Electronically Signed: By: Elester Grim M.D. On: 11/07/2023 08:59   MM CLIP PLACEMENT RIGHT Result Date: 11/07/2023 CLINICAL DATA:  Status post stereotactic guided biopsy of right breast calcifications. EXAM: 3D DIAGNOSTIC RIGHT MAMMOGRAM POST STEREOTACTIC BIOPSY COMPARISON:  Previous exam(s). ACR Breast Density Category b: There are scattered areas of fibroglandular density. FINDINGS: 3D Mammographic images were obtained following stereotactic guided biopsy of right breast calcifications. The biopsy  marking clip is displaced approximately 4 cm medial to the biopsy cavity. IMPRESSION: X shaped biopsy marking clip is displaced approximately 4 cm medial to the biopsy cavity in the outer right breast. Final Assessment: Post Procedure Mammograms for Marker Placement Electronically Signed   By: Elester Grim M.D.   On: 11/07/2023 09:02   MM Digital Diagnostic Unilat R Result Date: 10/25/2023 CLINICAL DATA:  73 year old female presents for further evaluation of RIGHT breast calcifications identified on screening mammogram. EXAM: DIGITAL DIAGNOSTIC UNILATERAL RIGHT MAMMOGRAM TECHNIQUE: Right digital  diagnostic mammography was performed. COMPARISON:  Previous exam(s). ACR Breast Density Category b: There are scattered areas of fibroglandular density. FINDINGS: Full field and magnification views of the RIGHT breast demonstrate a 0.9 cm group of calcifications in a linear orientation, located in the posterior slightly OUTER central RIGHT breast. IMPRESSION: 0.9 cm group of indeterminate posterior slightly OUTER central RIGHT breast calcifications. Tissue sampling is recommended. RECOMMENDATION: Stereotactic guided RIGHT breast biopsy, which will be scheduled. I have discussed the findings and recommendations with the patient. If applicable, a reminder letter will be sent to the patient regarding the next appointment. BI-RADS CATEGORY  4: Suspicious. Electronically Signed   By: Sundra Engel M.D.   On: 10/25/2023 14:05       IMPRESSION/PLAN: 1.  The patient is a 73 year old female with ductal carcinoma in situ of the right breast, solid and cribriform type with associated comedonecrosis.  Lumpectomy is scheduled for May 21.  I spoke at length with the patient and her spouse regarding the natural history of ductal carcinoma in situ, as well as the role of radiation and its management.  I explained the logistics of adjuvant breast radiation, as well as its potential side effects.  I explained that these may include, but are not limited to, fatigue, skin irritation, and long-term risk of injury to lung and other normal tissues.  She and her husband expressed understanding.  I will await the results of her final surgical pathology, and schedule further follow-up as indicated.  I encouraged the patient to contact me at anytime with any questions or concerns she may have.   In a visit lasting 60 minutes, greater than 50% of the time was spent face to face discussing the patient's condition, in preparation for the discussion, and coordinating the patient's care.    Elspeth Blucher A. Cathren Coaster, MD   **Disclaimer: This  note was dictated with voice recognition software. Similar sounding words can inadvertently be transcribed and this note may contain transcription errors which may not have been corrected upon publication of note.**

## 2023-11-26 ENCOUNTER — Ambulatory Visit (INDEPENDENT_AMBULATORY_CARE_PROVIDER_SITE_OTHER)

## 2023-11-26 DIAGNOSIS — R55 Syncope and collapse: Secondary | ICD-10-CM

## 2023-11-26 LAB — CUP PACEART REMOTE DEVICE CHECK
Date Time Interrogation Session: 20250511233155
Implantable Pulse Generator Implant Date: 20210412

## 2023-11-28 ENCOUNTER — Encounter: Payer: Self-pay | Admitting: Oncology

## 2023-11-28 ENCOUNTER — Inpatient Hospital Stay

## 2023-11-28 ENCOUNTER — Inpatient Hospital Stay: Attending: Oncology | Admitting: Oncology

## 2023-11-28 ENCOUNTER — Other Ambulatory Visit: Payer: Self-pay | Admitting: Oncology

## 2023-11-28 ENCOUNTER — Ambulatory Visit: Payer: Self-pay | Admitting: Cardiology

## 2023-11-28 VITALS — BP 120/73 | HR 70 | Temp 97.6°F | Resp 16 | Ht 64.0 in | Wt 144.7 lb

## 2023-11-28 DIAGNOSIS — Z170 Estrogen receptor positive status [ER+]: Secondary | ICD-10-CM | POA: Insufficient documentation

## 2023-11-28 DIAGNOSIS — D0511 Intraductal carcinoma in situ of right breast: Secondary | ICD-10-CM

## 2023-11-28 DIAGNOSIS — Z1721 Progesterone receptor positive status: Secondary | ICD-10-CM | POA: Insufficient documentation

## 2023-11-28 DIAGNOSIS — K7689 Other specified diseases of liver: Secondary | ICD-10-CM | POA: Insufficient documentation

## 2023-11-28 DIAGNOSIS — M810 Age-related osteoporosis without current pathological fracture: Secondary | ICD-10-CM | POA: Insufficient documentation

## 2023-11-28 DIAGNOSIS — Z8042 Family history of malignant neoplasm of prostate: Secondary | ICD-10-CM | POA: Insufficient documentation

## 2023-11-28 LAB — CMP (CANCER CENTER ONLY)
ALT: 13 U/L (ref 0–44)
AST: 21 U/L (ref 15–41)
Albumin: 4.1 g/dL (ref 3.5–5.0)
Alkaline Phosphatase: 83 U/L (ref 38–126)
Anion gap: 12 (ref 5–15)
BUN: 19 mg/dL (ref 8–23)
CO2: 27 mmol/L (ref 22–32)
Calcium: 9.9 mg/dL (ref 8.9–10.3)
Chloride: 104 mmol/L (ref 98–111)
Creatinine: 0.86 mg/dL (ref 0.44–1.00)
GFR, Estimated: >60 mL/min (ref >60)
Glucose, Bld: 89 mg/dL (ref 70–99)
Potassium: 4 mmol/L (ref 3.5–5.1)
Sodium: 143 mmol/L (ref 135–145)
Total Bilirubin: 0.4 mg/dL (ref 0.0–1.2)
Total Protein: 7.3 g/dL (ref 6.5–8.1)

## 2023-11-28 LAB — CBC WITH DIFFERENTIAL (CANCER CENTER ONLY)
Abs Immature Granulocytes: 0.02 10*3/uL (ref 0.00–0.07)
Basophils Absolute: 0.1 10*3/uL (ref 0.0–0.1)
Basophils Relative: 1 %
Eosinophils Absolute: 0.2 10*3/uL (ref 0.0–0.5)
Eosinophils Relative: 4 %
HCT: 39.9 % (ref 36.0–46.0)
Hemoglobin: 13.6 g/dL (ref 12.0–15.0)
Immature Granulocytes: 0 %
Lymphocytes Relative: 31 %
Lymphs Abs: 1.8 10*3/uL (ref 0.7–4.0)
MCH: 30.4 pg (ref 26.0–34.0)
MCHC: 34.1 g/dL (ref 30.0–36.0)
MCV: 89.1 fL (ref 80.0–100.0)
Monocytes Absolute: 0.8 10*3/uL (ref 0.1–1.0)
Monocytes Relative: 13 %
Neutro Abs: 3 10*3/uL (ref 1.7–7.7)
Neutrophils Relative %: 51 %
Platelet Count: 230 10*3/uL (ref 150–400)
RBC: 4.48 MIL/uL (ref 3.87–5.11)
RDW: 12.3 % (ref 11.5–15.5)
WBC Count: 5.9 10*3/uL (ref 4.0–10.5)
nRBC: 0 % (ref 0.0–0.2)

## 2023-11-28 NOTE — Progress Notes (Addendum)
 ADDENDUM: I have now reviewed her latest bone density scan from 08/20/23.  Her spine is normal with a T score of -1.0 and 8.6% improvement from the prior study of 08/16/21.  However her femur has a T score of -2.3 and -1.7, which is a 4.9% worsening from the prior study. So she was advised to continue the Prolia .   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 I reassured her of her great prognosis which is largely due to her compliance with yearly screening. I doubt she will need radiotherapy if we have good margins but we will decide that when she returns. However, we did discuss chemoprevention. I would prefer raloxifene due to fewer toxicities and the additional benefit of treating her osteoporosis. I discussed the studies that led to the approval for this indication but I also let her know it is optional and her prognosis is great even without it. She can think it over and we can decide when she returns. If she takes that, there is not likely additional benefit of adding radiation.   Osteoporosis She is current on her bone density scans and I will review those when available. She is on Prolia  injections every 6 months but did have some worsening. Dr. Reather [name] found that she had a vitamin D  deficiency and has her on a supplement.    Hepatic Cysts She has 1 especially large cyst measuring over 12 cm of the right lobe of the liver but this appears completely benign as a simple cyst. She has additional small cysts in the liver and right kidney which also appear benign. I reassured her about this but she was concerned about the report of hepatomegaly so we will likely follow this from time to time. She does have a history of hepatitis A at age 74.    Plan: Tina [name] is here for consultation prior to lumpectomy which will be performed on May, 21st. She has already seen Tina [name] of radiation oncology and recommendations are pending based on final pathology and other factors. We reviewed her medical history and family history in detail. Tina [name] has multiple medical comorbidities and a paternal family history of melanoma. She recently went to dermatology and had a full body scan due to her family hx of melanoma. She sees dermatology regularly and had her last colonoscopy in January, 2024. Patient tends to stay up to date on all screening tests and prevention options that are needed. She had a bone density scan done earlier this year, I will request the report for this from [facility]. She does receive Prolia  injections every 6 months for her osteoporosis and will have her next one on 11/29/2023. After reviewing her pathology together I explained that her tumor is highly dependent on estrogen to grow so I advise against taking anything orally with estrogen. I also explained that this is a very early tumor due to this being a stage 0, grade 2 DCIS. I recommended chemoprevention and explained what that is to her and the benefit. I informed her of the options of tamoxifen and the potential side effects such as blood clots and uterine cancer, there is also the option of raloxifene, which would also help her osteoporosis while lowering her  risk of breast cancer. I would not stop the Prolia  but we could add the raloxifene to it. After her lumpectomy she will discuss the final pathology with me. Her CBC and CMP today were normal. I will see her back in 1 month for reevaluation. I discussed the assessment and treatment plan with the patient and her husband.  The patient was provided an opportunity to ask questions and all were answered.  The patient agreed with the plan and demonstrated an understanding of the instructions.  The patient was advised to call  back if the symptoms worsen or if the condition fails to improve as anticipated.  Thank you for the opportunity to care for your patients.   I provided 63 minutes of face-to-face time during this this encounter and > 50% was spent counseling as documented under my assessment and plan.   Tina [name] [facility] DE[facility] OF [facility] [address] [place] [address] Dept: [phone] Dept Fax: [phone]   I, Tina [name] for Tina [name]  I have reviewed this report as typed by the medical [name], and it is complete and accurate.  Tina [name]   5/22/20256:45 AM  CHIEF COMPLAINT:  CC:  Ductal carcinoma in situ  Current Treatment: Surgical Lumpectomy   HISTORY OF PRESENT ILLNESS:  Tina [name] is a 73 y.o. female with a history of yearly mammograms who is referred in consultation by Dr. Raylene [name] for assessment and management of newly diagnosed DCIS. Patient informed me that years ago she had a cyst removed from the right breast and this was found to be benign. She states that this current finding was found on routine mammogram. She then had a unilateral diagnostic mammogram on 10/25/2023, which revealed a 0.9 cm group of indeterminate posterior slightly outer central right  breast calcifications and a biopsy was recommended for further evaluation. Pathology revealed a noninvasive ductal carcinoma in situ that is solid and cribriform type with comedonecrosis and associated calcifications. This was found to be a grade 2, highly ER positive at 100%, and mildly PR positive at 30%, there was necrosis and calcifications present. She has already had a consultation with our radiation oncologist, Tina [name], last week and he will make a final recommendation after her lumpectomy pathology. She had an echocardiogram done to be cleared for her lumpectomy and she was found  to have an ejection fraction of 55-50% with normal ventricle function and mild mitral valve regurgitation. However there was an incidental finding of a large cystic lesion in the liver. Her cardiologist recommended a CT or ultrasound of the liver for further evaluation. Ultrasound of the liver done on 11/23/2023 revealed hepatic cysts measuring 0.9 x 0.9 x 0.7 cm and 12.7 x 12.2 x 10.8 cm of the right lobe of the liver and simple cysts measuring 1.1 x 1.0 x 0.9cm within the mid pole of the right kidney. We will continue to monitor this either via ultrasound or CT scan since she did have some hepatomegaly but no other concerning findings. She did have a history of hepatitis A at age 72. Tina [name] informed me that she has had genetic testing done by [facility] and was found to have no genetic mutations. This tested for a limited panel including BRCA 1 & 2. Although she has no family history of breast cancer, both her father and paternal aunt have had melanoma and  the aunt expired from this. Her father has also had prostate cancer. I recommend more detailed genetic testing for clarification and she has an appointment with the genetic counselor on July, 11th.    I have reviewed her chart and materials related to her cancer extensively and collaborated history with the patient. Summary of oncologic history is as follows:  Oncology History  Ductal carcinoma in situ (DCIS) of right breast  11/14/2023 Initial Diagnosis   Ductal carcinoma in situ (DCIS) of right breast   11/28/2023 Cancer Staging   Staging form: Breast, AJCC 8th Edition - Clinical stage from 11/28/2023: Stage 0 (cTis (DCIS), cN0, cM0, G2, ER+, PR+, HER2: Not Assessed) - Signed by Tina [name] on 11/28/2023 Histopathologic type: Intraductal carcinoma, noninfiltrating, NOS Stage prefix: Initial diagnosis Method of lymph node assessment: Clinical Nuclear grade: G2 Histologic grading system: 3 grade system Laterality: Right Tumor size (mm):  9 Lymph-vascular invasion (LVI): Presence of LVI unknown/indeterminate Diagnostic confirmation: Positive histology Specimen type: Core Needle Biopsy Staged by: Managing physician Stage used in treatment planning: Yes National guidelines used in treatment planning: Yes Type of national guideline used in treatment planning: NCCN    INTERVAL HISTORY:  Tina [name] has had a breast biopsy with findings of DCIS and is here for consultation prior to lumpectomy which will be performed on May, 21st. She has already seen Tina [name] of radiation oncology and recommendations are pending based on final pathology and other factors. We reviewed her medical history and family history in detail. Tina [name] has multiple medical comorbidities and a paternal family history of melanoma. She has recently went to dermatology and had a full body scan due to her family hx of melanoma. She sees dermatology regularly and had her last colonoscopy in January, 2024. Patient tends to stay up to date on all screening tests and prevention options that are needed. She had a bone density scan done on 08/20/23, I will request the report for this from [facility]. She does receive Prolia  injections every 6 months for her osteoporosis and will have her next one on 11/29/2023. After reviewing her pathology together I explained that her tumor is highly dependent on estrogen to grow so I advise against taking anything orally with estrogen. I also explained that this is very early tumor due to this being a stage 0, grade 2 DCIS. I recommended chemoprevention and explained what that is to her and the benefit. I informed her of the options of tamoxifen and the potential side effects such as blood clots and uterine cancer.  There is also the option of raloxifene, which would also help her osteoporosis while lowering her risk of breast cancer. I would not stop the Prolia  but we could add the raloxifene to it. After her lumpectomy she will discuss  the final pathology with me. Her CBC and CMP today were normal. I will see her back in 1 month for reevaluation.   She denies fever, chills, night sweats, or other signs of infection. She denies cardiorespiratory and gastrointestinal issues. She denies pain. Her appetite is good. Her weight has been stable.   HISTORY:   Past Medical History:  Diagnosis Date   Abnormal mammogram of right breast 10/2016   Allergy to pollen 09/26/2018   Anxiety and depression 02/01/2011   Aortic atherosclerosis (HCC)    Arthritis    right Thumb   Atrophic vaginitis 04/28/2020   Cancer (HCC)    Chest pain in adult    Normal MPS 2016  Chronic anticoagulation 09/09/2018   Complication of anesthesia    prolonged sedation   Dental crowns present    Dupuytren contracture    Bilateral hands, mild   GERD (gastroesophageal reflux disease)    Grade I diastolic dysfunction 05/09/2017   on ECHO   Heart murmur    Heart palpitations 10/13/2010   Overview:  Last Assessment & Plan:  Long hx of same - controlled with beta-blocker  ?orthostatic symptoms in AM with low normal BP - pt will monitor to see if symptoms correlate with BP reading No changes recommended today - reassured re: cards recommendation to take extra 1/2 tab prn symptoms    High risk medication use 09/09/2018   History of blood transfusion 1982   History of cardiomegaly 04/14/2017   Noted on CXR   History of hepatitis as a child    hepatitis A at age 37; "infectious", per pt.   History of hiatal hernia    History of pleurisy    History of pneumonia    Hypercholesterolemia    Overview:  Last Assessment & Plan:  On statin Well controlled last check, monitor annually The current medical regimen is effective;  continue present plan and medications.    Hyperlipidemia    Irregular heartbeat    since age 63, per pt.   Itchy eyes 09/15/2019   Leg pain, right 03/20/2012   Limited joint range of motion    cervical spine - difficulty looking up for  extended period   MR (mitral regurgitation) 05/09/2017   Mild, noted on ECHO   MVP (mitral valve prolapse)    Mild   Myalgia due to statin 03/07/2018   Orthostatic hypotension    Osteoporosis    Osteoporosis, post-menopausal    hx RLE fib fx 03/2012 following fall DEXA 03/13/13 @ [facility]: -2.5 at spine    PAF (paroxysmal atrial fibrillation) (HCC) 09/04/2018   Pleuritic chest pain 08/24/2014   Pleuritis 04/17/2017   PONV (postoperative nausea and vomiting)    Postural dizziness 02/01/2011   Pulmonary nodule 04/18/2017   5 mm left lower lobe nodule    Right thyroid  nodule 09/26/2018   Seasonal allergies    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   SUI (stress urinary incontinence, female)    SVT (supraventricular tachycardia) (HCC)    Syncope 08/21/2018   Tinnitus of both ears 04/26/2020   TR (tricuspid regurgitation) 05/09/2017   Mild, noted on ECHO   Urinary tract infectious disease 04/28/2020   Wears contact lenses    Wears glasses     Past Surgical History:  Procedure Laterality Date   ANTERIOR CERVICAL DECOMP/DISCECTOMY FUSION  02/03/2002   C5-6   BIOPSY  08/08/2022   Procedure: BIOPSY;  Surgeon: Baldo Bonds, [name];  Location: [facility];  Service: Gastroenterology;;   BLADDER SUSPENSION N/A 04/26/2018   Procedure: TRANSVAGINAL TAPE (TVT) PROCEDURE;  Surgeon: Matt Song, [name];  Location: [facility];  Service: Gynecology;  Laterality: N/A;   BREAST BIOPSY Right 11/07/2023   MM RT BREAST BX W LOC DEV 1ST LESION IMAGE BX SPEC STEREO GUIDE 11/07/2023 [facility]   BREAST BIOPSY  12/04/2023   MM RT RADIOACTIVE SEED LOC MAMMO GUIDE 12/04/2023 [facility]AMMO[facility]   BREAST LUMPECTOMY WITH RADIOACTIVE SEED LOCALIZATION Right 12/05/2023   Procedure: BREAST LUMPECTOMY WITH RADIOACTIVE SEED LOCALIZATION;  Surgeon: Enid Harry, [name];  Location: [facility] OR;  Service: General;  Laterality: Right;  RIGHT BREAST SEED GUIDED LUMPECTOMY   CATARACT  EXTRACTION  CESAREAN SECTION     x 3   COLONOSCOPY  2013   several   COLONOSCOPY WITH PROPOFOL  N/A 08/08/2022   Procedure: COLONOSCOPY WITH PROPOFOL ;  Surgeon: Baldo Bonds, [name];  Location: [facility];  Service: Gastroenterology;  Laterality: N/A;   CYSTOCELE REPAIR N/A 04/26/2018   Procedure: ANTERIOR REPAIR (CYSTOCELE);  Surgeon: Matt Song, [name];  Location: [facility];  Service: Gynecology;  Laterality: N/A;   CYSTOSCOPY N/A 04/26/2018   Procedure: CYSTOSCOPY;  Surgeon: Matt Song, [name];  Location: [facility];  Service: Gynecology;  Laterality: N/A;   INGUINAL HERNIA REPAIR Right    LAPAROSCOPIC NISSEN FUNDOPLICATION  07/07/2003   LOOP RECORDER IMPLANT Left    NISSEN FUNDOPLICATION  08/14/2007   [facility]   RADIOACTIVE SEED GUIDED EXCISIONAL BREAST BIOPSY Right 11/08/2016   Procedure: RADIOACTIVE SEED GUIDED EXCISIONAL RIGHT BREAST BIOPSY;  Surgeon: Enid Harry, [name];  Location: [facility];  Service: General;  Laterality: Right;   TONSILLECTOMY  1977   TUBAL LIGATION     UPPER GI ENDOSCOPY     x2   WISDOM TOOTH EXTRACTION      Family History  Problem Relation Age of Onset   Alzheimer's disease Mother    Alzheimer's disease Father    Prostate cancer Father    Hyperlipidemia Father    Heart disease Father    Congestive Heart Failure Father    Heart attack Father    Melanoma Father    Heart attack Maternal Grandmother    Heart attack Maternal Grandfather    Stroke Paternal Grandmother    Heart attack Paternal Grandmother    Heart attack Paternal Grandfather    Melanoma Paternal Aunt     Social History:  reports that she has never smoked. She has never used smokeless tobacco. She reports that she does not drink alcohol and does not use drugs.The patient is accompanied by husband today.  Allergies:  Allergies  Allergen Reactions   Codeine Nausea And Vomiting, Nausea Only and Other (See Comments)    Other  Reaction(s): GI Intolerance,  Other Reaction(s): GI Intolerance, Other (See Comments)    nausea   E.E.S. [Erythromycin] Other (See Comments)    TACHYCARDIA    Zocor [Simvastatin] Other (See Comments)    Leg pain   Other Other (See Comments)    Difficulty waking from anesthesia   Penicillins Rash, Dermatitis and Hives    rash    Current Medications: Current Outpatient Medications  Medication Sig Dispense Refill   acetaminophen  (TYLENOL ) 325 MG tablet Take 325 mg by mouth every 6 (six) hours as needed for pain or headache.     Ca Phosphate-Cholecalciferol (CALCIUM /VITAMIN D3 GUMMIES PO) Take 2 each by mouth daily. 500 mg / 1000 units per gummy     Coenzyme Q10 (COQ-10) 200 MG CAPS Take 200 mg by mouth in the morning.     denosumab  (PROLIA ) 60 MG/ML SOSY injection Inject 60 mg into the skin every 6 (six) months.     ELIQUIS  5 MG TABS tablet TAKE 1 TABLET BY MOUTH TWICE A DAY 180 tablet 1   ezetimibe  (ZETIA ) 10 MG tablet TAKE 1/2 TABLET BY MOUTH DAILY 45 tablet 2   famotidine (PEPCID) 20 MG tablet Take 20 mg by mouth daily.     FIBER ADULT GUMMIES PO Take 1 each by mouth daily.     flecainide  (TAMBOCOR ) 50 MG tablet Take 1 tablet (50 mg total) by mouth 2 (two) times daily. 180 tablet 2  fluticasone  (FLONASE  ALLERGY RELIEF) 50 MCG/ACT nasal spray Place 1 spray into both nostrils daily.     fluticasone  (FLOVENT  HFA) 44 MCG/ACT inhaler Inhale 1 puff into the lungs 2 (two) times daily.     levocetirizine (XYZAL) 5 MG tablet Take 5 mg by mouth every evening.     meclizine  (ANTIVERT ) 25 MG tablet Take 25 mg by mouth daily as needed for dizziness.     montelukast (SINGULAIR) 10 MG tablet Take 10 mg by mouth at bedtime.     Multiple Vitamins-Minerals (AIRBORNE GUMMIES PO) Take 1 each by mouth daily.     Multiple Vitamins-Minerals (MULTIVITAMIN WOMENS 50+ ADV) TABS Take 1 tablet by mouth daily.     Omega-3 Fatty Acids (MEGARED ADVANCED OMEGA-3) 800 MG CAPS Take 800 mg by mouth daily.      pravastatin  (PRAVACHOL ) 20 MG tablet TAKE 1 TABLET BY MOUTH EVERY DAY IN THE EVENING 90 tablet 2   propranolol (INDERAL) 10 MG tablet Take 10 mg by mouth 2 (two) times daily.     No current facility-administered medications for this visit.    REVIEW OF SYSTEMS:  Review of Systems  Constitutional:  Negative for appetite change, chills, fever and unexpected weight change.  HENT:  Negative.  Negative for lump/mass, mouth sores and sore throat.   Eyes: Negative.   Respiratory: Negative.  Negative for chest tightness, cough, hemoptysis, shortness of breath and wheezing.   Cardiovascular: Negative.  Negative for chest pain, leg swelling and palpitations.  Gastrointestinal: Negative.  Negative for abdominal distention, abdominal pain, blood in stool, constipation, diarrhea, nausea and vomiting.  Endocrine: Negative.   Genitourinary: Negative.  Negative for difficulty urinating, dysuria, frequency and hematuria.   Musculoskeletal:  Negative for arthralgias, back pain, flank pain and gait problem.  Skin: Negative.   Neurological:  Negative for dizziness, extremity weakness, gait problem, headaches, light-headedness, numbness, seizures and speech difficulty.  Hematological: Negative.  Negative for adenopathy. Does not bruise/bleed easily.  Psychiatric/Behavioral: Negative.  Negative for depression and sleep disturbance. The patient is not nervous/anxious.     VITALS:   Blood pressure 120/73, pulse 70, temperature 97.6 F (36.4 C), temperature source Oral, resp. rate 16, height 5\' 4"  (1.626 m), weight 144 lb 11.2 oz (65.6 kg), SpO2 97%.  Wt Readings from Last 3 Encounters:  12/05/23 144 lb (65.3 kg)  11/30/23 144 lb 3.2 oz (65.4 kg)  11/28/23 144 lb 11.2 oz (65.6 kg)    Body mass index is 24.84 kg/m.  Performance status (ECOG): 0 - Asymptomatic  PHYSICAL EXAM:  Physical Exam Vitals and nursing note reviewed.  Constitutional:      General: She is not in acute distress.    Appearance:  Normal appearance. She is normal weight. She is not ill-appearing, toxic-appearing or diaphoretic.  HENT:     Head: Normocephalic and atraumatic.     Right Ear: Tympanic membrane, ear canal and external ear normal. There is no impacted cerumen.     Left Ear: Tympanic membrane, ear canal and external ear normal. There is no impacted cerumen.     Nose: Nose normal. No congestion or rhinorrhea.     Mouth/Throat:     Mouth: Mucous membranes are moist.     Pharynx: Oropharynx is clear. No oropharyngeal exudate or posterior oropharyngeal erythema.  Eyes:     General: No scleral icterus.       Right eye: No discharge.        Left eye: No discharge.     Extraocular  Movements: Extraocular movements intact.     Conjunctiva/sclera: Conjunctivae normal.     Pupils: Pupils are equal, round, and reactive to light.  Neck:     Vascular: No carotid bruit.     Comments: Fait scar of the anterior neck  Cardiovascular:     Rate and Rhythm: Normal rate and regular rhythm.     Pulses: Normal pulses.     Heart sounds: Normal heart sounds. No murmur heard.    No friction rub. No gallop.  Pulmonary:     Effort: Pulmonary effort is normal. No respiratory distress.     Breath sounds: Normal breath sounds. No stridor. No wheezing, rhonchi or rales.  Chest:     Chest wall: No tenderness.     Comments: Well healed scar along the superior areolar complex on the right breast Faint resolving ecchymosis of the lower inner quadrant of the right breast Loop recorder in the upper inner quadrant of the left breast Abdominal:     General: Bowel sounds are normal. There is no distension.     Palpations: Abdomen is soft. There is no hepatomegaly, splenomegaly or mass.     Tenderness: There is no abdominal tenderness. There is no right CVA tenderness, left CVA tenderness, guarding or rebound.     Hernia: No hernia is present.  Musculoskeletal:        General: Normal range of motion.     Cervical back: Normal range of  motion and neck supple. No rigidity or tenderness.     Right lower leg: No edema.     Left lower leg: No edema.  Lymphadenopathy:     Cervical: No cervical adenopathy.     Right cervical: No superficial, deep or posterior cervical adenopathy.    Left cervical: No superficial, deep or posterior cervical adenopathy.     Upper Body:     Right upper body: No supraclavicular, axillary or pectoral adenopathy.     Left upper body: No supraclavicular, axillary or pectoral adenopathy.  Skin:    General: Skin is warm and dry.     Coloration: Skin is not jaundiced or pale.     Findings: No bruising, erythema, lesion or rash.  Neurological:     General: No focal deficit present.     Mental Status: She is alert and oriented to person, place, and time. Mental status is at baseline.     Cranial Nerves: No cranial nerve deficit.     Sensory: No sensory deficit.     Motor: No weakness.     Coordination: Coordination normal.     Gait: Gait normal.     Deep Tendon Reflexes: Reflexes normal.  Psychiatric:        Mood and Affect: Mood normal.        Behavior: Behavior normal.        Thought Content: Thought content normal.        Judgment: Judgment normal.    LABS:      Latest Ref Rng & Units 11/28/2023    3:18 PM 05/05/2022   10:19 AM 04/26/2018   11:07 AM  CBC  WBC 4.0 - 10.5 K/uL 5.9  11.0    Hemoglobin 12.0 - 15.0 g/dL 86.5  78.4  69.6   Hematocrit 36.0 - 46.0 % 39.9  47.7  39.0   Platelets 150 - 400 K/uL 230  346        Latest Ref Rng & Units 11/28/2023    3:18 PM 05/05/2022   10:19  AM 05/04/2021    1:25 PM  CMP  Glucose 70 - 99 mg/dL 89  86  82   BUN 8 - 23 mg/dL 19  27  21    Creatinine 0.44 - 1.00 mg/dL 1.61  0.96  0.45   Sodium 135 - 145 mmol/L 143  140  142   Potassium 3.5 - 5.1 mmol/L 4.0  3.9  4.4   Chloride 98 - 111 mmol/L 104  102  104   CO2 22 - 32 mmol/L 27  24  23    Calcium  8.9 - 10.3 mg/dL 9.9  9.1  40.9   Total Protein 6.5 - 8.1 g/dL 7.3  6.8  7.4   Total  Bilirubin 0.0 - 1.2 mg/dL 0.4  0.7  0.4   Alkaline Phos 38 - 126 U/L 83  78  95   AST 15 - 41 U/L 21  22  18    ALT 0 - 44 U/L 13  38  13   No results found for: "CEA1", "CEA" / No results found for: "CEA1", "CEA" No results found for: "PSA1" No results found for: "WJX914" No results found for: "CAN125"  No results found for: "TOTALPROTELP", "ALBUMINELP", "A1GS", "A2GS", "BETS", "BETA2SER", "GAMS", "MSPIKE", "SPEI" No results found for: "TIBC", "FERRITIN", "IRONPCTSAT" No results found for: "LDH"  STUDIES:  Exam: 11/23/2023 Ultrasound of the Right Upper Quadrant Impression: Hepatomegaly  Hepatic cysts measuring 0.9 x 0.9 x 0.7cm and 12.7 x 12.2 x 10.8cm, right lobe of the liver. 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  Simple cyst measuring 1.1 x 1.0 x 0.9cm within mid pole, right kidney No evidence of scites  MM Breast Surgical Specimen Result Date: 12/05/2023 CLINICAL DATA:  Specimen radiograph. EXAM: SPECIMEN RADIOGRAPH OF THE RIGHT BREAST COMPARISON:  Previous exam(s). FINDINGS: Status post excision of the left breast. The radioactive seed is present, completely intact, and were marked for pathology. IMPRESSION: Specimen radiograph of the right breast. Electronically Signed   By: Delwin Files M.D.   On: 12/05/2023 11:41   MM RT RADIOACTIVE SEED LOC MAMMO GUIDE Result Date: 12/04/2023 CLINICAL DATA:  73 year old female presenting seed localization of the right breast. Patient has newly diagnosed right breast DCIS. EXAM: MAMMOGRAPHIC GUIDED RADIOACTIVE SEED LOCALIZATION OF THE RIGHT BREAST COMPARISON:  Previous exam(s). FINDINGS: Patient presents for radioactive seed localization prior to right breast lumpectomy. I met with the patient and we discussed the procedure of seed localization including benefits and alternatives. We discussed the high likelihood of a successful  procedure. We discussed the risks of the procedure including infection, bleeding, tissue injury and further surgery. We discussed the low dose of radioactivity involved in the procedure. Informed, written consent was given. The usual time-out protocol was performed immediately prior to the procedure. Using mammographic guidance, sterile technique, 1% lidocaine  and an I-125 radioactive seed, the biopsy site in the lower outer right breast was localized using a lateral approach (the X biopsy marking clip is significantly displaced medially from the biopsy site). The follow-up mammogram images confirm the seed in the expected location and were marked for Dr. Delane Fear. Follow-up survey of the patient confirms presence of the radioactive seed. Order number of I-125 seed:  [id number]. Total activity: 0.245 mCi reference Date: 10/29/2023 The patient tolerated the procedure well and was [facility]. She was given instructions regarding seed removal. IMPRESSION: Radioactive seed localization right breast. No apparent complications. Electronically Signed  By: Allena Ito M.D.   On: 12/04/2023 08:32   CUP PACEART REMOTE DEVICE CHECK Result Date: 11/26/2023 ILR summary report received. Battery status OK. Normal device function. No new symptom, tachy, brady, or pause episodes. No new AF episodes. Monthly summary reports and ROV/PRN - CS, CVRS  ECHOCARDIOGRAM COMPLETE Result Date: 11/16/2023    ECHOCARDIOGRAM REPORT   Patient Name:   JENNYFER NICKOLSON Date of Exam: 11/16/2023 Medical Rec #:  [id number]      Height:       64.0 in Accession #:    [id number]     Weight:       146.8 lb Date of Birth:  Mar 09, 1951      BSA:          1.715 m Patient Age:    73 years       BP:           104/72 mmHg Patient Gender: F              HR:           66 bpm. Exam Location:  [place] Procedure: 2D Echo, Cardiac Doppler, Color Doppler and Strain Analysis (Both            Spectral and Color Flow Doppler were utilized  during procedure). Indications:    Dyspnea, unspecified type [R06.00 (ICD-10-CM)]  History:        Patient has prior history of Echocardiogram examinations, most                 recent 05/09/2017. COPD, Arrythmias:Atrial Fibrillation,                 Tachycardia and RBBB, Signs/Symptoms:Dyspnea; Risk                 Factors:Dyslipidemia.  Sonographer:    Erminia Hazel RDCS Referring Phys: [phone] JENNIFER C WOODY IMPRESSIONS  1. Large cyctic lesion noted most likely in the liver. Abdominal CT or US  of the liver should be considered. Left ventricular ejection fraction, by estimation, is 50 to 55%. The left ventricle has low normal function. The left ventricle has no regional wall motion abnormalities. Left ventricular diastolic parameters are consistent with Grade I diastolic dysfunction (impaired relaxation). The average left ventricular global longitudinal strain is -15.2 %. The global longitudinal strain is abnormal.  2. Right ventricular systolic function is normal. The right ventricular size is normal. There is normal pulmonary artery systolic pressure.  3. Left atrial size was moderately dilated.  4. The mitral valve is normal in structure. Mild mitral valve regurgitation. No evidence of mitral stenosis.  5. The aortic valve is normal in structure. Aortic valve regurgitation is mild. No aortic stenosis is present.  6. The inferior vena cava is normal in size with greater than 50% respiratory variability, suggesting right atrial pressure of 3 mmHg. FINDINGS  Left Ventricle: Large cyctic lesion noted most likely in the liver. Abdominal CT or US  of the liver should be considered. Left ventricular ejection fraction, by estimation, is 50 to 55%. The left ventricle has low normal function. The left ventricle has  no regional wall motion abnormalities. The average left ventricular global longitudinal strain is -15.2 %. Strain was performed and the global longitudinal strain is abnormal. The left ventricular internal  cavity size was normal in size. There is no left ventricular hypertrophy. Left ventricular diastolic parameters are consistent with Grade I diastolic dysfunction (impaired relaxation). Right Ventricle: The right ventricular size is normal. No increase  in right ventricular wall thickness. Right ventricular systolic function is normal. There is normal pulmonary artery systolic pressure. The tricuspid regurgitant velocity is 2.51 m/s, and  with an assumed right atrial pressure of 3 mmHg, the estimated right ventricular systolic pressure is 28.2 mmHg. Left Atrium: Left atrial size was moderately dilated. Right Atrium: Right atrial size was normal in size. Pericardium: There is no evidence of pericardial effusion. Mitral Valve: The mitral valve is normal in structure. Mild mitral valve regurgitation. No evidence of mitral valve stenosis. Tricuspid Valve: The tricuspid valve is normal in structure. Tricuspid valve regurgitation is not demonstrated. No evidence of tricuspid stenosis. Aortic Valve: The aortic valve is normal in structure. Aortic valve regurgitation is mild. Aortic regurgitation PHT measures 840 msec. No aortic stenosis is present. Pulmonic Valve: The pulmonic valve was normal in structure. Pulmonic valve regurgitation is not visualized. No evidence of pulmonic stenosis. Aorta: The aortic root is normal in size and structure. Venous: The inferior vena cava is normal in size with greater than 50% respiratory variability, suggesting right atrial pressure of 3 mmHg. IAS/Shunts: No atrial level shunt detected by color flow Doppler.  LEFT VENTRICLE PLAX 2D LVIDd:         5.60 cm   Diastology LVIDs:         4.30 cm   LV e' medial:    8.05 cm/s LV PW:         0.80 cm   LV E/e' medial:  7.3 LV IVS:        0.80 cm   LV e' lateral:   9.03 cm/s LVOT diam:     2.00 cm   LV E/e' lateral: 6.5 LV SV:         51 LV SV Index:   30        2D Longitudinal Strain LVOT Area:     3.14 cm  2D Strain GLS Avg:     -15.2 %  RIGHT  VENTRICLE             IVC RV Basal diam:  2.60 cm     IVC diam: 0.90 cm RV Mid diam:    2.20 cm RV S prime:     11.30 cm/s TAPSE (M-mode): 2.4 cm LEFT ATRIUM             Index        RIGHT ATRIUM           Index LA diam:        3.30 cm 1.92 cm/m   RA Area:     12.70 cm LA Vol (A2C):   72.2 ml 42.09 ml/m  RA Volume:   28.80 ml  16.79 ml/m LA Vol (A4C):   57.9 ml 33.75 ml/m LA Biplane Vol: 68.2 ml 39.76 ml/m  AORTIC VALVE LVOT Vmax:   64.35 cm/s LVOT Vmean:  44.150 cm/s LVOT VTI:    0.162 m AI PHT:      840 msec  AORTA Ao Root diam: 3.20 cm Ao Asc diam:  3.30 cm MITRAL VALVE                  TRICUSPID VALVE MV Area (PHT): 3.77 cm       TR Peak grad:   25.2 mmHg MV Decel Time: 201 msec       TR Vmax:        251.00 cm/s MR Peak grad:    74.0 mmHg MR Vmax:  430.00 cm/s  SHUNTS MR PISA:         0.39 cm     Systemic VTI:  0.16 m MR PISA Eff ROA: 4 mm        Systemic Diam: 2.00 cm MR PISA Radius:  0.25 cm MV E velocity: 58.80 cm/s MV A velocity: 82.95 cm/s MV E/A ratio:  0.71 Ralene Burger [name] Electronically signed by Ralene Burger [name] Signature Date/Time: 11/16/2023/3:05:53 PM    Final    MM RT BREAST BX W LOC DEV 1ST LESION IMAGE BX SPEC STEREO GUIDE Addendum Date: 11/12/2023 ADDENDUM REPORT: 11/12/2023 08:18 ADDENDUM: Pathology revealed DUCTAL CARCINOMA IN SITU, SOLID AND CRIBRIFORM TYPE WITH COMEDONECROSIS AND ASSOCIATED CALCIFICATIONS, NUCLEAR GRADE 2 OF 3, NECROSIS: PRESENT, CALCIFICATIONS: PRESENT of the RIGHT breast, outer central, (x clip). This was found to be concordant by Dr. Luann Rundle Mir. Pathology results were discussed with the patient by telephone. The patient reported doing well after the biopsy with tenderness and bruising at the site. Post biopsy instructions and care were reviewed and questions were answered. The patient was encouraged to call [facility] for any additional concerns. My direct phone number was provided. Surgical consultation has been  arranged with Dr. Enid Harry, per patient request, at [facility] on Nov 20, 2023. Pathology results reported by Kraig Peru, RN on 11/12/2023. Electronically Signed   By: Elester Grim M.D.   On: 11/12/2023 08:18   Result Date: 11/12/2023 CLINICAL DATA:  73 year old woman with indeterminate right breast calcifications presents for stereotactic guided core needle biopsy. EXAM: RIGHT BREAST STEREOTACTIC CORE NEEDLE BIOPSY COMPARISON:  Previous exam(s). FINDINGS: The patient and I discussed the procedure of stereotactic-guided biopsy including benefits and alternatives. We discussed the high likelihood of a successful procedure. We discussed the risks of the procedure including infection, bleeding, tissue injury, clip migration, and inadequate sampling. Informed written consent was given. The usual time out protocol was performed immediately prior to the procedure. Using sterile technique and 1% Lidocaine  as local anesthetic, under stereotactic guidance, a 9 gauge vacuum assisted device was used to perform core needle biopsy of calcifications in the outer right breast using a lateral approach. Specimen radiograph was performed showing calcifications. Lesion quadrant: Lower outer quadrant At the conclusion of the procedure, X shaped tissue marker clip was deployed into the biopsy cavity. Follow-up 2-view mammogram was performed and dictated separately. IMPRESSION: Stereotactic-guided biopsy of 0.9 cm group of right outer breast calcifications. No apparent complications. Electronically Signed: By: Elester Grim M.D. On: 11/07/2023 08:59   MM CLIP PLACEMENT RIGHT Result Date: 11/07/2023 CLINICAL DATA:  Status post stereotactic guided biopsy of right breast calcifications. EXAM: 3D DIAGNOSTIC RIGHT MAMMOGRAM POST STEREOTACTIC BIOPSY COMPARISON:  Previous exam(s). ACR Breast Density Category b: There are scattered areas of fibroglandular density. FINDINGS: 3D Mammographic images were obtained  following stereotactic guided biopsy of right breast calcifications. The biopsy marking clip is displaced approximately 4 cm medial to the biopsy cavity. IMPRESSION: X shaped biopsy marking clip is displaced approximately 4 cm medial to the biopsy cavity in the outer right breast. Final Assessment: Post Procedure Mammograms for Marker Placement Electronically Signed   By: Elester Grim M.D.   On: 11/07/2023 09:02    Pathology: 11/07/2023 Diagnosis: Breast, right, needle core biopsy, outer central, x clip: Ductal carcinoma in situ, solid and cribriform type with comedonecrosis and associated calcifications, nuclear grade 2 of 3 Necrosis is present Calcifications are present DCIS length: Scattered foci with the largest dimension  2.82mm in greatest linear, dimension on fragmented cores ER: 100% positive PR: 30% positive  EXAM: 10/25/2023 DIGITAL DIAGNOSTIC UNILATERAL RIGHT MAMMOGRAM IMPRESSION: 0.9 cm group of indeterminate posterior slightly OUTER central RIGHT breast calcifications. Tissue sampling is recommended   I,Tina M [name],acting as a [name] for Tina [name].,have documented all relevant documentation on the behalf of Tina [name],as directed by  Tina [name] while in the presence of Tina [name].

## 2023-11-29 ENCOUNTER — Ambulatory Visit: Admitting: Hematology and Oncology

## 2023-11-29 ENCOUNTER — Other Ambulatory Visit

## 2023-11-29 NOTE — Pre-Procedure Instructions (Signed)
 Surgical Instructions   Your procedure is scheduled on Dec 05, 2023. Report to [facility] Main Entrance "A" at 9:00 A.M., then check in with the Admitting office. Any questions or running late day of surgery: call [phone]  Questions prior to your surgery date: call [phone], Monday-Friday, 8am-4pm. If you experience any cold or flu symptoms such as cough, fever, chills, shortness of breath, etc. between now and your scheduled surgery, please notify us  at the above number.     Remember:  Do not eat after midnight the night before your surgery   You may drink clear liquids until 8:00 AM the morning of your surgery.   Clear liquids allowed are: Water , Non-Citrus Juices (without pulp), Carbonated Beverages, Clear Tea (no milk, honey, etc.), Black Coffee Only (NO MILK, CREAM OR POWDERED CREAMER of any kind), and Gatorade.  Patient Instructions  The night before surgery:  No food after midnight. ONLY clear liquids after midnight  The day of surgery (if you do NOT have diabetes):  Drink ONE (1) Pre-Surgery Clear Ensure by 8:00 AM the morning of surgery. Drink in one sitting. Do not sip.  This drink was given to you during your hospital  pre-op appointment visit.  Nothing else to drink after completing the  Pre-Surgery Clear Ensure.         If you have questions, please contact your surgeon's office.    Take these medicines the morning of surgery with A SIP OF WATER : ezetimibe  (ZETIA )  famotidine (PEPCID)  flecainide  (TAMBOCOR )  fluticasone  (FLONASE  ALLERGY RELIEF) nasal spray  fluticasone  (FLOVENT  HFA) inhaler  propranolol (INDERAL)    May take these medicines IF NEEDED: acetaminophen  (TYLENOL )  meclizine  (ANTIVERT )    STOP taking your ELIQUIS  two days prior to surgery. Your last dose will be May 18th.   One week prior to surgery, STOP taking any Aspirin (unless otherwise instructed by your surgeon) Aleve, Naproxen, Ibuprofen , Motrin , Advil , Goody's, BC's, all  herbal medications, fish oil, and non-prescription vitamins.                     Do NOT Smoke (Tobacco/Vaping) for 24 hours prior to your procedure.  If you use a CPAP at night, you may bring your mask/headgear for your overnight stay.   You will be asked to remove any contacts, glasses, piercing's, hearing aid's, dentures/partials prior to surgery. Please bring cases for these items if needed.    Patients discharged the day of surgery will not be allowed to drive home, and someone needs to stay with them for 24 hours.  SURGICAL WAITING ROOM VISITATION Patients may have no more than 2 support people in the waiting area - these visitors may rotate.   Pre-op nurse will coordinate an appropriate time for 1 ADULT support person, who may not rotate, to accompany patient in pre-op.  Children under the age of 66 must have an adult with them who is not the patient and must remain in the main waiting area with an adult.  If the patient needs to stay at the hospital during part of their recovery, the visitor guidelines for inpatient rooms apply.  Please refer to the [facility] website for the visitor guidelines for any additional information.   If you received a COVID test during your pre-op visit  it is requested that you wear a mask when out in public, stay away from anyone that may not be feeling well and notify your surgeon if you develop symptoms. If you have  been in contact with anyone that has tested positive in the last 10 days please notify you surgeon.      Pre-operative CHG Bathing Instructions   You can play a key role in reducing the risk of infection after surgery. Your skin needs to be as free of germs as possible. You can reduce the number of germs on your skin by washing with CHG (chlorhexidine  gluconate) soap before surgery. CHG is an antiseptic soap that kills germs and continues to kill germs even after washing.   DO NOT use if you have an allergy to chlorhexidine /CHG or  antibacterial soaps. If your skin becomes reddened or irritated, stop using the CHG and notify one of our RNs at [phone].              TAKE A SHOWER THE NIGHT BEFORE SURGERY AND THE DAY OF SURGERY    Please keep in mind the following:  DO NOT shave, including legs and underarms, 48 hours prior to surgery.   You may shave your face before/day of surgery.  Place clean sheets on your bed the night before surgery Use a clean washcloth (not used since being washed) for each shower. DO NOT sleep with pet's night before surgery.  CHG Shower Instructions:  Wash your face and private area with normal soap. If you choose to wash your hair, wash first with your normal shampoo.  After you use shampoo/soap, rinse your hair and body thoroughly to remove shampoo/soap residue.  Turn the water  OFF and apply half the bottle of CHG soap to a CLEAN washcloth.  Apply CHG soap ONLY FROM YOUR NECK DOWN TO YOUR TOES (washing for 3-5 minutes)  DO NOT use CHG soap on face, private areas, open wounds, or sores.  Pay special attention to the area where your surgery is being performed.  If you are having back surgery, having someone wash your back for you may be helpful. Wait 2 minutes after CHG soap is applied, then you may rinse off the CHG soap.  Pat dry with a clean towel  Put on clean pajamas    Additional instructions for the day of surgery: DO NOT APPLY any lotions, deodorants, cologne, or perfumes.   Do not wear jewelry or makeup Do not wear nail polish, gel polish, artificial nails, or any other type of covering on natural nails (fingers and toes) Do not bring valuables to the hospital. [facility] is not responsible for valuables/personal belongings. Put on clean/comfortable clothes.  Please brush your teeth.  Ask your nurse before applying any prescription medications to the skin.

## 2023-11-30 ENCOUNTER — Encounter (HOSPITAL_COMMUNITY): Payer: Self-pay

## 2023-11-30 ENCOUNTER — Encounter (HOSPITAL_COMMUNITY)
Admission: RE | Admit: 2023-11-30 | Discharge: 2023-11-30 | Disposition: A | Discharge status: Home / Self Care | Source: Ambulatory Visit | Attending: General Surgery | Admitting: General Surgery

## 2023-11-30 ENCOUNTER — Other Ambulatory Visit: Payer: Self-pay

## 2023-11-30 DIAGNOSIS — I340 Nonrheumatic mitral (valve) insufficiency: Secondary | ICD-10-CM | POA: Insufficient documentation

## 2023-11-30 DIAGNOSIS — Z7901 Long term (current) use of anticoagulants: Secondary | ICD-10-CM | POA: Diagnosis not present

## 2023-11-30 DIAGNOSIS — Z01812 Encounter for preprocedural laboratory examination: Secondary | ICD-10-CM | POA: Insufficient documentation

## 2023-11-30 DIAGNOSIS — E785 Hyperlipidemia, unspecified: Secondary | ICD-10-CM | POA: Diagnosis not present

## 2023-11-30 DIAGNOSIS — I480 Paroxysmal atrial fibrillation: Secondary | ICD-10-CM | POA: Insufficient documentation

## 2023-11-30 HISTORY — DX: Malignant (primary) neoplasm, unspecified: C80.1

## 2023-11-30 HISTORY — DX: Cardiac murmur, unspecified: R01.1

## 2023-11-30 NOTE — Progress Notes (Signed)
 PCP -Harvest Lineman, MD  Cardiologist -  Zoe Hinds, MD  Electrophysiologist: Lei Pump, MD  PPM/ICD - Patient has a loop recorder Device Orders - n/a Rep Notified - n/a  Chest x-ray -  EKG - 10-04-23 Stress Test - 08-28-14 ECHO - 11-16-23 Cardiac Cath -   Sleep Study - denies CPAP -   Denies- Dm denies  Blood Thinner Instructions: ELIQUIS  hold x2 days prior to procedure Aspirin Instructions:n/a  ERAS Protcol - clear liquids until 8:00 am PRE-SURGERY Ensure   COVID TEST- n/a   Anesthesia review: yes cardiac history  Patient denies shortness of breath, fever, cough and chest pain at PAT appointment   All instructions explained to the patient, with a verbal understanding of the material. Patient agrees to go over the instructions while at home for a better understanding. Patient also instructed to self quarantine after being tested for COVID-19. The opportunity to ask questions was provided.

## 2023-12-03 DIAGNOSIS — M810 Age-related osteoporosis without current pathological fracture: Secondary | ICD-10-CM | POA: Diagnosis not present

## 2023-12-03 NOTE — Anesthesia Preprocedure Evaluation (Addendum)
 Anesthesia Evaluation  Patient identified by MRN, date of birth, ID band Patient awake    Reviewed: Allergy & Precautions, H&P , NPO status , Patient's Chart, lab work & pertinent test results, reviewed documented beta blocker date and time   History of Anesthesia Complications (+) PONV and history of anesthetic complications  Airway Mallampati: III  TM Distance: >3 FB Neck ROM: Full    Dental no notable dental hx. (+) Teeth Intact, Dental Advisory Given   Pulmonary COPD,  COPD inhaler   Pulmonary exam normal breath sounds clear to auscultation       Cardiovascular + dysrhythmias Atrial Fibrillation and Supra Ventricular Tachycardia  Rhythm:Regular Rate:Normal     Neuro/Psych   Anxiety Depression    negative neurological ROS     GI/Hepatic Neg liver ROS, hiatal hernia,GERD  Medicated,,  Endo/Other  diabetes    Renal/GU negative Renal ROS  negative genitourinary   Musculoskeletal  (+) Arthritis , Osteoarthritis,    Abdominal   Peds  Hematology negative hematology ROS (+)   Anesthesia Other Findings   Reproductive/Obstetrics negative OB ROS                             Anesthesia Physical Anesthesia Plan  ASA: 3  Anesthesia Plan: General   Post-op Pain Management: Tylenol  PO (pre-op)*   Induction: Intravenous  PONV Risk Score and Plan: 4 or greater and Ondansetron , Dexamethasone , Propofol  infusion and TIVA  Airway Management Planned: LMA  Additional Equipment:   Intra-op Plan:   Post-operative Plan: Extubation in OR  Informed Consent: I have reviewed the patients History and Physical, chart, labs and discussed the procedure including the risks, benefits and alternatives for the proposed anesthesia with the patient or authorized representative who has indicated his/her understanding and acceptance.     Dental advisory given  Plan Discussed with: CRNA  Anesthesia Plan  Comments: (PAT note by Rudy Costain, PA-C: 73 year old female follows with cardiology for history of mitral valve regurgitation, dyslipidemia, PAF on low-dose flecainide  and Eliquis , SVT, presence of ILR.  Stress test 2016 was low risk.  Echo 11/16/2023 showed EF of 50 to 55%, grade 1 DD, normal RV function, mild mitral regurgitation.  A large cystic lesion was also incidentally noted, most likely in the liver.  This result was communicated to the patient's PCP at that time.  Cardiac clearance per telephone encounter 11/21/2023 by Slater Duncan, NP, "Chart reviewed as part of pre-operative protocol coverage. Given past medical history and time since last visit, based on ACC/AHA guidelines, Tina [name] would be at acceptable risk for the planned procedure without further cardiovascular testing. Patient was advised that if she develops new symptoms prior to surgery to contact our office to arrange a follow-up appointment. Se verbalized understanding. Per office protocol, patient can hold Eliquis  for 2 days prior to procedure.   Patient will not need bridging with Lovenox (enoxaparin) around procedure."  Patient reports she was instructed to hold Eliquis  2 days prior to procedure.  Other pertinent history includes PONV, GERD on H2 blocker,  Preop labs reviewed, WNL.  EKG 10/04/2023: Normal sinus rhythm.  Rate 83. Right bundle branch block. T wave abnormality, consider lateral ischemia.  No significant change.  TTE 11/16/2023: 1. Large cyctic lesion noted most likely in the liver. Abdominal CT or US   of the liver should be considered. Left ventricular ejection fraction, by  estimation, is 50 to 55%. The left ventricle has low normal  function. The  left ventricle has no regional  wall motion abnormalities. Left ventricular diastolic parameters are  consistent with Grade I diastolic dysfunction (impaired relaxation). The  average left ventricular global longitudinal strain is -15.2 %. The global   longitudinal strain is abnormal.  2. Right ventricular systolic function is normal. The right ventricular  size is normal. There is normal pulmonary artery systolic pressure.  3. Left atrial size was moderately dilated.  4. The mitral valve is normal in structure. Mild mitral valve  regurgitation. No evidence of mitral stenosis.  5. The aortic valve is normal in structure. Aortic valve regurgitation is  mild. No aortic stenosis is present.  6. The inferior vena cava is normal in size with greater than 50%  respiratory variability, suggesting right atrial pressure of 3 mmHg.   )        Anesthesia Quick Evaluation

## 2023-12-03 NOTE — Progress Notes (Signed)
 Anesthesia Chart Review:  73 year old female follows with cardiology for history of mitral valve regurgitation, dyslipidemia, PAF on low-dose flecainide  and Eliquis , SVT, presence of ILR.  Stress test 2016 was low risk.  Echo 11/16/2023 showed EF of 50 to 55%, grade 1 DD, normal RV function, mild mitral regurgitation.  A large cystic lesion was also incidentally noted, most likely in the liver.  This result was communicated to the patient's PCP at that time.  Cardiac clearance per telephone encounter 11/21/2023 by Slater Duncan, NP, "Chart reviewed as part of pre-operative protocol coverage. Given past medical history and time since last visit, based on ACC/AHA guidelines, Tina [name] would be at acceptable risk for the planned procedure without further cardiovascular testing. Patient was advised that if she develops new symptoms prior to surgery to contact our office to arrange a follow-up appointment. Se verbalized understanding. Per office protocol, patient can hold Eliquis  for 2 days prior to procedure.   Patient will not need bridging with Lovenox (enoxaparin) around procedure."  Patient reports she was instructed to hold Eliquis  2 days prior to procedure.  Other pertinent history includes PONV, GERD on H2 blocker,  Preop labs reviewed, WNL.  EKG 10/04/2023: Normal sinus rhythm.  Rate 83. Right bundle branch block. T wave abnormality, consider lateral ischemia.  No significant change.  TTE 11/16/2023:  1. Large cyctic lesion noted most likely in the liver. Abdominal CT or US   of the liver should be considered. Left ventricular ejection fraction, by  estimation, is 50 to 55%. The left ventricle has low normal function. The  left ventricle has no regional  wall motion abnormalities. Left ventricular diastolic parameters are  consistent with Grade I diastolic dysfunction (impaired relaxation). The  average left ventricular global longitudinal strain is -15.2 %. The global  longitudinal strain is  abnormal.   2. Right ventricular systolic function is normal. The right ventricular  size is normal. There is normal pulmonary artery systolic pressure.   3. Left atrial size was moderately dilated.   4. The mitral valve is normal in structure. Mild mitral valve  regurgitation. No evidence of mitral stenosis.   5. The aortic valve is normal in structure. Aortic valve regurgitation is  mild. No aortic stenosis is present.   6. The inferior vena cava is normal in size with greater than 50%  respiratory variability, suggesting right atrial pressure of 3 mmHg.     Tina [name] [facility] Short Stay Center/Anesthesiology Phone [phone] 12/03/2023 11:44 AM

## 2023-12-04 ENCOUNTER — Ambulatory Visit
Admission: RE | Admit: 2023-12-04 | Discharge: 2023-12-04 | Disposition: A | Discharge status: Home / Self Care | Source: Ambulatory Visit | Attending: General Surgery | Admitting: General Surgery

## 2023-12-04 DIAGNOSIS — D0511 Intraductal carcinoma in situ of right breast: Secondary | ICD-10-CM

## 2023-12-04 HISTORY — PX: BREAST BIOPSY: SHX20

## 2023-12-05 ENCOUNTER — Ambulatory Visit
Admission: RE | Admit: 2023-12-05 | Discharge: 2023-12-05 | Disposition: A | Discharge status: Home / Self Care | Source: Ambulatory Visit | Attending: General Surgery | Admitting: General Surgery

## 2023-12-05 ENCOUNTER — Other Ambulatory Visit: Payer: Self-pay

## 2023-12-05 ENCOUNTER — Ambulatory Visit (HOSPITAL_BASED_OUTPATIENT_CLINIC_OR_DEPARTMENT_OTHER): Admitting: Anesthesiology

## 2023-12-05 ENCOUNTER — Encounter (HOSPITAL_COMMUNITY)
Admission: RE | Disposition: A | Discharge status: Home / Self Care | Payer: Self-pay | Source: Home / Self Care | Attending: General Surgery

## 2023-12-05 ENCOUNTER — Ambulatory Visit (HOSPITAL_COMMUNITY)
Admission: RE | Admit: 2023-12-05 | Discharge: 2023-12-05 | Disposition: A | Discharge status: Home / Self Care | Attending: General Surgery | Admitting: General Surgery

## 2023-12-05 ENCOUNTER — Encounter (HOSPITAL_COMMUNITY): Payer: Self-pay | Admitting: General Surgery

## 2023-12-05 ENCOUNTER — Ambulatory Visit (HOSPITAL_COMMUNITY): Payer: Self-pay | Admitting: Physician Assistant

## 2023-12-05 DIAGNOSIS — D0511 Intraductal carcinoma in situ of right breast: Secondary | ICD-10-CM

## 2023-12-05 DIAGNOSIS — N6041 Mammary duct ectasia of right breast: Secondary | ICD-10-CM | POA: Insufficient documentation

## 2023-12-05 DIAGNOSIS — I700 Atherosclerosis of aorta: Secondary | ICD-10-CM | POA: Insufficient documentation

## 2023-12-05 DIAGNOSIS — E119 Type 2 diabetes mellitus without complications: Secondary | ICD-10-CM

## 2023-12-05 DIAGNOSIS — I4891 Unspecified atrial fibrillation: Secondary | ICD-10-CM | POA: Diagnosis not present

## 2023-12-05 DIAGNOSIS — Z1721 Progesterone receptor positive status: Secondary | ICD-10-CM | POA: Diagnosis not present

## 2023-12-05 DIAGNOSIS — N6021 Fibroadenosis of right breast: Secondary | ICD-10-CM | POA: Insufficient documentation

## 2023-12-05 DIAGNOSIS — J449 Chronic obstructive pulmonary disease, unspecified: Secondary | ICD-10-CM | POA: Diagnosis not present

## 2023-12-05 DIAGNOSIS — N62 Hypertrophy of breast: Secondary | ICD-10-CM | POA: Diagnosis not present

## 2023-12-05 DIAGNOSIS — I083 Combined rheumatic disorders of mitral, aortic and tricuspid valves: Secondary | ICD-10-CM | POA: Diagnosis not present

## 2023-12-05 DIAGNOSIS — K219 Gastro-esophageal reflux disease without esophagitis: Secondary | ICD-10-CM | POA: Insufficient documentation

## 2023-12-05 DIAGNOSIS — Z7901 Long term (current) use of anticoagulants: Secondary | ICD-10-CM | POA: Diagnosis not present

## 2023-12-05 DIAGNOSIS — Z170 Estrogen receptor positive status [ER+]: Secondary | ICD-10-CM | POA: Insufficient documentation

## 2023-12-05 DIAGNOSIS — N6011 Diffuse cystic mastopathy of right breast: Secondary | ICD-10-CM | POA: Diagnosis not present

## 2023-12-05 DIAGNOSIS — N641 Fat necrosis of breast: Secondary | ICD-10-CM | POA: Diagnosis not present

## 2023-12-05 DIAGNOSIS — F418 Other specified anxiety disorders: Secondary | ICD-10-CM | POA: Diagnosis not present

## 2023-12-05 DIAGNOSIS — I480 Paroxysmal atrial fibrillation: Secondary | ICD-10-CM | POA: Diagnosis not present

## 2023-12-05 HISTORY — PX: BREAST LUMPECTOMY WITH RADIOACTIVE SEED LOCALIZATION: SHX6424

## 2023-12-05 SURGERY — BREAST LUMPECTOMY WITH RADIOACTIVE SEED LOCALIZATION
Anesthesia: General | Laterality: Right

## 2023-12-05 MED ORDER — CHLORHEXIDINE GLUCONATE CLOTH 2 % EX PADS: 6.0000 | MEDICATED_PAD | Freq: Once | CUTANEOUS | Status: DC

## 2023-12-05 MED ORDER — CHLORHEXIDINE GLUCONATE 0.12 % MT SOLN
15.0000 mL | Freq: Once | OROMUCOSAL | Status: AC
  Administered 2023-12-05: 15 mL via OROMUCOSAL
  Filled 2023-12-05: qty 15

## 2023-12-05 MED ORDER — ACETAMINOPHEN 500 MG PO TABS
1000.0000 mg | ORAL_TABLET | ORAL | Status: AC
  Administered 2023-12-05: 1000 mg via ORAL
  Filled 2023-12-05: qty 2

## 2023-12-05 MED ORDER — EPHEDRINE SULFATE-NACL 50-0.9 MG/10ML-% IV SOSY
PREFILLED_SYRINGE | INTRAVENOUS | Status: DC | PRN
  Administered 2023-12-05: 10 mg via INTRAVENOUS

## 2023-12-05 MED ORDER — FENTANYL CITRATE (PF) 250 MCG/5ML IJ SOLN
INTRAMUSCULAR | Status: DC | PRN
  Administered 2023-12-05 (×2): 50 ug via INTRAVENOUS

## 2023-12-05 MED ORDER — DEXAMETHASONE SODIUM PHOSPHATE 10 MG/ML IJ SOLN
INTRAMUSCULAR | Status: AC
  Filled 2023-12-05: qty 1

## 2023-12-05 MED ORDER — PROPOFOL 10 MG/ML IV BOLUS
INTRAVENOUS | Status: AC
  Filled 2023-12-05: qty 20

## 2023-12-05 MED ORDER — LIDOCAINE 2% (20 MG/ML) 5 ML SYRINGE
INTRAMUSCULAR | Status: AC
  Filled 2023-12-05: qty 5

## 2023-12-05 MED ORDER — ONDANSETRON HCL 4 MG/2ML IJ SOLN
INTRAMUSCULAR | Status: AC
  Filled 2023-12-05: qty 2

## 2023-12-05 MED ORDER — PROPOFOL 10 MG/ML IV BOLUS
INTRAVENOUS | Status: DC | PRN
  Administered 2023-12-05: 125 ug/kg/min via INTRAVENOUS
  Administered 2023-12-05: 200 mg via INTRAVENOUS

## 2023-12-05 MED ORDER — 0.9 % SODIUM CHLORIDE (POUR BTL) OPTIME
TOPICAL | Status: DC | PRN
  Administered 2023-12-05: 1000 mL

## 2023-12-05 MED ORDER — FENTANYL CITRATE (PF) 250 MCG/5ML IJ SOLN
INTRAMUSCULAR | Status: AC
Start: 2023-12-05 — End: ?
  Filled 2023-12-05: qty 5

## 2023-12-05 MED ORDER — CEFAZOLIN SODIUM-DEXTROSE 2-4 GM/100ML-% IV SOLN
2.0000 g | INTRAVENOUS | Status: AC
  Administered 2023-12-05: 2 g via INTRAVENOUS
  Filled 2023-12-05: qty 100

## 2023-12-05 MED ORDER — BUPIVACAINE-EPINEPHRINE (PF) 0.25% -1:200000 IJ SOLN
INTRAMUSCULAR | Status: AC
  Filled 2023-12-05: qty 30

## 2023-12-05 MED ORDER — DEXAMETHASONE SODIUM PHOSPHATE 10 MG/ML IJ SOLN
INTRAMUSCULAR | Status: DC | PRN
  Administered 2023-12-05: 10 mg via INTRAVENOUS

## 2023-12-05 MED ORDER — LACTATED RINGERS IV SOLN: INTRAVENOUS | Status: DC

## 2023-12-05 MED ORDER — ORAL CARE MOUTH RINSE: 15.0000 mL | Freq: Once | OROMUCOSAL | Status: AC

## 2023-12-05 MED ORDER — EPHEDRINE 5 MG/ML INJ
INTRAVENOUS | Status: AC
  Filled 2023-12-05: qty 5

## 2023-12-05 MED ORDER — KETOROLAC TROMETHAMINE 15 MG/ML IJ SOLN
INTRAMUSCULAR | Status: DC | PRN
  Administered 2023-12-05: 15 mg via INTRAVENOUS

## 2023-12-05 MED ORDER — ONDANSETRON HCL 4 MG/2ML IJ SOLN
INTRAMUSCULAR | Status: DC | PRN
  Administered 2023-12-05: 4 mg via INTRAVENOUS

## 2023-12-05 MED ORDER — BUPIVACAINE-EPINEPHRINE 0.25% -1:200000 IJ SOLN
INTRAMUSCULAR | Status: DC | PRN
  Administered 2023-12-05: 15 mL

## 2023-12-05 MED ORDER — LIDOCAINE 2% (20 MG/ML) 5 ML SYRINGE
INTRAMUSCULAR | Status: DC | PRN
  Administered 2023-12-05: 60 mg via INTRAVENOUS

## 2023-12-05 MED ORDER — ENSURE PRE-SURGERY PO LIQD: 296.0000 mL | Freq: Once | ORAL | Status: DC

## 2023-12-05 SURGICAL SUPPLY — 34 items
BAG COUNTER SPONGE SURGICOUNT (BAG) ×1 IMPLANT
BINDER BREAST LRG (GAUZE/BANDAGES/DRESSINGS) IMPLANT
BINDER BREAST XLRG (GAUZE/BANDAGES/DRESSINGS) IMPLANT
CANISTER SUCTION 3000ML PPV (SUCTIONS) ×1 IMPLANT
CHLORAPREP W/TINT 26 (MISCELLANEOUS) ×1 IMPLANT
CLIP APPLIE 9.375 MED OPEN (MISCELLANEOUS) IMPLANT
CLIP TI MEDIUM 6 (CLIP) IMPLANT
CNTNR URN SCR LID CUP LEK RST (MISCELLANEOUS) IMPLANT
COVER PROBE W GEL 5X96 (DRAPES) ×1 IMPLANT
COVER SURGICAL LIGHT HANDLE (MISCELLANEOUS) ×1 IMPLANT
DERMABOND ADVANCED .7 DNX12 (GAUZE/BANDAGES/DRESSINGS) ×1 IMPLANT
DEVICE DUBIN SPECIMEN MAMMOGRA (MISCELLANEOUS) ×1 IMPLANT
DRAPE CHEST BREAST 15X10 FENES (DRAPES) ×1 IMPLANT
ELECT COATED BLADE 2.86 ST (ELECTRODE) ×1 IMPLANT
ELECTRODE REM PT RTRN 9FT ADLT (ELECTROSURGICAL) ×1 IMPLANT
GLOVE BIO SURGEON STRL SZ7 (GLOVE) ×2 IMPLANT
GLOVE BIOGEL PI IND STRL 7.5 (GLOVE) ×1 IMPLANT
GOWN STRL REUS W/ TWL LRG LVL3 (GOWN DISPOSABLE) ×2 IMPLANT
KIT BASIN OR (CUSTOM PROCEDURE TRAY) ×1 IMPLANT
KIT MARKER MARGIN INK (KITS) ×1 IMPLANT
LIGHT WAVEGUIDE WIDE FLAT (MISCELLANEOUS) IMPLANT
NDL HYPO 25GX1X1/2 BEV (NEEDLE) ×1 IMPLANT
NEEDLE HYPO 25GX1X1/2 BEV (NEEDLE) ×1 IMPLANT
NS IRRIG 1000ML POUR BTL (IV SOLUTION) ×1 IMPLANT
PACK GENERAL/GYN (CUSTOM PROCEDURE TRAY) ×1 IMPLANT
STRIP CLOSURE SKIN 1/2X4 (GAUZE/BANDAGES/DRESSINGS) ×1 IMPLANT
SUT MNCRL AB 4-0 PS2 18 (SUTURE) ×1 IMPLANT
SUT MON AB 5-0 PS2 18 (SUTURE) IMPLANT
SUT SILK 2 0 SH (SUTURE) IMPLANT
SUT VIC AB 2-0 SH 27XBRD (SUTURE) ×1 IMPLANT
SUT VIC AB 3-0 SH 27X BRD (SUTURE) ×1 IMPLANT
SYR CONTROL 10ML LL (SYRINGE) ×1 IMPLANT
TOWEL GREEN STERILE (TOWEL DISPOSABLE) ×1 IMPLANT
TOWEL GREEN STERILE FF (TOWEL DISPOSABLE) ×1 IMPLANT

## 2023-12-05 NOTE — Anesthesia Postprocedure Evaluation (Signed)
 Anesthesia Post Note  Patient: Tina [name]  Procedure(s) Performed: BREAST LUMPECTOMY WITH RADIOACTIVE SEED LOCALIZATION (Right)     Patient location during evaluation: PACU Anesthesia Type: General Level of consciousness: awake and alert Pain management: pain level controlled Vital Signs Assessment: post-procedure vital signs reviewed and stable Respiratory status: spontaneous breathing, nonlabored ventilation and respiratory function stable Cardiovascular status: blood pressure returned to baseline and stable Postop Assessment: no apparent nausea or vomiting Anesthetic complications: no  No notable events documented.  Last Vitals:  Vitals:   12/05/23 1215 12/05/23 1230  BP: 137/68 138/64  Pulse: (!) 55 (!) 47  Resp: 18 18  Temp:  37.1 C  SpO2: 95% 95%    Last Pain:  Vitals:   12/05/23 1230  TempSrc:   PainSc: 0-No pain                 Hilary Pundt,W. EDMOND

## 2023-12-05 NOTE — Progress Notes (Signed)
 [facility] Summary Report / Loop Recorder

## 2023-12-05 NOTE — H&P (Signed)
 This is a 73 year old female I know from a prior right breast excisional biopsy. She has a history of atrial fibrillation and is on Eliquis . She has a loop recorder in place on the left side right now. She underwent a screening mammogram that shows B density breast tissue. In the outer central right breast she has a 9 mm area of calcifications. She underwent a biopsy. The biopsy clip is displaced about 4 cm medial to the biopsy cavity. 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 She is here with her husband to discuss her options.  Review of Systems: A complete review of systems was obtained from the patient. I have reviewed this information and discussed as appropriate with the patient. See HPI as well for other ROS.  Review of Systems  HENT: Positive for congestion and tinnitus.  Cardiovascular: Positive for palpitations.  Endo/Heme/Allergies: Bruises/bleeds easily.  All other systems reviewed and are negative.   Medical History: Past Medical History:  Diagnosis Date  Arrhythmia  GERD (gastroesophageal reflux disease)  History of cancer  Liver disease   There is no problem list on file for this patient.  Past Surgical History:  Procedure Laterality Date  Bladder Sling  CESAREAN SECTION  HERNIA REPAIR  OPEN EXCISION BREAST LESION Right  SPINE SURGERY  TONSILLECTOMY   Allergies  Allergen Reactions  Codeine Other (See Comments), Nausea and Nausea And Vomiting  Other Reaction(s): GI Intolerance, Other (See Comments) nausea  Erythromycin Other (See Comments)  TACHYCARDIA  Simvastatin Other (See Comments)  Leg pain  Other Other (See Comments)  Difficulty waking from anesthesia  Penicillins Hives and Rash  rash   Current Outpatient Medications on File Prior to Visit  Medication Sig Dispense Refill  acetaminophen  (TYLENOL ) 325 MG tablet Take 325 mg by mouth  denosumab   (PROLIA ) 60 mg/mL inj syringe Inject 60 mg subcutaneously  ELIQUIS  5 mg tablet Take 1 tablet by mouth 2 (two) times daily  EPINEPHrine  (EPIPEN ) 0.3 mg/0.3 mL auto-injector Inject 0.3 mg into the muscle at bedtime as needed  ezetimibe  (ZETIA ) 10 mg tablet Take 0.5 tablets by mouth once daily  famotidine (PEPCID) 20 MG tablet Take 20 mg by mouth  flecainide  (TAMBOCOR ) 50 MG tablet Take 1 tablet by mouth 2 (two) times daily  fluticasone  propionate (FLONASE ) 50 mcg/actuation nasal spray Place 1 spray into one nostril once daily as needed  levocetirizine (XYZAL) 5 MG tablet Take 5 mg by mouth every evening  meclizine  (ANTIVERT ) 25 mg tablet Take 25 mg by mouth  montelukast (SINGULAIR) 10 mg tablet Take 10 mg by mouth once daily  pravastatin  (PRAVACHOL ) 20 MG tablet Take 1 tablet by mouth every evening  propranoloL (INDERAL) 10 MG tablet 1 (ONE) TABLET TWO TIMES DAILY PRN TREMOR   Family History  Problem Relation Age of Onset  Alzheimer's disease Mother  Skin cancer Father  Hyperlipidemia (Elevated cholesterol) Father  Coronary Artery Disease (Blocked arteries around heart) Father    Social History   Tobacco Use  Smoking Status Never  Smokeless Tobacco Never  Marital status: Married  Tobacco Use  Smoking status: Never  Smokeless tobacco: Never  Vaping Use  Vaping status: Never Used  Substance and Sexual Activity  Alcohol use: Not Currently  Drug use: Never   Objective:   Vitals:  11/20/23 1434  BP: 115/71  Pulse: 82  Temp: 36.3 C (97.4 F)  SpO2: 92%  Weight: 65.4 kg (  144 lb 3.2 oz)  Height: 161.9 cm (5' 3.75")  PainSc: 0-No pain  PainLoc: Breast   Body mass index is 24.95 kg/m.  Physical Exam Vitals reviewed.  Constitutional:  Appearance: Normal appearance.  Chest:  Breasts: Right: No inverted nipple, mass or nipple discharge.  Left: No inverted nipple, mass or nipple discharge.  Lymphadenopathy:  Upper Body:  Right upper body: No supraclavicular or axillary  adenopathy.  Left upper body: No supraclavicular or axillary adenopathy.  Neurological:  Mental Status: She is alert.   Assessment and Plan:   Ductal carcinoma in situ (DCIS) of right breast  Right breast radioactive seed guided lumpectomy  We will obtain cardiac clearance prior to beginning due to her history. She will stop her Eliquis  48 hours before surgery.  We discussed the staging and pathophysiology of breast cancer. We discussed all of the different options for treatment for breast cancer including surgery, radiation therapy, and antiestrogen therapy.  We discussed a sentinel lymph node biopsy and I do not think she needs this due to DCIS.  We also discussed DCISion RT testing to see if radiotherapy would be beneficial. Will send this on a core.  We discussed the options for treatment of the breast cancer which included lumpectomy versus a mastectomy. We discussed the performance of the lumpectomy with radioactive seed placement. We discussed a 5-10% chance of a positive margin requiring reexcision in the operating room. We also discussed that she will could be recommended radiation therapy if she undergoes lumpectomy. We discussed mastectomy and the postoperative care for that as well. Mastectomy can be followed by reconstruction. Most mastectomy patients will not need radiation therapy. We discussed that there is no difference in her survival whether she undergoes lumpectomy with radiation therapy or antiestrogen therapy versus a mastectomy. There is also no real difference between her recurrence in the breast.  We discussed the risks of operation including bleeding, infection, possible reoperation. She understands her further therapy will be based on what her stages at the time of her operation.

## 2023-12-05 NOTE — Addendum Note (Signed)
 Addended by: Edra Govern D on: 12/05/2023 05:24 PM   Modules accepted: Orders

## 2023-12-05 NOTE — Discharge Instructions (Signed)
 [facility] Office Phone Number [phone]  POST OP INSTRUCTIONS Take 400 mg of ibuprofen every 8 hours or 650 mg tylenol every 6 hours for next 72 hours then as needed. Use ice several times daily also.  A prescription for pain medication may be given to you upon discharge.  Take your pain medication as prescribed, if needed.  If narcotic pain medicine is not needed, then you may take acetaminophen (Tylenol), naprosyn (Alleve) or ibuprofen (Advil) as needed. Take your usually prescribed medications unless otherwise directed If you need a refill on your pain medication, please contact your pharmacy.  They will contact our office to request authorization.  Prescriptions will not be filled after 5pm or on week-ends. You should eat very light the first 24 hours after surgery, such as soup, crackers, pudding, etc.  Resume your normal diet the day after surgery. Most patients will experience some swelling and bruising in the breast.  Ice packs and a good support bra will help.  Wear the breast binder provided or a sports bra for 72 hours day and night.  After that wear a sports bra during the day until you return to the office. Swelling and bruising can take several days to resolve.  It is common to experience some constipation if taking pain medication after surgery.  Increasing fluid intake and taking a stool softener will usually help or prevent this problem from occurring.  A mild laxative (Milk of Magnesia or Miralax) should be taken according to package directions if there are no bowel movements after 48 hours. I used skin glue on the incision, you may shower in 24 hours.  The glue will flake off over the next 2-3 weeks.  Any sutures or staples will be removed at the office during your follow-up visit. ACTIVITIES:  You may resume regular daily activities (gradually increasing) beginning the next day.  Wearing a good support bra or sports bra minimizes pain and swelling.  You may have  sexual intercourse when it is comfortable. You may drive when you no longer are taking prescription pain medication, you can comfortably wear a seatbelt, and you can safely maneuver your car and apply brakes. RETURN TO WORK:  ______________________________________________________________________________________ Tina [name] should see your doctor in the office for a follow-up appointment approximately two weeks after your surgery.  Your doctor's nurse will typically make your follow-up appointment when she calls you with your pathology report.  Expect your pathology report 3-4 business days after your surgery.  You may call to check if you do not hear from [place] after three days. OTHER INSTRUCTIONS: _______________________________________________________________________________________________ _____________________________________________________________________________________________________________________________________ _____________________________________________________________________________________________________________________________________ _____________________________________________________________________________________________________________________________________  WHEN TO CALL DR Tina [name]: Fever over 101.0 Nausea and/or vomiting. Extreme swelling or bruising. Continued bleeding from incision. Increased pain, redness, or drainage from the incision.  The clinic staff is available to answer your questions during regular business hours.  Please don't hesitate to call and ask to speak to one of the nurses for clinical concerns.  If you have a medical emergency, go to the nearest emergency room or call 911.  A surgeon from [facility] is always on call at the hospital.  For further questions, please visit [email or web address]

## 2023-12-05 NOTE — Op Note (Signed)
 Preoperative diagnosis: Right breast ductal carcinoma in situ Postoperative diagnosis: Same as above Procedure: Right breast radioactive seed guided lumpectomy Surgeon: Dr. Donavan Fuchs Anesthesia: General Specimens: 1.  Right breast tissue marked with paint containing the seed 2.  Additional inferior, medial, posterior margins marked short superior, long lateral, double deep Complications: None Drains: None Sponge and needle count was correct at completion Disposition to recovery in stable addition  Indications: This is a 73 year old female who had a screening mammogram that shows 9 mm of outer central right breast calcifications.  Biopsy showed grade 2 DCIS that is ER/PR positive.  Her biopsy clip was displaced.  We discussed proceeding with a lumpectomy.  Procedure: After informed consent was obtained she was taken to the operating room.  She been off her Eliquis .  She was given antibiotics.  SCDs were placed.  She was placed under general anesthesia without complication.  She was prepped and draped in the standard sterile surgical fashion.  Surgical timeout was then performed.  I located the seed in the central lower outer right breast.  I infiltrated Marcaine  and made a periareolar incision.  I dissected the seed using the neoprobe for guidance.  I then remove the seed and some of the surrounding tissue in order to get a clear margin.  I then did a mammogram which confirmed removal of the seed.  3D imaging the glaucoma was close to several margins so I remove these as above.  Hemostasis was then observed.  I closed the breast tissue with 2-0 Vicryl.  The skin was closed with 3-0 Vicryl and 5-0 Monocryl.  Glue and Steri-Strips were applied.  She tolerated this well was extubated and transferred recovery stable.

## 2023-12-05 NOTE — Interval H&P Note (Signed)
 History and Physical Interval Note:  12/05/2023 10:28 AM  Tina [name]  has presented today for surgery, with the diagnosis of RIGHT BREAST DCIS.  The various methods of treatment have been discussed with the patient and family. After consideration of risks, benefits and other options for treatment, the patient has consented to  Procedure(s) with comments: BREAST LUMPECTOMY WITH RADIOACTIVE SEED LOCALIZATION (Right) - RIGHT BREAST SEED GUIDED LUMPECTOMY as a surgical intervention.  The patient's history has been reviewed, patient examined, no change in status, stable for surgery.  I have reviewed the patient's chart and labs.  Questions were answered to the patient's satisfaction.     Enid Harry

## 2023-12-05 NOTE — Transfer of Care (Signed)
 Immediate Anesthesia Transfer of Care Note  Patient: Tina [name]  Procedure(s) Performed: BREAST LUMPECTOMY WITH RADIOACTIVE SEED LOCALIZATION (Right)  Patient Location: PACU  Anesthesia Type:General  Level of Consciousness: awake, alert , and oriented  Airway & Oxygen Therapy: Patient Spontanous Breathing and Patient connected to face mask oxygen  Post-op Assessment: Report given to RN and Post -op Vital signs reviewed and stable  Post vital signs: Reviewed and stable  Last Vitals:  Vitals Value Taken Time  BP    Temp    Pulse 75 12/05/23 1154  Resp    SpO2 98 % 12/05/23 1154  Vitals shown include unfiled device data.  Last Pain:  Vitals:   12/05/23 0925  TempSrc:   PainSc: 0-No pain         Complications: No notable events documented.

## 2023-12-05 NOTE — Anesthesia Procedure Notes (Signed)
 Procedure Name: LMA Insertion Date/Time: 12/05/2023 11:12 AM  Performed by: Skip Dull, CRNAPre-anesthesia Checklist: Patient identified, Emergency Drugs available, Suction available and Patient being monitored Patient Re-evaluated:Patient Re-evaluated prior to induction Oxygen Delivery Method: Circle System Utilized Preoxygenation: Pre-oxygenation with 100% oxygen Induction Type: IV induction Ventilation: Mask ventilation without difficulty LMA: LMA inserted LMA Size: 4.0 Number of attempts: 1 Airway Equipment and Method: Bite block Placement Confirmation: positive ETCO2 Tube secured with: Tape Dental Injury: Teeth and Oropharynx as per pre-operative assessment

## 2023-12-06 ENCOUNTER — Encounter (HOSPITAL_COMMUNITY): Payer: Self-pay | Admitting: General Surgery

## 2023-12-07 DIAGNOSIS — D0511 Intraductal carcinoma in situ of right breast: Secondary | ICD-10-CM | POA: Diagnosis not present

## 2023-12-07 LAB — SURGICAL PATHOLOGY

## 2023-12-13 ENCOUNTER — Telehealth: Payer: Self-pay | Admitting: Oncology

## 2023-12-13 NOTE — Telephone Encounter (Signed)
 Patient has been scheduled. Aware of appt date and time.   Scheduling Message Entered by Nolia Baumgartner on 12/12/2023 at  5:52 PM Priority: Routine <No visit type provided>  Department: [facility]  Provider:  Scheduling Notes:  I need to see sometime in early June, I have her path report and she should be healing wel

## 2023-12-18 ENCOUNTER — Ambulatory Visit (INDEPENDENT_AMBULATORY_CARE_PROVIDER_SITE_OTHER)

## 2023-12-18 DIAGNOSIS — R262 Difficulty in walking, not elsewhere classified: Secondary | ICD-10-CM | POA: Diagnosis not present

## 2023-12-18 DIAGNOSIS — M2142 Flat foot [pes planus] (acquired), left foot: Secondary | ICD-10-CM

## 2023-12-18 DIAGNOSIS — M2042 Other hammer toe(s) (acquired), left foot: Secondary | ICD-10-CM

## 2023-12-18 DIAGNOSIS — M2141 Flat foot [pes planus] (acquired), right foot: Secondary | ICD-10-CM

## 2023-12-18 DIAGNOSIS — M79672 Pain in left foot: Secondary | ICD-10-CM

## 2023-12-18 NOTE — Progress Notes (Signed)
 [facility]  [address] [place],  [place]  [address] [phone]  Clinic Day:  12/21/23   Referring physician: Clemmie Nest, MD  ASSESSMENT & PLAN:  Assessment: Ductal Carcinoma in Situ of the Right Breast This is a stage 0, TisN0M0, and she has now had her lumpectomy.  I reviewed the final pathology and this confirms a stage 0 and excellent prognosis.  However she has had testing with DCISionRT and this reveals a score of 3.7 which does place her in a mildly elevated risk if she has surgery alone so we will recommend radiotherapy.  We did also discuss chemoprevention. I would prefer raloxifene due to fewer toxicities and the additional benefit of treating her osteoporosis. I discussed the studies that led to the approval for this indication but I also let her know it is optional and her prognosis is great even without it. She can think it over and we can decide when she returns.   Osteoporosis She is current on her bone density scans and I have now reviewed her latest bone density scan from 08/20/23.  Her spine is normal with a T score of -1.0 and 8.6% improvement from the prior study of 08/16/21.  However her femur has a T score of -2.3 and -1.7, which is a 4.9% worsening from the prior study. So she was advised to continue the Prolia  injections every 6 months  Dr. Clemmie found that she had a vitamin D  deficiency and has her on a supplement.    Hepatic Cysts She has 1 especially large cyst measuring over 12 cm of the right lobe of the liver but this appears completely benign as a simple cyst. She has additional small cysts in the liver and right kidney which also appear benign. I reassured her about this but she was concerned about the report of hepatomegaly so we will likely follow this from time to time. She does have a history of hepatitis A at age 62.   Plan: Tina has had a breast biopsy with findings of DCIS and is now back after lumpectomy performed on May, 21st.  she did well with the surgery and feels well today.  Pathology revealed a 0.8 cm focal ductal carcinoma in situ intermediate grade with cribriform pattern but no necrosis.  There was no invasive cancer.  Estrogen and progesterone receptors are known to be positive at 100% and 30% from the biopsy.  No nodes were sampled since this was stage 0 (Tis N0 M0).  Margins are clear.  She has had testing with DCISionRT and this reveals a score of 3.7 which does place her in a mildly elevated risk if she has surgery alone so we will recommend radiotherapy.  I will contact Dr. Jomarie to get her scheduled for that and he is already seen her. I have now reviewed her latest bone density scan from 08/20/23.  Her spine is normal with a T score of -1.0 and 8.6% improvement from the prior study of 08/16/21.  However her femur has a T score of -2.3 and -1.7, which is a 4.9% worsening from the prior study. So she was advised to continue the Prolia .  She has an appointment with the genetics clinic in July.  We reviewed her family history and her father had melanoma as well as prostate cancer treated with seed radiation in his 85s.  A paternal aunt had lung cancer metastatic to brain in her 20s and another paternal aunt had melanoma.  A  paternal first cousin died of prostate cancer in his 92s.  Another paternal aunt had hemochromatosis.  A maternal aunt died of glioblastoma in her 49's and a maternal first cousin and melanoma and breast cancer twice in an early age.  Several family members have Dupuytren's contractures including her mother and cousin.  I will evaluate her for hemochromatosis and check iron, TIBC and ferritin level as well.  She will proceed with her radiation and I will see her back in 2 months.  We will then revisit the discussion about chemoprevention with raloxifene and reviewed the risks and benefits.  At a later time I would repeat her CT of the liver to follow-up on the large hepatic cysts that were seen, although  they appear benign. I discussed the assessment and treatment plan with the patient and her husband.  The patient was provided an opportunity to ask questions and all were answered.  The patient agreed with the plan and demonstrated an understanding of the instructions.  The patient was advised to call back if the symptoms worsen or if the condition fails to improve as anticipated.  I provided 33 minutes of face-to-face time during this this encounter and > 50% was spent counseling as documented under my assessment and plan.   Tina VEAR Cornish, MD [facility] - A DEPT OF [facility] [address] [place] [address] Dept: [phone] Dept Fax: [phone]   I, Tina [name], am acting as scribe for Tina HILARIO Cornish, MD  I have reviewed this report as typed by the medical scribe, and it is complete and accurate.  Tina VEAR Cornish, MD   6/25/20252:51 PM  CHIEF COMPLAINT:  CC:  Ductal carcinoma in situ  Current Treatment: Surgical Lumpectomy   HISTORY OF PRESENT ILLNESS:  Tina [name] is a 73 y.o. female with a history of yearly mammograms who is referred in consultation by Dr. Clorinda Bury for assessment and management of newly diagnosed DCIS. Patient informed me that years ago she had a cyst removed from the right breast and this was found to be benign. She states that this current finding was found on routine mammogram. She then had a unilateral diagnostic mammogram on 10/25/2023, which revealed a 0.9 cm group of indeterminate posterior slightly outer central right  breast calcifications and a biopsy was recommended for further evaluation. Pathology revealed a noninvasive ductal carcinoma in situ that is solid and cribriform type with comedonecrosis and associated calcifications. This was found to be a grade 2, highly ER positive at 100%, and mildly PR positive at 30%, there was necrosis and calcifications present.  She has already had a consultation with our radiation oncologist, Dr. Jomarie, last week and he will make a final recommendation after her lumpectomy pathology. She had an echocardiogram done to be cleared for her lumpectomy and she was found to have an ejection fraction of 55-50% with normal ventricle function and mild mitral valve regurgitation. However there was an incidental finding of a large cystic lesion in the liver. Her cardiologist recommended a CT or ultrasound of the liver for further evaluation. Ultrasound of the liver done on 11/23/2023 revealed hepatic cysts measuring 0.9 x 0.9 x 0.7 cm and 12.7 x 12.2 x 10.8 cm of the right lobe of the liver and simple cysts measuring 1.1 x 1.0 x 0.9cm within the mid pole of the right kidney. We will continue to monitor this either via ultrasound or CT scan since  she did have some hepatomegaly but no other concerning findings. She did have a history of hepatitis A at age 61. Tina informed me that she has had genetic testing done by [facility] and was found to have no genetic mutations. This tested for a limited panel including BRCA 1 & 2. Although she has no family history of breast cancer, both her father and paternal aunt have had melanoma and the aunt expired from this. Her father has also had prostate cancer. I recommend more detailed genetic testing for clarification and she has an appointment with the genetic counselor on July, 11th.    I have reviewed her chart and materials related to her cancer extensively and collaborated history with the patient. Summary of oncologic history is as follows:  Oncology History  Ductal carcinoma in situ (DCIS) of right breast  11/14/2023 Initial Diagnosis   Ductal carcinoma in situ (DCIS) of right breast   11/16/2023 Cancer Staging   Staging form: Breast, AJCC 8th Edition - Pathologic stage from 11/16/2023: Stage 0 (pTis (DCIS), pN0, cM0, G2, ER+, PR+, HER2: Not Assessed) - Signed by Cornelius Tina DEL, MD on  12/12/2023 Histopathologic type: Intraductal carcinoma, noninfiltrating, NOS Stage prefix: Initial diagnosis Method of lymph node assessment: Clinical Nuclear grade: G2 Multigene prognostic tests performed: None Histologic grading system: 3 grade system Laterality: Right Tumor size (mm): 8 Lymph-vascular invasion (LVI): LVI not present (absent)/not identified Diagnostic confirmation: Positive histology PLUS positive immunophenotyping and/or positive genetic studies Specimen type: Excision Staged by: Managing physician Menopausal status: Postmenopausal Stage used in treatment planning: Yes National guidelines used in treatment planning: Yes Type of national guideline used in treatment planning: NCCN   11/28/2023 Cancer Staging   Staging form: Breast, AJCC 8th Edition - Clinical stage from 11/28/2023: Stage 0 (cTis (DCIS), cN0, cM0, G2, ER+, PR+, HER2: Not Assessed) - Signed by Cornelius Tina DEL, MD on 11/28/2023 Histopathologic type: Intraductal carcinoma, noninfiltrating, NOS Stage prefix: Initial diagnosis Method of lymph node assessment: Clinical Nuclear grade: G2 Histologic grading system: 3 grade system Laterality: Right Tumor size (mm): 9 Lymph-vascular invasion (LVI): Presence of LVI unknown/indeterminate Diagnostic confirmation: Positive histology Specimen type: Core Needle Biopsy Staged by: Managing physician Stage used in treatment planning: Yes National guidelines used in treatment planning: Yes Type of national guideline used in treatment planning: NCCN    INTERVAL HISTORY:  Tina has had a breast biopsy with findings of DCIS and is now back after lumpectomy performed on May, 21st. she did well with the surgery and feels well today.  Pathology revealed a 0.8 cm focal ductal carcinoma in situ intermediate grade with cribriform pattern but no necrosis.  There was no invasive cancer.  Estrogen and progesterone receptors are known to be positive at 100% and 30% from the  biopsy.  No nodes were sampled since this was stage 0 (Tis N0 M0).  Margins are clear.  She has had testing with DCISionRT and this reveals a score of 3.7 which does place her in a mildly elevated risk if she has surgery alone so we will recommend radiotherapy.  I will contact Dr. Jomarie to get her scheduled for that and he is already seen her. I have now reviewed her latest bone density scan from 08/20/23.  Her spine is normal with a T score of -1.0 and 8.6% improvement from the prior study of 08/16/21.  However her femur has a T score of -2.3 and -1.7, which is a 4.9% worsening from the prior study. So she was advised to  continue the Prolia .  She has an appointment with the genetics clinic in July.  We reviewed her family history and her father had melanoma as well as prostate cancer treated with seed radiation in his 55s.  A paternal aunt had lung cancer metastatic to brain in her 36s and another paternal aunt had melanoma.  A paternal first cousin died of prostate cancer in his 44s.  Another paternal aunt had hemochromatosis.  A maternal aunt died of glioblastoma in her 38's and a maternal first cousin and melanoma and breast cancer twice in an early age.  Several family members have Dupuytren's contractures including her mother and cousin.  I will evaluate her for hemochromatosis and check iron, TIBC and ferritin level as well.  She will proceed with her radiation and I will see her back in 2 months.  We will then revisit the discussion about chemoprevention with raloxifene and reviewed the risks and benefits.  At a later time I would repeat her CT of the liver to follow-up on the large hepatic cysts that were seen, although they appear benign.  She denies fever, chills, night sweats, or other signs of infection. She denies cardiorespiratory and gastrointestinal issues. She  denies pain. Her appetite is normal and Her weight has been stable.  HISTORY:   Past Medical History:  Diagnosis Date   Abnormal  mammogram of right breast 10/2016   Allergy to pollen 09/26/2018   Anxiety and depression 02/01/2011   Aortic atherosclerosis (HCC)    Arthritis    right Thumb   Atrophic vaginitis 04/28/2020   Cancer (HCC)    Chest pain in adult    Normal MPS 2016   Chronic anticoagulation 09/09/2018   Complication of anesthesia    prolonged sedation   Dental crowns present    Dupuytren contracture    Bilateral hands, mild   GERD (gastroesophageal reflux disease)    Grade I diastolic dysfunction 05/09/2017   on ECHO   Heart murmur    Heart palpitations 10/13/2010   Overview:  Last Assessment & Plan:  Long hx of same - controlled with beta-blocker  ?orthostatic symptoms in AM with low normal BP - pt will monitor to see if symptoms correlate with BP reading No changes recommended today - reassured re: cards recommendation to take extra 1/2 tab prn symptoms    High risk medication use 09/09/2018   History of blood transfusion 1982   History of cardiomegaly 04/14/2017   Noted on CXR   History of hepatitis as a child    hepatitis A at age 6; infectious, per pt.   History of hiatal hernia    History of pleurisy    History of pneumonia    Hypercholesterolemia    Overview:  Last Assessment & Plan:  On statin Well controlled last check, monitor annually The current medical regimen is effective;  continue present plan and medications.    Hyperlipidemia    Irregular heartbeat    since age 48, per pt.   Itchy eyes 09/15/2019   Leg pain, right 03/20/2012   Limited joint range of motion    cervical spine - difficulty looking up for extended period   MR (mitral regurgitation) 05/09/2017   Mild, noted on ECHO   MVP (mitral valve prolapse)    Mild   Myalgia due to statin 03/07/2018   Orthostatic hypotension    Osteoporosis    Osteoporosis, post-menopausal    hx RLE fib fx 03/2012 following fall DEXA 03/13/13 @ [facility]: -2.5  at spine    PAF (paroxysmal atrial fibrillation) (HCC) 09/04/2018   Pleuritic  chest pain 08/24/2014   Pleuritis 04/17/2017   PONV (postoperative nausea and vomiting)    Postural dizziness 02/01/2011   Pulmonary nodule 04/18/2017   5 mm left lower lobe nodule    Right thyroid  nodule 09/26/2018   Seasonal allergies    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   SUI (stress urinary incontinence, female)    SVT (supraventricular tachycardia) (HCC)    Syncope 08/21/2018   Tinnitus of both ears 04/26/2020   TR (tricuspid regurgitation) 05/09/2017   Mild, noted on ECHO   Urinary tract infectious disease 04/28/2020   Wears contact lenses    Wears glasses     Past Surgical History:  Procedure Laterality Date   ANTERIOR CERVICAL DECOMP/DISCECTOMY FUSION  02/03/2002   C5-6   BIOPSY  08/08/2022   Procedure: BIOPSY;  Surgeon: Dianna Specking, MD;  Location: [facility];  Service: Gastroenterology;;   BLADDER SUSPENSION N/A 04/26/2018   Procedure: TRANSVAGINAL TAPE (TVT) PROCEDURE;  Surgeon: Sarrah Browning, MD;  Location: [facility];  Service: Gynecology;  Laterality: N/A;   BREAST BIOPSY Right 11/07/2023   MM RT BREAST BX W LOC DEV 1ST LESION IMAGE BX SPEC STEREO GUIDE 11/07/2023 [facility]   BREAST BIOPSY  12/04/2023   MM RT RADIOACTIVE SEED LOC MAMMO GUIDE 12/04/2023 [facility]   BREAST LUMPECTOMY WITH RADIOACTIVE SEED LOCALIZATION Right 12/05/2023   Procedure: BREAST LUMPECTOMY WITH RADIOACTIVE SEED LOCALIZATION;  Surgeon: Ebbie Cough, MD;  Location: [facility] OR;  Service: General;  Laterality: Right;  RIGHT BREAST SEED GUIDED LUMPECTOMY   CATARACT EXTRACTION     CESAREAN SECTION     x 3   COLONOSCOPY  2013   several   COLONOSCOPY WITH PROPOFOL  N/A 08/08/2022   Procedure: COLONOSCOPY WITH PROPOFOL ;  Surgeon: Dianna Specking, MD;  Location: [facility];  Service: Gastroenterology;  Laterality: N/A;   CYSTOCELE REPAIR N/A 04/26/2018   Procedure: ANTERIOR REPAIR (CYSTOCELE);  Surgeon: Sarrah Browning, MD;   Location: [facility];  Service: Gynecology;  Laterality: N/A;   CYSTOSCOPY N/A 04/26/2018   Procedure: CYSTOSCOPY;  Surgeon: Sarrah Browning, MD;  Location: [facility];  Service: Gynecology;  Laterality: N/A;   INGUINAL HERNIA REPAIR Right    LAPAROSCOPIC NISSEN FUNDOPLICATION  07/07/2003   LOOP RECORDER IMPLANT Left    NISSEN FUNDOPLICATION  08/14/2007   [facility]   RADIOACTIVE SEED GUIDED EXCISIONAL BREAST BIOPSY Right 11/08/2016   Procedure: RADIOACTIVE SEED GUIDED EXCISIONAL RIGHT BREAST BIOPSY;  Surgeon: Cough Ebbie, MD;  Location: [facility];  Service: General;  Laterality: Right;   TONSILLECTOMY  1977   TUBAL LIGATION     UPPER GI ENDOSCOPY     x2   WISDOM TOOTH EXTRACTION      Family History  Problem Relation Age of Onset   Alzheimer's disease Mother    Alzheimer's disease Father    Prostate cancer Father    Hyperlipidemia Father    Heart disease Father    Congestive Heart Failure Father    Heart attack Father    Melanoma Father    Heart attack Maternal Grandmother    Heart attack Maternal Grandfather    Stroke Paternal Grandmother    Heart attack Paternal Grandmother    Heart attack Paternal Grandfather    Melanoma Paternal Aunt     Social History:  reports that she has never smoked. She has never used smokeless tobacco.  She reports that she does not drink alcohol and does not use drugs.The patient is accompanied by husband today.  Allergies:  Allergies  Allergen Reactions   Codeine Nausea And Vomiting, Nausea Only and Other (See Comments)    Other Reaction(s): GI Intolerance,  Other Reaction(s): GI Intolerance, Other (See Comments)    nausea   E.E.S. [Erythromycin] Other (See Comments)    TACHYCARDIA    Zocor [Simvastatin] Other (See Comments)    Leg pain   Other Other (See Comments)    Difficulty waking from anesthesia   Penicillins Rash, Dermatitis and Hives    rash    Current Medications: Current  Outpatient Medications  Medication Sig Dispense Refill   acetaminophen  (TYLENOL ) 325 MG tablet Take 325 mg by mouth every 6 (six) hours as needed for pain or headache.     Ca Phosphate-Cholecalciferol (CALCIUM /VITAMIN D3 GUMMIES PO) Take 2 each by mouth daily. 500 mg / 1000 units per gummy     Coenzyme Q10 (COQ-10) 200 MG CAPS Take 200 mg by mouth in the morning.     denosumab  (PROLIA ) 60 MG/ML SOSY injection Inject 60 mg into the skin every 6 (six) months.     ELIQUIS  5 MG TABS tablet TAKE 1 TABLET BY MOUTH TWICE A DAY 180 tablet 1   ezetimibe  (ZETIA ) 10 MG tablet TAKE 1/2 TABLET BY MOUTH DAILY 45 tablet 2   famotidine (PEPCID) 20 MG tablet Take 20 mg by mouth daily.     FIBER ADULT GUMMIES PO Take 1 each by mouth daily.     flecainide  (TAMBOCOR ) 50 MG tablet Take 1 tablet (50 mg total) by mouth 2 (two) times daily. 180 tablet 2   fluticasone  (FLONASE  ALLERGY RELIEF) 50 MCG/ACT nasal spray Place 1 spray into both nostrils daily.     fluticasone  (FLOVENT  HFA) 44 MCG/ACT inhaler Inhale 1 puff into the lungs 2 (two) times daily.     levocetirizine (XYZAL) 5 MG tablet Take 5 mg by mouth every evening.     meclizine  (ANTIVERT ) 25 MG tablet Take 25 mg by mouth daily as needed for dizziness.     montelukast (SINGULAIR) 10 MG tablet Take 10 mg by mouth at bedtime.     Multiple Vitamins-Minerals (AIRBORNE GUMMIES PO) Take 1 each by mouth daily.     Multiple Vitamins-Minerals (MULTIVITAMIN WOMENS 50+ ADV) TABS Take 1 tablet by mouth daily.     Omega-3 Fatty Acids (MEGARED ADVANCED OMEGA-3) 800 MG CAPS Take 800 mg by mouth daily.     pravastatin  (PRAVACHOL ) 20 MG tablet TAKE 1 TABLET BY MOUTH EVERY DAY IN THE EVENING 90 tablet 2   propranolol (INDERAL) 10 MG tablet Take 10 mg by mouth 2 (two) times daily.     No current facility-administered medications for this visit.    REVIEW OF SYSTEMS:  Review of Systems  Constitutional:  Negative for appetite change, chills, fever and unexpected weight change.   HENT:  Negative.  Negative for lump/mass, mouth sores and sore throat.   Eyes: Negative.   Respiratory: Negative.  Negative for chest tightness, cough, hemoptysis, shortness of breath and wheezing.   Cardiovascular: Negative.  Negative for chest pain, leg swelling and palpitations.  Gastrointestinal: Negative.  Negative for abdominal distention, abdominal pain, blood in stool, constipation, diarrhea, nausea and vomiting.  Endocrine: Negative.   Genitourinary: Negative.  Negative for difficulty urinating, dysuria, frequency and hematuria.   Musculoskeletal:  Negative for arthralgias, back pain, flank pain and gait problem.  Skin: Negative.  Neurological:  Negative for dizziness, extremity weakness, gait problem, headaches, light-headedness, numbness, seizures and speech difficulty.  Hematological: Negative.  Negative for adenopathy. Does not bruise/bleed easily.  Psychiatric/Behavioral: Negative.  Negative for depression and sleep disturbance. The patient is not nervous/anxious.     VITALS:   Blood pressure 125/73, pulse 66, temperature 98.1 F (36.7 C), temperature source Oral, resp. rate 16, height 5' 3 (1.6 m), weight 144 lb 1.6 oz (65.4 kg), SpO2 96%.  Wt Readings from Last 3 Encounters:  12/21/23 144 lb 1.6 oz (65.4 kg)  12/05/23 144 lb (65.3 kg)  11/30/23 144 lb 3.2 oz (65.4 kg)    Body mass index is 25.53 kg/m.  Performance status (ECOG): 0 - Asymptomatic  PHYSICAL EXAM:  Physical Exam Vitals and nursing note reviewed.  Constitutional:      General: She is not in acute distress.    Appearance: Normal appearance. She is normal weight. She is not ill-appearing, toxic-appearing or diaphoretic.  HENT:     Head: Normocephalic and atraumatic.     Right Ear: Tympanic membrane, ear canal and external ear normal. There is no impacted cerumen.     Left Ear: Tympanic membrane, ear canal and external ear normal. There is no impacted cerumen.     Nose: Nose normal. No congestion or  rhinorrhea.     Mouth/Throat:     Mouth: Mucous membranes are moist.     Pharynx: Oropharynx is clear. No oropharyngeal exudate or posterior oropharyngeal erythema.   Eyes:     General: No scleral icterus.       Right eye: No discharge.        Left eye: No discharge.     Extraocular Movements: Extraocular movements intact.     Conjunctiva/sclera: Conjunctivae normal.     Pupils: Pupils are equal, round, and reactive to light.   Neck:     Vascular: No carotid bruit.     Comments: Fait scar of the anterior neck  Cardiovascular:     Rate and Rhythm: Normal rate and regular rhythm.     Pulses: Normal pulses.     Heart sounds: Normal heart sounds. No murmur heard.    No friction rub. No gallop.  Pulmonary:     Effort: Pulmonary effort is normal. No respiratory distress.     Breath sounds: Normal breath sounds. No stridor. No wheezing, rhonchi or rales.  Chest:     Chest wall: No tenderness.     Comments: Well healed scar along the superior areolar complex on the right breast Faint resolving ecchymosis of the lower inner quadrant of the right breast Loop recorder in the upper inner quadrant of the left breast Abdominal:     General: Bowel sounds are normal. There is no distension.     Palpations: Abdomen is soft. There is no hepatomegaly, splenomegaly or mass.     Tenderness: There is no abdominal tenderness. There is no right CVA tenderness, left CVA tenderness, guarding or rebound.     Hernia: No hernia is present.   Musculoskeletal:        General: Normal range of motion.     Cervical back: Normal range of motion and neck supple. No rigidity or tenderness.     Right lower leg: No edema.     Left lower leg: No edema.  Lymphadenopathy:     Cervical: No cervical adenopathy.     Right cervical: No superficial, deep or posterior cervical adenopathy.    Left cervical: No superficial, deep  or posterior cervical adenopathy.     Upper Body:     Right upper body: No supraclavicular,  axillary or pectoral adenopathy.     Left upper body: No supraclavicular, axillary or pectoral adenopathy.   Skin:    General: Skin is warm and dry.     Coloration: Skin is not jaundiced or pale.     Findings: No bruising, erythema, lesion or rash.   Neurological:     General: No focal deficit present.     Mental Status: She is alert and oriented to person, place, and time. Mental status is at baseline.     Cranial Nerves: No cranial nerve deficit.     Sensory: No sensory deficit.     Motor: No weakness.     Coordination: Coordination normal.     Gait: Gait normal.     Deep Tendon Reflexes: Reflexes normal.   Psychiatric:        Mood and Affect: Mood normal.        Behavior: Behavior normal.        Thought Content: Thought content normal.        Judgment: Judgment normal.    LABS:      Latest Ref Rng & Units 12/21/2023    9:39 AM 11/28/2023    3:18 PM 05/05/2022   10:19 AM  CBC  WBC 4.0 - 10.5 K/uL 8.1  5.9  11.0   Hemoglobin 12.0 - 15.0 g/dL 86.4  86.3  83.7   Hematocrit 36.0 - 46.0 % 39.5  39.9  47.7   Platelets 150 - 400 K/uL 224  230  346       Latest Ref Rng & Units 11/28/2023    3:18 PM 05/05/2022   10:19 AM 05/04/2021    1:25 PM  CMP  Glucose 70 - 99 mg/dL 89  86  82   BUN 8 - 23 mg/dL 19  27  21    Creatinine 0.44 - 1.00 mg/dL 9.13  9.11  9.20   Sodium 135 - 145 mmol/L 143  140  142   Potassium 3.5 - 5.1 mmol/L 4.0  3.9  4.4   Chloride 98 - 111 mmol/L 104  102  104   CO2 22 - 32 mmol/L 27  24  23    Calcium  8.9 - 10.3 mg/dL 9.9  9.1  89.9   Total Protein 6.5 - 8.1 g/dL 7.3  6.8  7.4   Total Bilirubin 0.0 - 1.2 mg/dL 0.4  0.7  0.4   Alkaline Phos 38 - 126 U/L 83  78  95   AST 15 - 41 U/L 21  22  18    ALT 0 - 44 U/L 13  38  13   No results found for: CEA1, CEA / No results found for: CEA1, CEA No results found for: PSA1 No results found for: CAN199 No results found for: CAN125  No results found for: TOTALPROTELP, ALBUMINELP, A1GS,  A2GS, BETS, BETA2SER, GAMS, MSPIKE, SPEI Lab Results  Component Value Date   TIBC 367 12/21/2023   FERRITIN 56 12/21/2023   IRONPCTSAT 19 12/21/2023   No results found for: LDH  STUDIES:  Exam: 11/23/2023 Ultrasound of the Right Upper Quadrant Impression: Hepatomegaly  Hepatic cysts measuring 0.9 x 0.9 x 0.7cm and 12.7 x 12.2 x 10.8cm, right lobe of the liver. 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  Simple cyst measuring 1.1 x 1.0 x  0.9cm within mid pole, right kidney No evidence of scites  CUP PACEART REMOTE DEVICE CHECK Result Date: 12/28/2023 ILR summary report received. Battery status OK. Normal device function. No new symptom, tachy, brady, or pause episodes. No new AF episodes. 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 Tissue sampling is recommended   I,Tina M [name],acting as a scribe for Tina VEAR Cornish, MD.,have documented all relevant documentation on the behalf of Tina VEAR Cornish, MD,as directed by  Tina VEAR Cornish, MD while in the presence of Tina VEAR Cornish, MD.

## 2023-12-18 NOTE — Progress Notes (Signed)
 Patient presents today to pick up custom molded foot orthotics, diagnosed with Pes planus, Hammertoes by Dr. Wyn Heater.   Orthotics were dispensed and fit was satisfactory. Reviewed instructions for break-in and wear. Written instructions given to patient.  Patient will follow up as needed.   Britton Cane CPed, CFo, CFm  Patient pd balance of $392 for CFO's

## 2023-12-20 ENCOUNTER — Other Ambulatory Visit: Payer: Self-pay

## 2023-12-20 NOTE — Progress Notes (Signed)
 This information is for the sake of discussion at tumor board only and is not a part of the official treatment plan.

## 2023-12-21 ENCOUNTER — Inpatient Hospital Stay: Attending: Oncology | Admitting: Oncology

## 2023-12-21 ENCOUNTER — Other Ambulatory Visit: Payer: Self-pay | Admitting: Oncology

## 2023-12-21 ENCOUNTER — Inpatient Hospital Stay

## 2023-12-21 ENCOUNTER — Encounter: Payer: Self-pay | Admitting: Oncology

## 2023-12-21 ENCOUNTER — Telehealth: Payer: Self-pay | Admitting: Oncology

## 2023-12-21 VITALS — BP 125/73 | HR 66 | Temp 98.1°F | Resp 16 | Ht 63.0 in | Wt 144.1 lb

## 2023-12-21 DIAGNOSIS — M810 Age-related osteoporosis without current pathological fracture: Secondary | ICD-10-CM | POA: Insufficient documentation

## 2023-12-21 DIAGNOSIS — K7689 Other specified diseases of liver: Secondary | ICD-10-CM | POA: Insufficient documentation

## 2023-12-21 DIAGNOSIS — Z8042 Family history of malignant neoplasm of prostate: Secondary | ICD-10-CM | POA: Insufficient documentation

## 2023-12-21 DIAGNOSIS — Z1721 Progesterone receptor positive status: Secondary | ICD-10-CM | POA: Insufficient documentation

## 2023-12-21 DIAGNOSIS — Z170 Estrogen receptor positive status [ER+]: Secondary | ICD-10-CM | POA: Diagnosis not present

## 2023-12-21 DIAGNOSIS — Z808 Family history of malignant neoplasm of other organs or systems: Secondary | ICD-10-CM | POA: Diagnosis not present

## 2023-12-21 DIAGNOSIS — R160 Hepatomegaly, not elsewhere classified: Secondary | ICD-10-CM

## 2023-12-21 DIAGNOSIS — D0511 Intraductal carcinoma in situ of right breast: Secondary | ICD-10-CM | POA: Diagnosis not present

## 2023-12-21 HISTORY — DX: Hepatomegaly, not elsewhere classified: R16.0

## 2023-12-21 LAB — CBC WITH DIFFERENTIAL (CANCER CENTER ONLY)
Abs Immature Granulocytes: 0.04 10*3/uL (ref 0.00–0.07)
Basophils Absolute: 0.1 10*3/uL (ref 0.0–0.1)
Basophils Relative: 1 %
Eosinophils Absolute: 0.3 10*3/uL (ref 0.0–0.5)
Eosinophils Relative: 3 %
HCT: 39.5 % (ref 36.0–46.0)
Hemoglobin: 13.5 g/dL (ref 12.0–15.0)
Immature Granulocytes: 1 %
Lymphocytes Relative: 16 %
Lymphs Abs: 1.3 10*3/uL (ref 0.7–4.0)
MCH: 30.2 pg (ref 26.0–34.0)
MCHC: 34.2 g/dL (ref 30.0–36.0)
MCV: 88.4 fL (ref 80.0–100.0)
Monocytes Absolute: 0.7 10*3/uL (ref 0.1–1.0)
Monocytes Relative: 9 %
Neutro Abs: 5.7 10*3/uL (ref 1.7–7.7)
Neutrophils Relative %: 70 %
Platelet Count: 224 10*3/uL (ref 150–400)
RBC: 4.47 MIL/uL (ref 3.87–5.11)
RDW: 12.3 % (ref 11.5–15.5)
WBC Count: 8.1 10*3/uL (ref 4.0–10.5)
nRBC: 0 % (ref 0.0–0.2)

## 2023-12-21 LAB — FERRITIN: Ferritin: 56 ng/mL (ref 11–307)

## 2023-12-21 LAB — IRON AND TIBC
Iron: 70 ug/dL (ref 28–170)
Saturation Ratios: 19 % (ref 10.4–31.8)
TIBC: 367 ug/dL (ref 250–450)
UIBC: 297 ug/dL

## 2023-12-21 NOTE — Telephone Encounter (Signed)
 Patient has been scheduled for follow-up visit per 12/21/23 LOS.  Pt aware of scheduled appt details.

## 2023-12-24 DIAGNOSIS — Z170 Estrogen receptor positive status [ER+]: Secondary | ICD-10-CM | POA: Diagnosis not present

## 2023-12-24 DIAGNOSIS — D0511 Intraductal carcinoma in situ of right breast: Secondary | ICD-10-CM | POA: Diagnosis not present

## 2023-12-26 LAB — HEMOCHROMATOSIS DNA-PCR(C282Y,H63D)

## 2023-12-27 ENCOUNTER — Ambulatory Visit (INDEPENDENT_AMBULATORY_CARE_PROVIDER_SITE_OTHER)

## 2023-12-27 DIAGNOSIS — R55 Syncope and collapse: Secondary | ICD-10-CM | POA: Diagnosis not present

## 2023-12-31 ENCOUNTER — Encounter

## 2024-01-01 ENCOUNTER — Ambulatory Visit: Payer: Self-pay | Admitting: Cardiology

## 2024-01-01 ENCOUNTER — Ambulatory Visit
Admission: RE | Admit: 2024-01-01 | Discharge: 2024-01-01 | Disposition: A | Discharge status: Home / Self Care | Source: Ambulatory Visit | Attending: Radiation Oncology | Admitting: Radiation Oncology

## 2024-01-01 DIAGNOSIS — Z510 Encounter for antineoplastic radiation therapy: Secondary | ICD-10-CM | POA: Insufficient documentation

## 2024-01-01 DIAGNOSIS — Z170 Estrogen receptor positive status [ER+]: Secondary | ICD-10-CM | POA: Diagnosis not present

## 2024-01-01 DIAGNOSIS — D0511 Intraductal carcinoma in situ of right breast: Secondary | ICD-10-CM | POA: Insufficient documentation

## 2024-01-03 DIAGNOSIS — Z170 Estrogen receptor positive status [ER+]: Secondary | ICD-10-CM | POA: Diagnosis not present

## 2024-01-03 DIAGNOSIS — Z510 Encounter for antineoplastic radiation therapy: Secondary | ICD-10-CM | POA: Diagnosis not present

## 2024-01-03 DIAGNOSIS — D0511 Intraductal carcinoma in situ of right breast: Secondary | ICD-10-CM | POA: Diagnosis not present

## 2024-01-09 ENCOUNTER — Ambulatory Visit
Admission: RE | Admit: 2024-01-09 | Discharge: 2024-01-09 | Disposition: A | Discharge status: Home / Self Care | Source: Ambulatory Visit | Attending: Radiation Oncology | Admitting: Radiation Oncology

## 2024-01-09 ENCOUNTER — Other Ambulatory Visit: Payer: Self-pay

## 2024-01-09 DIAGNOSIS — D0511 Intraductal carcinoma in situ of right breast: Secondary | ICD-10-CM | POA: Diagnosis not present

## 2024-01-09 DIAGNOSIS — Z170 Estrogen receptor positive status [ER+]: Secondary | ICD-10-CM | POA: Diagnosis not present

## 2024-01-09 DIAGNOSIS — Z510 Encounter for antineoplastic radiation therapy: Secondary | ICD-10-CM | POA: Diagnosis not present

## 2024-01-09 LAB — RAD ONC ARIA SESSION SUMMARY
Course Elapsed Days: 0
Plan Fractions Treated to Date: 1
Plan Prescribed Dose Per Fraction: 2.66 Gy
Plan Total Fractions Prescribed: 16
Plan Total Prescribed Dose: 42.56 Gy
Reference Point Dosage Given to Date: 2.66 Gy
Reference Point Session Dosage Given: 2.66 Gy
Session Number: 1

## 2024-01-10 ENCOUNTER — Ambulatory Visit
Admission: RE | Admit: 2024-01-10 | Discharge: 2024-01-10 | Disposition: A | Discharge status: Home / Self Care | Source: Ambulatory Visit | Attending: Radiation Oncology | Admitting: Radiation Oncology

## 2024-01-10 ENCOUNTER — Other Ambulatory Visit: Payer: Self-pay

## 2024-01-10 DIAGNOSIS — D0511 Intraductal carcinoma in situ of right breast: Secondary | ICD-10-CM | POA: Diagnosis not present

## 2024-01-10 DIAGNOSIS — Z510 Encounter for antineoplastic radiation therapy: Secondary | ICD-10-CM | POA: Diagnosis not present

## 2024-01-10 LAB — RAD ONC ARIA SESSION SUMMARY
Course Elapsed Days: 1
Plan Fractions Treated to Date: 2
Plan Prescribed Dose Per Fraction: 2.66 Gy
Plan Total Fractions Prescribed: 16
Plan Total Prescribed Dose: 42.56 Gy
Reference Point Dosage Given to Date: 5.32 Gy
Reference Point Session Dosage Given: 2.66 Gy
Session Number: 2

## 2024-01-11 ENCOUNTER — Other Ambulatory Visit: Payer: Self-pay

## 2024-01-11 ENCOUNTER — Ambulatory Visit
Admission: RE | Admit: 2024-01-11 | Discharge: 2024-01-11 | Disposition: A | Discharge status: Home / Self Care | Source: Ambulatory Visit | Attending: Radiation Oncology

## 2024-01-11 DIAGNOSIS — D0511 Intraductal carcinoma in situ of right breast: Secondary | ICD-10-CM | POA: Diagnosis not present

## 2024-01-11 DIAGNOSIS — Z510 Encounter for antineoplastic radiation therapy: Secondary | ICD-10-CM | POA: Diagnosis not present

## 2024-01-11 LAB — RAD ONC ARIA SESSION SUMMARY
Course Elapsed Days: 2
Plan Fractions Treated to Date: 3
Plan Prescribed Dose Per Fraction: 2.66 Gy
Plan Total Fractions Prescribed: 16
Plan Total Prescribed Dose: 42.56 Gy
Reference Point Dosage Given to Date: 7.98 Gy
Reference Point Session Dosage Given: 2.66 Gy
Session Number: 3

## 2024-01-14 ENCOUNTER — Ambulatory Visit
Admission: RE | Admit: 2024-01-14 | Discharge: 2024-01-14 | Disposition: A | Discharge status: Home / Self Care | Source: Ambulatory Visit | Attending: Radiation Oncology

## 2024-01-14 ENCOUNTER — Other Ambulatory Visit: Payer: Self-pay

## 2024-01-14 DIAGNOSIS — Z510 Encounter for antineoplastic radiation therapy: Secondary | ICD-10-CM | POA: Diagnosis not present

## 2024-01-14 DIAGNOSIS — D0511 Intraductal carcinoma in situ of right breast: Secondary | ICD-10-CM | POA: Diagnosis not present

## 2024-01-14 LAB — RAD ONC ARIA SESSION SUMMARY
Course Elapsed Days: 5
Plan Fractions Treated to Date: 4
Plan Prescribed Dose Per Fraction: 2.66 Gy
Plan Total Fractions Prescribed: 16
Plan Total Prescribed Dose: 42.56 Gy
Reference Point Dosage Given to Date: 10.64 Gy
Reference Point Session Dosage Given: 2.66 Gy
Session Number: 4

## 2024-01-14 NOTE — Progress Notes (Signed)
 [facility] Summary Report / Loop Recorder

## 2024-01-15 ENCOUNTER — Ambulatory Visit
Admission: RE | Admit: 2024-01-15 | Discharge: 2024-01-15 | Disposition: A | Discharge status: Home / Self Care | Source: Ambulatory Visit | Attending: Radiation Oncology | Admitting: Radiation Oncology

## 2024-01-15 ENCOUNTER — Other Ambulatory Visit: Payer: Self-pay

## 2024-01-15 DIAGNOSIS — D0511 Intraductal carcinoma in situ of right breast: Secondary | ICD-10-CM | POA: Diagnosis not present

## 2024-01-15 DIAGNOSIS — Z510 Encounter for antineoplastic radiation therapy: Secondary | ICD-10-CM | POA: Insufficient documentation

## 2024-01-15 LAB — RAD ONC ARIA SESSION SUMMARY
Course Elapsed Days: 6
Plan Fractions Treated to Date: 5
Plan Prescribed Dose Per Fraction: 2.66 Gy
Plan Total Fractions Prescribed: 16
Plan Total Prescribed Dose: 42.56 Gy
Reference Point Dosage Given to Date: 13.3 Gy
Reference Point Session Dosage Given: 2.66 Gy
Session Number: 5

## 2024-01-16 ENCOUNTER — Ambulatory Visit
Admission: RE | Admit: 2024-01-16 | Discharge: 2024-01-16 | Disposition: A | Discharge status: Home / Self Care | Source: Ambulatory Visit | Attending: Radiation Oncology | Admitting: Radiation Oncology

## 2024-01-16 ENCOUNTER — Other Ambulatory Visit: Payer: Self-pay

## 2024-01-16 DIAGNOSIS — Z510 Encounter for antineoplastic radiation therapy: Secondary | ICD-10-CM | POA: Diagnosis not present

## 2024-01-16 DIAGNOSIS — D0511 Intraductal carcinoma in situ of right breast: Secondary | ICD-10-CM | POA: Diagnosis not present

## 2024-01-16 DIAGNOSIS — Z170 Estrogen receptor positive status [ER+]: Secondary | ICD-10-CM | POA: Diagnosis not present

## 2024-01-16 LAB — RAD ONC ARIA SESSION SUMMARY
Course Elapsed Days: 7
Plan Fractions Treated to Date: 6
Plan Prescribed Dose Per Fraction: 2.66 Gy
Plan Total Fractions Prescribed: 16
Plan Total Prescribed Dose: 42.56 Gy
Reference Point Dosage Given to Date: 15.96 Gy
Reference Point Session Dosage Given: 2.66 Gy
Session Number: 6

## 2024-01-17 ENCOUNTER — Other Ambulatory Visit: Payer: Self-pay

## 2024-01-17 ENCOUNTER — Ambulatory Visit
Admission: RE | Admit: 2024-01-17 | Discharge: 2024-01-17 | Disposition: A | Discharge status: Home / Self Care | Source: Ambulatory Visit | Attending: Radiation Oncology | Admitting: Radiation Oncology

## 2024-01-17 DIAGNOSIS — Z510 Encounter for antineoplastic radiation therapy: Secondary | ICD-10-CM | POA: Diagnosis not present

## 2024-01-17 DIAGNOSIS — D0511 Intraductal carcinoma in situ of right breast: Secondary | ICD-10-CM | POA: Diagnosis not present

## 2024-01-17 LAB — RAD ONC ARIA SESSION SUMMARY
Course Elapsed Days: 8
Plan Fractions Treated to Date: 7
Plan Prescribed Dose Per Fraction: 2.66 Gy
Plan Total Fractions Prescribed: 16
Plan Total Prescribed Dose: 42.56 Gy
Reference Point Dosage Given to Date: 18.62 Gy
Reference Point Session Dosage Given: 2.66 Gy
Session Number: 7

## 2024-01-21 ENCOUNTER — Ambulatory Visit
Admission: RE | Admit: 2024-01-21 | Discharge: 2024-01-21 | Disposition: A | Discharge status: Home / Self Care | Source: Ambulatory Visit | Attending: Radiation Oncology | Admitting: Radiation Oncology

## 2024-01-21 ENCOUNTER — Other Ambulatory Visit: Payer: Self-pay

## 2024-01-21 DIAGNOSIS — D0511 Intraductal carcinoma in situ of right breast: Secondary | ICD-10-CM | POA: Diagnosis not present

## 2024-01-21 DIAGNOSIS — Z510 Encounter for antineoplastic radiation therapy: Secondary | ICD-10-CM | POA: Diagnosis not present

## 2024-01-21 LAB — RAD ONC ARIA SESSION SUMMARY
Course Elapsed Days: 12
Plan Fractions Treated to Date: 8
Plan Prescribed Dose Per Fraction: 2.66 Gy
Plan Total Fractions Prescribed: 16
Plan Total Prescribed Dose: 42.56 Gy
Reference Point Dosage Given to Date: 21.28 Gy
Reference Point Session Dosage Given: 2.66 Gy
Session Number: 8

## 2024-01-22 ENCOUNTER — Other Ambulatory Visit: Payer: Self-pay

## 2024-01-22 ENCOUNTER — Ambulatory Visit
Admission: RE | Admit: 2024-01-22 | Discharge: 2024-01-22 | Disposition: A | Discharge status: Home / Self Care | Source: Ambulatory Visit | Attending: Radiation Oncology

## 2024-01-22 ENCOUNTER — Ambulatory Visit
Admission: RE | Admit: 2024-01-22 | Discharge: 2024-01-22 | Disposition: A | Discharge status: Home / Self Care | Source: Ambulatory Visit | Attending: Radiation Oncology | Admitting: Radiation Oncology

## 2024-01-22 DIAGNOSIS — Z170 Estrogen receptor positive status [ER+]: Secondary | ICD-10-CM | POA: Diagnosis not present

## 2024-01-22 DIAGNOSIS — Z510 Encounter for antineoplastic radiation therapy: Secondary | ICD-10-CM | POA: Diagnosis not present

## 2024-01-22 DIAGNOSIS — D0511 Intraductal carcinoma in situ of right breast: Secondary | ICD-10-CM | POA: Diagnosis not present

## 2024-01-22 LAB — RAD ONC ARIA SESSION SUMMARY
Course Elapsed Days: 13
Plan Fractions Treated to Date: 9
Plan Prescribed Dose Per Fraction: 2.66 Gy
Plan Total Fractions Prescribed: 16
Plan Total Prescribed Dose: 42.56 Gy
Reference Point Dosage Given to Date: 23.94 Gy
Reference Point Session Dosage Given: 2.66 Gy
Session Number: 9

## 2024-01-22 NOTE — Progress Notes (Signed)
 Face to face contact with pt and husband in Rad Onc. Pt is here for radiation tx and follow up. Pt reports that she is doing well with her treatment and had her ninth treatment today.

## 2024-01-23 ENCOUNTER — Other Ambulatory Visit: Payer: Self-pay

## 2024-01-23 ENCOUNTER — Ambulatory Visit
Admission: RE | Admit: 2024-01-23 | Discharge: 2024-01-23 | Disposition: A | Discharge status: Home / Self Care | Source: Ambulatory Visit | Attending: Radiation Oncology

## 2024-01-23 DIAGNOSIS — Z510 Encounter for antineoplastic radiation therapy: Secondary | ICD-10-CM | POA: Diagnosis not present

## 2024-01-23 DIAGNOSIS — D0511 Intraductal carcinoma in situ of right breast: Secondary | ICD-10-CM | POA: Diagnosis not present

## 2024-01-23 LAB — RAD ONC ARIA SESSION SUMMARY
Course Elapsed Days: 14
Plan Fractions Treated to Date: 10
Plan Prescribed Dose Per Fraction: 2.66 Gy
Plan Total Fractions Prescribed: 16
Plan Total Prescribed Dose: 42.56 Gy
Reference Point Dosage Given to Date: 26.6 Gy
Reference Point Session Dosage Given: 2.66 Gy
Session Number: 10

## 2024-01-24 ENCOUNTER — Other Ambulatory Visit: Payer: Self-pay

## 2024-01-24 ENCOUNTER — Ambulatory Visit
Admission: RE | Admit: 2024-01-24 | Discharge: 2024-01-24 | Disposition: A | Discharge status: Home / Self Care | Source: Ambulatory Visit | Attending: Radiation Oncology | Admitting: Radiation Oncology

## 2024-01-24 DIAGNOSIS — Z510 Encounter for antineoplastic radiation therapy: Secondary | ICD-10-CM | POA: Diagnosis not present

## 2024-01-24 DIAGNOSIS — D0511 Intraductal carcinoma in situ of right breast: Secondary | ICD-10-CM | POA: Diagnosis not present

## 2024-01-24 DIAGNOSIS — Z170 Estrogen receptor positive status [ER+]: Secondary | ICD-10-CM | POA: Diagnosis not present

## 2024-01-24 LAB — RAD ONC ARIA SESSION SUMMARY
Course Elapsed Days: 15
Plan Fractions Treated to Date: 11
Plan Prescribed Dose Per Fraction: 2.66 Gy
Plan Total Fractions Prescribed: 16
Plan Total Prescribed Dose: 42.56 Gy
Reference Point Dosage Given to Date: 29.26 Gy
Reference Point Session Dosage Given: 2.66 Gy
Session Number: 11

## 2024-01-25 ENCOUNTER — Inpatient Hospital Stay: Attending: Oncology

## 2024-01-25 ENCOUNTER — Inpatient Hospital Stay (HOSPITAL_BASED_OUTPATIENT_CLINIC_OR_DEPARTMENT_OTHER): Attending: Oncology | Admitting: Genetic Counselor

## 2024-01-25 ENCOUNTER — Other Ambulatory Visit: Payer: Self-pay

## 2024-01-25 ENCOUNTER — Ambulatory Visit
Admission: RE | Admit: 2024-01-25 | Discharge: 2024-01-25 | Disposition: A | Discharge status: Home / Self Care | Source: Ambulatory Visit | Attending: Radiation Oncology

## 2024-01-25 ENCOUNTER — Encounter: Payer: Self-pay | Admitting: Genetic Counselor

## 2024-01-25 DIAGNOSIS — Z8042 Family history of malignant neoplasm of prostate: Secondary | ICD-10-CM

## 2024-01-25 DIAGNOSIS — Z510 Encounter for antineoplastic radiation therapy: Secondary | ICD-10-CM | POA: Diagnosis not present

## 2024-01-25 DIAGNOSIS — Z170 Estrogen receptor positive status [ER+]: Secondary | ICD-10-CM | POA: Diagnosis not present

## 2024-01-25 DIAGNOSIS — D0511 Intraductal carcinoma in situ of right breast: Secondary | ICD-10-CM

## 2024-01-25 DIAGNOSIS — Z1721 Progesterone receptor positive status: Secondary | ICD-10-CM | POA: Diagnosis not present

## 2024-01-25 LAB — RAD ONC ARIA SESSION SUMMARY
Course Elapsed Days: 16
Plan Fractions Treated to Date: 12
Plan Prescribed Dose Per Fraction: 2.66 Gy
Plan Total Fractions Prescribed: 16
Plan Total Prescribed Dose: 42.56 Gy
Reference Point Dosage Given to Date: 31.92 Gy
Reference Point Session Dosage Given: 2.66 Gy
Session Number: 12

## 2024-01-25 LAB — GENETIC SCREENING ORDER

## 2024-01-25 NOTE — Progress Notes (Addendum)
 REFERRING PROVIDER: Jomarie Agent, MD [address] [place],  [place] [address]  PRIMARY PROVIDER:  Clemmie Nest, MD  PRIMARY REASON FOR VISIT:  1. Ductal carcinoma in situ (DCIS) of right breast   2. Family history of malignant neoplasm of prostate      HISTORY OF PRESENT ILLNESS:   Tina [name], a 73 y.o. female, was seen for a [place] cancer genetics consultation at the request of Dr. Jomarie due to a personal and family history of cancer.  Tina [name] presents to clinic today to discuss the possibility of a hereditary predisposition to cancer, to discuss genetic testing, and to further clarify her future cancer risks, as well as potential cancer risks for family members.   In 2025, at the age of 28, Tina [name] was diagnosed with right DCIS. Lumpectomy 11/26/23, has completed 9 radiation treatments.   Tina [name] had negative screening through the [facility] program. [employer] One Population Screen is a screening test that analyzes 11 genes related to hereditary breast and ovarian cancer syndrome (HBOC), Lynch syndrome, and familial hypercholesterolemia. These include APOB, BRCA1, BRCA2, EPCAM, LDLR, LDLRAP1, PCSK9, PMS2 (excluding exons 11-15), MLH1, MSH2, MSH6. This test reports pathogenic and likely pathogenic variants, but does not report on variants of unknown significance. The absence of any additional pathogenic variants is reassuring, but does not rule out a hereditary condition. There are other variants and genes associated with heart disease, hereditary cancer and other inherited conditions that were not included in this test.  CANCER HISTORY:  Oncology History  Ductal carcinoma in situ (DCIS) of right breast  11/14/2023 Initial Diagnosis   Ductal carcinoma in situ (DCIS) of right breast   11/16/2023 Cancer Staging   Staging form: Breast, AJCC 8th Edition - Pathologic stage from 11/16/2023: Stage 0 (pTis (DCIS), pN0, cM0, G2, ER+, PR+, HER2: Not Assessed) - Signed by Cornelius Wanda DEL, MD on 12/12/2023 Histopathologic type: Intraductal carcinoma, noninfiltrating, NOS Stage prefix: Initial diagnosis Method of lymph node assessment: Clinical Nuclear grade: G2 Multigene prognostic tests performed: None Histologic grading system: 3 grade system Laterality: Right Tumor size (mm): 8 Lymph-vascular invasion (LVI): LVI not present (absent)/not identified Diagnostic confirmation: Positive histology PLUS positive immunophenotyping and/or positive genetic studies Specimen type: Excision Staged by: Managing physician Menopausal status: Postmenopausal Stage used in treatment planning: Yes National guidelines used in treatment planning: Yes Type of national guideline used in treatment planning: NCCN   11/28/2023 Cancer Staging   Staging form: Breast, AJCC 8th Edition - Clinical stage from 11/28/2023: Stage 0 (cTis (DCIS), cN0, cM0, G2, ER+, PR+, HER2: Not Assessed) - Signed by Cornelius Wanda DEL, MD on 11/28/2023 Histopathologic type: Intraductal carcinoma, noninfiltrating, NOS Stage prefix: Initial diagnosis Method of lymph node assessment: Clinical Nuclear grade: G2 Histologic grading system: 3 grade system Laterality: Right Tumor size (mm): 9 Lymph-vascular invasion (LVI): Presence of LVI unknown/indeterminate Diagnostic confirmation: Positive histology Specimen type: Core Needle Biopsy Staged by: Managing physician Stage used in treatment planning: Yes National guidelines used in treatment planning: Yes Type of national guideline used in treatment planning: NCCN      RISK FACTORS:  Menarche was at age 70.  First live birth at age 75.  OCP use for approximately 20+ years.  Ovaries intact: yes.  Uterus intact: yes.  Menopausal status: postmenopausal.  HRT use: 0 years. Colonoscopy: yes; 07/2022 one hyperplastic polyp. Other colonoscopies normal. Mammogram within the last year: yes. Annual dermatolgoy   Past Medical History:  Diagnosis Date    Abnormal mammogram  of right breast 10/2016   Allergy to pollen 09/26/2018   Anxiety and depression 02/01/2011   Aortic atherosclerosis (HCC)    Arthritis    right Thumb   Atrophic vaginitis 04/28/2020   Cancer (HCC)    Chest pain in adult    Normal MPS 2016   Chronic anticoagulation 09/09/2018   Complication of anesthesia    prolonged sedation   Dental crowns present    Dupuytren contracture    Bilateral hands, mild   GERD (gastroesophageal reflux disease)    Grade I diastolic dysfunction 05/09/2017   on ECHO   Heart murmur    Heart palpitations 10/13/2010   Overview:  Last Assessment & Plan:  Long hx of same - controlled with beta-blocker  ?orthostatic symptoms in AM with low normal BP - pt will monitor to see if symptoms correlate with BP reading No changes recommended today - reassured re: cards recommendation to take extra 1/2 tab prn symptoms    High risk medication use 09/09/2018   History of blood transfusion 1982   History of cardiomegaly 04/14/2017   Noted on CXR   History of hepatitis as a child    hepatitis A at age 34; infectious, per pt.   History of hiatal hernia    History of pleurisy    History of pneumonia    Hypercholesterolemia    Overview:  Last Assessment & Plan:  On statin Well controlled last check, monitor annually The current medical regimen is effective;  continue present plan and medications.    Hyperlipidemia    Irregular heartbeat    since age 21, per pt.   Itchy eyes 09/15/2019   Leg pain, right 03/20/2012   Limited joint range of motion    cervical spine - difficulty looking up for extended period   MR (mitral regurgitation) 05/09/2017   Mild, noted on ECHO   MVP (mitral valve prolapse)    Mild   Myalgia due to statin 03/07/2018   Orthostatic hypotension    Osteoporosis    Osteoporosis, post-menopausal    hx RLE fib fx 03/2012 following fall DEXA 03/13/13 @ [facility]: -2.5 at spine    PAF (paroxysmal atrial fibrillation) (HCC) 09/04/2018    Pleuritic chest pain 08/24/2014   Pleuritis 04/17/2017   PONV (postoperative nausea and vomiting)    Postural dizziness 02/01/2011   Pulmonary nodule 04/18/2017   5 mm left lower lobe nodule    Right thyroid  nodule 09/26/2018   Seasonal allergies    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   SUI (stress urinary incontinence, female)    SVT (supraventricular tachycardia) (HCC)    Syncope 08/21/2018   Tinnitus of both ears 04/26/2020   TR (tricuspid regurgitation) 05/09/2017   Mild, noted on ECHO   Urinary tract infectious disease 04/28/2020   Wears contact lenses    Wears glasses     Past Surgical History:  Procedure Laterality Date   ANTERIOR CERVICAL DECOMP/DISCECTOMY FUSION  02/03/2002   C5-6   BIOPSY  08/08/2022   Procedure: BIOPSY;  Surgeon: Dianna Specking, MD;  Location: [facility];  Service: Gastroenterology;;   BLADDER SUSPENSION N/A 04/26/2018   Procedure: TRANSVAGINAL TAPE (TVT) PROCEDURE;  Surgeon: Sarrah Browning, MD;  Location: [facility];  Service: Gynecology;  Laterality: N/A;   BREAST BIOPSY Right 11/07/2023   MM RT BREAST BX W LOC DEV 1ST LESION IMAGE BX SPEC STEREO GUIDE 11/07/2023 [facility]   BREAST BIOPSY  12/04/2023   MM RT RADIOACTIVE  SEED LOC MAMMO GUIDE 12/04/2023 [facility]   BREAST LUMPECTOMY WITH RADIOACTIVE SEED LOCALIZATION Right 12/05/2023   Procedure: BREAST LUMPECTOMY WITH RADIOACTIVE SEED LOCALIZATION;  Surgeon: Ebbie Cough, MD;  Location: [facility] OR;  Service: General;  Laterality: Right;  RIGHT BREAST SEED GUIDED LUMPECTOMY   CATARACT EXTRACTION     CESAREAN SECTION     x 3   COLONOSCOPY  2013   several   COLONOSCOPY WITH PROPOFOL  N/A 08/08/2022   Procedure: COLONOSCOPY WITH PROPOFOL ;  Surgeon: Dianna Specking, MD;  Location: [facility];  Service: Gastroenterology;  Laterality: N/A;   CYSTOCELE REPAIR N/A 04/26/2018   Procedure: ANTERIOR REPAIR (CYSTOCELE);  Surgeon: Sarrah Browning, MD;  Location: [facility];  Service: Gynecology;  Laterality: N/A;   CYSTOSCOPY N/A 04/26/2018   Procedure: CYSTOSCOPY;  Surgeon: Sarrah Browning, MD;  Location: [facility];  Service: Gynecology;  Laterality: N/A;   INGUINAL HERNIA REPAIR Right    LAPAROSCOPIC NISSEN FUNDOPLICATION  07/07/2003   LOOP RECORDER IMPLANT Left    NISSEN FUNDOPLICATION  08/14/2007   redo   RADIOACTIVE SEED GUIDED EXCISIONAL BREAST BIOPSY Right 11/08/2016   Procedure: RADIOACTIVE SEED GUIDED EXCISIONAL RIGHT BREAST BIOPSY;  Surgeon: Cough Ebbie, MD;  Location: [facility];  Service: General;  Laterality: Right;   TONSILLECTOMY  1977   TUBAL LIGATION     UPPER GI ENDOSCOPY     x2   WISDOM TOOTH EXTRACTION      Social History   Socioeconomic History   Marital status: Married    Spouse name: Trenten   Number of children: 2   Years of education: 12+6   Highest education level: Master's degree (e.g., MA, MS, MEng, MEd, MSW, MBA)  Occupational History   Occupation: Retired  Tobacco Use   Smoking status: Never   Smokeless tobacco: Never  Vaping Use   Vaping status: Never Used  Substance and Sexual Activity   Alcohol use: Never   Drug use: Never   Sexual activity: Not on file  Other Topics Concern   Not on file  Social History Narrative   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 Married, lives with spouse -   Social Drivers of Corporate investment banker Strain: Not on file  Food Insecurity: No Food Insecurity (11/21/2023)   Hunger Vital Sign    Worried About Running Out of Food in the Last Year: Never true    Ran Out of Food in the Last Year: Never true  Transportation Needs: No Transportation Needs (11/21/2023)   PRAPARE - Administrator, Civil Service (Medical): No    Lack of Transportation (Non-Medical): No  Physical Activity: Not on file  Stress: No Stress Concern Present (11/21/2023)   [employer] of Occupational Health - Occupational Stress Questionnaire    Feeling of Stress : Only a little  Recent Concern: Stress - Stress Concern Present (11/21/2023)   [employer] of Occupational Health - Occupational Stress Questionnaire    Feeling of Stress : To some extent  Social Connections: Not on file     FAMILY HISTORY:  We obtained a detailed, 4-generation family history.  Significant diagnoses are listed below: Family History  Problem Relation Age of Onset   Alzheimer's disease Mother    Alzheimer's disease Father    Prostate cancer Father 24 - 25   Hyperlipidemia Father    Heart disease Father    Congestive Heart Failure Father    Heart attack Father  Melanoma Father 57 - 41       ear/face   Brain cancer Maternal Aunt 60       glioblastoma   Melanoma Paternal Aunt 44       metastatic to lung, brain   Melanoma Paternal Aunt    Hemachromatosis Paternal Aunt    Heart disease Paternal Uncle    Heart attack Maternal Grandmother    Heart attack Maternal Grandfather    Stroke Paternal Grandmother    Heart attack Paternal Grandmother    Heart attack Paternal Grandfather     Tina [name] is unaware of previous family history of genetic testing for hereditary cancer risks. Patient's maternal ancestors are of N European descent, and paternal ancestors are of N European descent. There is no reported Ashkenazi Jewish ancestry.     GENETIC COUNSELING ASSESSMENT: Tina [name] is a 73 y.o. female with a personal and family history of cancer which is somewhat suggestive of a hereditary predisposition to cancer given her personal history of breast cancer and family history of a close relative with metastatic prostate cancer. We, therefore, discussed and recommended the following at today's visit.   DISCUSSION: We discussed that 5 - 10% of cancer is hereditary, with most cases of breast associated with BRCA1/2.  There are other genes that can be associated with hereditary breast  cancer syndromes.  We discussed that testing is beneficial for several reasons including knowing how to follow individuals after completing their treatment, identifying whether potential treatment options would be beneficial, and understanding if other family members could be at risk for cancer and allowing them to undergo genetic testing.   We reviewed the characteristics, features and inheritance patterns of hereditary cancer syndromes. We also discussed genetic testing, including the appropriate family members to test, the process of testing, insurance coverage and turn-around-time for results. We discussed the implications of a negative, positive, carrier and/or variant of uncertain significant result. We recommended Tina [name] pursue genetic testing for a panel that includes genes associated with breast, prostate, and melanoma skin cancer.   Tina [name]  was offered a common hereditary cancer panel (48 genes) and an expanded pan-cancer panel (70 genes). Tina [name] was informed of the benefits and limitations of each panel, including that expanded pan-cancer panels contain genes that do not have clear management guidelines at this point in time.  We also discussed that as the number of genes included on a panel increases, the chances of variants of uncertain significance increases. After considering the benefits and limitations of each gene panel, Tina [name] elected to have [facility] CancerNext-Expanded +RNAinsight panel. The CancerNext-Expanded gene panel offered by [facility] and includes sequencing, rearrangement, and RNA analysis for the following 77 genes: AIP, ALK, APC, ATM, AXIN2, BAP1, BARD1, BMPR1A, BRCA1, BRCA2, BRIP1, CDC73, CDH1, CDK4, CDKN1B, CDKN2A, CEBPA, CHEK2, CTNNA1, DDX41, DICER1, EGFR, EPCAM, ETV6, FH, FLCN, GATA2, GREM1, HOXB13, KIT, LZTR1, MAX, MBD4, MEN1, MET, MITF, MLH1, MSH2, MSH3, MSH6, MUTYH, NF1, NF2, NTHL1, PALB2, PDGFRA, PHOX2B, PMS2, POLD1, POLE, POT1, PRKAR1A, PTCH1, PTEN, RAD51C,  RAD51D, RB1, RET, RPS20, RUNX1, SDHA, SDHAF2, SDHB, SDHC, SDHD, SMAD4, SMARCA4, SMARCB1, SMARCE1, STK11, SUFU, TMEM127, TP53, TSC1, TSC2, VHL, WT1.   Based on Tina [name]'s personal and family history of cancer, she meets medical criteria for genetic testing. Despite that she meets criteria, she may still have an out of pocket cost. We discussed that if her out of pocket cost for testing is over $100, the laboratory should contact them to discuss self-pay prices, patient  pay assistance programs, if applicable, and other billing options.  PLAN: After considering the risks, benefits, and limitations, Tina [name] provided informed consent to pursue genetic testing and the blood sample was sent to [facility] for analysis of the CancerNext-Expanded +RNAinsight test. Results should be available within approximately 2-3 weeks' time, at which point they will be disclosed by telephone to Tina [name], as will any additional recommendations warranted by these results. Tina [name] will receive a summary of her genetic counseling visit and a copy of her results once available. This information will also be available in Epic.   Lastly, we encouraged Tina [name] to remain in contact with cancer genetics annually so that we can continuously update the family history and inform her of any changes in cancer genetics and testing that may be of benefit for this family.   Tina [name] questions were answered to her satisfaction today. Our contact information was provided should additional questions or concerns arise. Thank you for the referral and allowing us  to share in the care of your patient.   Burnard Ogren, MS, [facility] Licensed, Retail [email or web address] phone: [phone]   60 minutes were spent on the date of the encounter in service to the patient including preparation, face-to-face consultation, documentation and care coordination.  The patient brought her husband.  Drs. Gudena and/or  Lanny were available to discuss this case as needed.   _______________________________________________________________________ For Office Staff:  Number of people involved in session: 2 Was an Intern/ student involved with case: no

## 2024-01-28 ENCOUNTER — Ambulatory Visit

## 2024-01-28 ENCOUNTER — Other Ambulatory Visit: Payer: Self-pay

## 2024-01-28 ENCOUNTER — Ambulatory Visit
Admission: RE | Admit: 2024-01-28 | Discharge: 2024-01-28 | Disposition: A | Discharge status: Home / Self Care | Source: Ambulatory Visit | Attending: Radiation Oncology

## 2024-01-28 DIAGNOSIS — D0511 Intraductal carcinoma in situ of right breast: Secondary | ICD-10-CM | POA: Diagnosis not present

## 2024-01-28 DIAGNOSIS — R55 Syncope and collapse: Secondary | ICD-10-CM | POA: Diagnosis not present

## 2024-01-28 DIAGNOSIS — Z510 Encounter for antineoplastic radiation therapy: Secondary | ICD-10-CM | POA: Diagnosis not present

## 2024-01-28 LAB — RAD ONC ARIA SESSION SUMMARY
Course Elapsed Days: 19
Plan Fractions Treated to Date: 13
Plan Prescribed Dose Per Fraction: 2.66 Gy
Plan Total Fractions Prescribed: 16
Plan Total Prescribed Dose: 42.56 Gy
Reference Point Dosage Given to Date: 34.58 Gy
Reference Point Session Dosage Given: 2.66 Gy
Session Number: 13

## 2024-01-28 LAB — CUP PACEART REMOTE DEVICE CHECK
Date Time Interrogation Session: 20250713230123
Implantable Pulse Generator Implant Date: 20210412

## 2024-01-29 ENCOUNTER — Ambulatory Visit
Admission: RE | Admit: 2024-01-29 | Discharge: 2024-01-29 | Disposition: A | Discharge status: Home / Self Care | Source: Ambulatory Visit | Attending: Radiation Oncology | Admitting: Radiation Oncology

## 2024-01-29 ENCOUNTER — Other Ambulatory Visit: Payer: Self-pay

## 2024-01-29 ENCOUNTER — Ambulatory Visit
Admission: RE | Admit: 2024-01-29 | Discharge: 2024-01-29 | Disposition: A | Discharge status: Home / Self Care | Source: Ambulatory Visit | Attending: Radiation Oncology

## 2024-01-29 ENCOUNTER — Ambulatory Visit: Payer: Self-pay | Admitting: Cardiology

## 2024-01-29 DIAGNOSIS — D0511 Intraductal carcinoma in situ of right breast: Secondary | ICD-10-CM | POA: Diagnosis not present

## 2024-01-29 DIAGNOSIS — Z510 Encounter for antineoplastic radiation therapy: Secondary | ICD-10-CM | POA: Diagnosis not present

## 2024-01-29 LAB — RAD ONC ARIA SESSION SUMMARY
Course Elapsed Days: 20
Plan Fractions Treated to Date: 14
Plan Prescribed Dose Per Fraction: 2.66 Gy
Plan Total Fractions Prescribed: 16
Plan Total Prescribed Dose: 42.56 Gy
Reference Point Dosage Given to Date: 37.24 Gy
Reference Point Session Dosage Given: 2.66 Gy
Session Number: 14

## 2024-01-30 ENCOUNTER — Ambulatory Visit
Admission: RE | Admit: 2024-01-30 | Discharge: 2024-01-30 | Disposition: A | Discharge status: Home / Self Care | Source: Ambulatory Visit | Attending: Radiation Oncology

## 2024-01-30 ENCOUNTER — Other Ambulatory Visit: Payer: Self-pay

## 2024-01-30 ENCOUNTER — Other Ambulatory Visit: Payer: Self-pay | Admitting: Cardiology

## 2024-01-30 DIAGNOSIS — Z510 Encounter for antineoplastic radiation therapy: Secondary | ICD-10-CM | POA: Diagnosis not present

## 2024-01-30 DIAGNOSIS — I480 Paroxysmal atrial fibrillation: Secondary | ICD-10-CM

## 2024-01-30 DIAGNOSIS — D0511 Intraductal carcinoma in situ of right breast: Secondary | ICD-10-CM | POA: Diagnosis not present

## 2024-01-30 LAB — RAD ONC ARIA SESSION SUMMARY
Course Elapsed Days: 21
Plan Fractions Treated to Date: 15
Plan Prescribed Dose Per Fraction: 2.66 Gy
Plan Total Fractions Prescribed: 16
Plan Total Prescribed Dose: 42.56 Gy
Reference Point Dosage Given to Date: 39.9 Gy
Reference Point Session Dosage Given: 2.66 Gy
Session Number: 15

## 2024-01-30 NOTE — Telephone Encounter (Signed)
 Eliquis  5mg  refill request received. Patient is 73 years old, weight-65.4kg, Crea-0.86 on 11/28/23, Diagnosis-Afib, and last seen by Delon Hoover on 10/04/23. Dose is appropriate based on dosing criteria. Will send in refill to requested pharmacy.

## 2024-01-31 ENCOUNTER — Ambulatory Visit
Admission: RE | Admit: 2024-01-31 | Discharge: 2024-01-31 | Disposition: A | Discharge status: Home / Self Care | Source: Ambulatory Visit | Attending: Radiation Oncology

## 2024-01-31 ENCOUNTER — Other Ambulatory Visit: Payer: Self-pay

## 2024-01-31 DIAGNOSIS — Z510 Encounter for antineoplastic radiation therapy: Secondary | ICD-10-CM | POA: Diagnosis not present

## 2024-01-31 DIAGNOSIS — Z170 Estrogen receptor positive status [ER+]: Secondary | ICD-10-CM | POA: Diagnosis not present

## 2024-01-31 DIAGNOSIS — D0511 Intraductal carcinoma in situ of right breast: Secondary | ICD-10-CM | POA: Diagnosis not present

## 2024-01-31 LAB — RAD ONC ARIA SESSION SUMMARY
Course Elapsed Days: 22
Plan Fractions Treated to Date: 16
Plan Prescribed Dose Per Fraction: 2.66 Gy
Plan Total Fractions Prescribed: 16
Plan Total Prescribed Dose: 42.56 Gy
Reference Point Dosage Given to Date: 42.56 Gy
Reference Point Session Dosage Given: 2.66 Gy
Session Number: 16

## 2024-02-01 ENCOUNTER — Ambulatory Visit
Admission: RE | Admit: 2024-02-01 | Discharge: 2024-02-01 | Disposition: A | Discharge status: Home / Self Care | Source: Ambulatory Visit | Attending: Radiation Oncology | Admitting: Radiation Oncology

## 2024-02-01 ENCOUNTER — Other Ambulatory Visit: Payer: Self-pay

## 2024-02-01 DIAGNOSIS — Z510 Encounter for antineoplastic radiation therapy: Secondary | ICD-10-CM | POA: Diagnosis not present

## 2024-02-01 DIAGNOSIS — D0511 Intraductal carcinoma in situ of right breast: Secondary | ICD-10-CM | POA: Diagnosis not present

## 2024-02-01 LAB — RAD ONC ARIA SESSION SUMMARY
Course Elapsed Days: 23
Plan Fractions Treated to Date: 1
Plan Prescribed Dose Per Fraction: 2 Gy
Plan Total Fractions Prescribed: 5
Plan Total Prescribed Dose: 10 Gy
Reference Point Dosage Given to Date: 2 Gy
Reference Point Session Dosage Given: 2 Gy
Session Number: 17

## 2024-02-04 ENCOUNTER — Encounter

## 2024-02-04 ENCOUNTER — Ambulatory Visit
Admission: RE | Admit: 2024-02-04 | Discharge: 2024-02-04 | Disposition: A | Discharge status: Home / Self Care | Source: Ambulatory Visit | Attending: Radiation Oncology | Admitting: Radiation Oncology

## 2024-02-04 ENCOUNTER — Other Ambulatory Visit: Payer: Self-pay

## 2024-02-04 DIAGNOSIS — Z510 Encounter for antineoplastic radiation therapy: Secondary | ICD-10-CM | POA: Diagnosis not present

## 2024-02-04 DIAGNOSIS — D0511 Intraductal carcinoma in situ of right breast: Secondary | ICD-10-CM | POA: Diagnosis not present

## 2024-02-04 LAB — RAD ONC ARIA SESSION SUMMARY
Course Elapsed Days: 26
Plan Fractions Treated to Date: 2
Plan Prescribed Dose Per Fraction: 2 Gy
Plan Total Fractions Prescribed: 5
Plan Total Prescribed Dose: 10 Gy
Reference Point Dosage Given to Date: 4 Gy
Reference Point Session Dosage Given: 2 Gy
Session Number: 18

## 2024-02-05 ENCOUNTER — Ambulatory Visit
Admission: RE | Admit: 2024-02-05 | Discharge: 2024-02-05 | Discharge status: Home / Self Care | Source: Ambulatory Visit | Attending: Radiation Oncology

## 2024-02-05 ENCOUNTER — Ambulatory Visit: Admitting: Radiation Oncology

## 2024-02-05 ENCOUNTER — Ambulatory Visit: Admitting: Cardiology

## 2024-02-05 ENCOUNTER — Ambulatory Visit

## 2024-02-05 ENCOUNTER — Other Ambulatory Visit: Payer: Self-pay

## 2024-02-05 ENCOUNTER — Telehealth: Payer: Self-pay | Admitting: Licensed Clinical Social Worker

## 2024-02-05 VITALS — BP 120/78 | HR 69 | Ht 63.0 in | Wt 147.6 lb

## 2024-02-05 DIAGNOSIS — I340 Nonrheumatic mitral (valve) insufficiency: Secondary | ICD-10-CM | POA: Diagnosis not present

## 2024-02-05 DIAGNOSIS — I480 Paroxysmal atrial fibrillation: Secondary | ICD-10-CM

## 2024-02-05 DIAGNOSIS — Z79899 Other long term (current) drug therapy: Secondary | ICD-10-CM

## 2024-02-05 DIAGNOSIS — Z5181 Encounter for therapeutic drug level monitoring: Secondary | ICD-10-CM | POA: Diagnosis not present

## 2024-02-05 DIAGNOSIS — R011 Cardiac murmur, unspecified: Secondary | ICD-10-CM | POA: Insufficient documentation

## 2024-02-05 DIAGNOSIS — D0511 Intraductal carcinoma in situ of right breast: Secondary | ICD-10-CM | POA: Diagnosis not present

## 2024-02-05 DIAGNOSIS — C801 Malignant (primary) neoplasm, unspecified: Secondary | ICD-10-CM | POA: Insufficient documentation

## 2024-02-05 DIAGNOSIS — E7800 Pure hypercholesterolemia, unspecified: Secondary | ICD-10-CM | POA: Diagnosis not present

## 2024-02-05 DIAGNOSIS — Z510 Encounter for antineoplastic radiation therapy: Secondary | ICD-10-CM | POA: Diagnosis not present

## 2024-02-05 LAB — RAD ONC ARIA SESSION SUMMARY
Course Elapsed Days: 27
Plan Fractions Treated to Date: 3
Plan Prescribed Dose Per Fraction: 2 Gy
Plan Total Fractions Prescribed: 5
Plan Total Prescribed Dose: 10 Gy
Reference Point Dosage Given to Date: 6 Gy
Reference Point Session Dosage Given: 2 Gy
Session Number: 19

## 2024-02-05 NOTE — Telephone Encounter (Signed)
 I contacted Ms. Wind to discuss her genetic testing results. No pathogenic variants were identified in the 77 genes analyzed. Detailed clinic note to follow.   The test report has been scanned into EPIC and is located under the Molecular Pathology section of the Results Review tab.  A portion of the result report is included below for reference.      Dena Cary, MS, [facility] Genetic Counselor [email or web address] Phone: [phone]

## 2024-02-05 NOTE — Progress Notes (Addendum)
 Cardiology Consultation:    Date:  02/05/2024   ID:  Tina [name], DOB 03/26/1951, MRN [id number]  PCP:  Clemmie Nest, MD  Cardiologist:  Alean SAUNDERS Adna Nofziger, MD   Referring MD: Clemmie Nest, MD   No chief complaint on file.    ASSESSMENT AND PLAN:   Tina [name] (goes by Tina [name]) 73 year old pleasant woman with history of paroxysmal atrial fibrillation on rhythm control with flecainide  [since February 2020] and anticoagulation with Eliquis  , SVT, implantable loop recorder in place, mitral regurgitation, chronic RBBB noted since 2020, dyslipidemia, COPD, recent echocardiogram 11-16-2023 notes LVEF 50 to 55%, grade 1 diastolic dysfunction, moderately dilated left atrium, mild MR, mild aortic insufficiency.  Incidental finding of large cystlike lesion in the liver and follow-up CT imaging recommended and subsequently evaluated with a right upper quadrant ultrasound at PCPs office 11/23/2023 reporting hepatic cysts and a simple right renal cyst.  Following up in this regard with her PCP.  Also recently diagnosed with right breast cancer s/p lumpectomy and undergoing radiation sessions with plan to initiate hormonal therapy, follows up with Dr. Cornelius.  Here for follow-up visit Problem List Items Addressed This Visit     PAF (paroxysmal atrial fibrillation) (HCC) - Primary   CHA2DS2-VASc score 2. Remains on anticoagulation with Eliquis  5 mg twice daily. Tolerating well. Relatively asymptomatic. Remains on rhythm control with flecainide  50 mg twice daily and low-dose propranolol 10 mg twice daily.  She has baseline RBBB morphology. Given her age and recent echocardiogram findings with LVEF 50 to 55%, we will recommend monitoring for any drug related effects with exertion.  Will recommend treadmill EKG stress test to monitor safety of flecainide .  Will recommend visit with electrophysiologist Dr. Inocencio to discuss long-term rhythm control options.       Relevant Orders   EKG  12-Lead (Completed)   EXERCISE TOLERANCE TEST (ETT)   MR (mitral regurgitation)   Noted on echocardiogram 11/16/2023 and comparable to prior echocardiogram from 2018. No significant mitral valve prolapse appreciated on the recent echocardiogram Follow-up repeat echocardiogram tentatively in 18 to 24 months      Hypercholesterolemia   Last lipid panel 08/20/2023 total cholesterol 153, HDL 42, triglycerides 161. Hemoglobin A1c 5.6.  Continue with pravastatin  20 mg once daily and Zetia  10 mg once daily.       Other Visit Diagnoses       Encounter for monitoring flecainide  therapy       Relevant Orders   EXERCISE TOLERANCE TEST (ETT)      Return to clinic tentatively in 6 months.   History of Present Illness:    Tina [name] is a 73 y.o. female who is being seen today for follow-up visit. PCP is Clemmie Nest, MD. Last visit at our office was with Nest Hoover, NP-C 10-04-2023. Follows up with Dr. Monetta and Dr. Inocencio  Has history of paroxysmal atrial fibrillation on rhythm control with flecainide  and anticoagulation with Eliquis  , SVT, implantable loop recorder in place, mitral regurgitation, chronic RBBB noted since 2020, dyslipidemia, COPD, recent echocardiogram 11-16-2023 notes LVEF 50 to 55%, grade 1 diastolic dysfunction, moderately dilated left atrium, mild MR, mild aortic insufficiency.  Incidental finding of large cystlike lesion in the liver and follow-up CT imaging recommended and subsequently evaluated with a right upper quadrant ultrasound at PCPs office 11/23/2023 reporting hepatic cysts and a simple right renal cyst.  Following up in this regard with her PCP.  EKG in the clinic today shows sinus rhythm heart rate 69/min,  PR interval normal 176 Tina, QRS duration 146 Tina RBBB morphology.  In comparison prior EKG from October 04, 2023 similar.  Loop recorder check 01/27/2024 noted no new A-fib episodes.  No new tacky or bradycardia or pause episodes.  Normal device  function.   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   Dupuytren contracture    Bilateral hands, mild   GERD (gastroesophageal reflux disease)    Gestational diabetes    history of     Globus sensation 10/20/2020   Grade I diastolic dysfunction 05/09/2017   on ECHO   Heart murmur    Heart palpitations 10/13/2010   Overview:  Last Assessment & Plan:  Long hx of same - controlled with beta-blocker  ?orthostatic symptoms in AM with low normal BP - pt will monitor to see if symptoms correlate with BP reading No changes recommended today - reassured re: cards recommendation to take extra 1/2 tab prn symptoms    Heartburn 10/20/2020   Hepatitis    age 23 infectious     Hepatomegaly 12/21/2023   High risk medication use 09/09/2018   History of blood transfusion 1982   History of cardiomegaly 04/14/2017   Noted on CXR   History of hepatitis as a child    hepatitis A at age 59; infectious, per pt.   History of hiatal hernia    History of pleurisy    History of pneumonia    Hypercholesterolemia    Overview:  Last Assessment & Plan:  On statin Well controlled last check, monitor annually The current medical regimen is effective;  continue present plan and medications.    Hyperlipidemia    Irregular heartbeat    since age 40, per pt.   Itchy eyes 09/15/2019   Limited joint range of motion    cervical spine - difficulty looking up for extended period   Low back  pain 08/03/2021   MR (mitral regurgitation) 05/09/2017   Mild, noted on ECHO   MVP (mitral valve prolapse)    Mild   Myalgia due to statin 03/07/2018   Orthostatic hypotension    Osteoporosis    Osteoporosis, post-menopausal    hx RLE fib fx 03/2012 following fall DEXA 03/13/13 @ [facility]: -2.5 at spine    PAF (paroxysmal atrial fibrillation) (HCC) 09/04/2018   Pain in left foot 01/10/2023   Pleuritic chest pain 08/24/2014   Pleuritis 04/17/2017   PONV (postoperative nausea and vomiting)    Postural dizziness 02/01/2011   Pruritus of vulva 04/19/2021   Pulmonary nodule 04/18/2017   5 mm left lower lobe nodule    Right thyroid  nodule 09/26/2018   Seasonal allergies    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   SUI (stress urinary incontinence, female)    SVT (supraventricular tachycardia) (HCC)    Syncope 08/21/2018   Tinnitus of both ears 04/26/2020   TR (  tricuspid regurgitation) 05/09/2017   Mild, noted on ECHO   Urinary tract infectious disease 04/28/2020   Vestibular neuritis 11/06/2022   Wears glasses     Past Surgical History:  Procedure Laterality Date   ANTERIOR CERVICAL DECOMP/DISCECTOMY FUSION  02/03/2002   C5-6   BIOPSY  08/08/2022   Procedure: BIOPSY;  Surgeon: Dianna Specking, MD;  Location: [facility];  Service: Gastroenterology;;   BLADDER SUSPENSION N/A 04/26/2018   Procedure: TRANSVAGINAL TAPE (TVT) PROCEDURE;  Surgeon: Sarrah Browning, MD;  Location: [facility];  Service: Gynecology;  Laterality: N/A;   BREAST BIOPSY Right 11/07/2023   MM RT BREAST BX W LOC DEV 1ST LESION IMAGE BX SPEC STEREO GUIDE 11/07/2023 [facility]   BREAST BIOPSY  12/04/2023   MM RT RADIOACTIVE SEED LOC MAMMO GUIDE 12/04/2023 [facility]   BREAST LUMPECTOMY WITH RADIOACTIVE SEED LOCALIZATION Right 12/05/2023   Procedure: BREAST LUMPECTOMY WITH RADIOACTIVE SEED LOCALIZATION;  Surgeon: Ebbie Cough, MD;  Location[facility];  Service:  General;  Laterality: Right;  RIGHT BREAST SEED GUIDED LUMPECTOMY   CATARACT EXTRACTION     CESAREAN SECTION     x 3   COLONOSCOPY  2013   several   COLONOSCOPY WITH PROPOFOL  N/A 08/08/2022   Procedure: COLONOSCOPY WITH PROPOFOL ;  Surgeon: Dianna Specking, MD;  Location: [facility];  Service: Gastroenterology;  Laterality: N/A;   CYSTOCELE REPAIR N/A 04/26/2018   Procedure: ANTERIOR REPAIR (CYSTOCELE);  Surgeon: Sarrah Browning, MD;  Location: [facility];  Service: Gynecology;  Laterality: N/A;   CYSTOSCOPY N/A 04/26/2018   Procedure: CYSTOSCOPY;  Surgeon: Sarrah Browning, MD;  Location: [facility];  Service: Gynecology;  Laterality: N/A;   INGUINAL HERNIA REPAIR Right    LAPAROSCOPIC NISSEN FUNDOPLICATION  07/07/2003   LOOP RECORDER IMPLANT Left    NISSEN FUNDOPLICATION  08/14/2007   redo   RADIOACTIVE SEED GUIDED EXCISIONAL BREAST BIOPSY Right 11/08/2016   Procedure: RADIOACTIVE SEED GUIDED EXCISIONAL RIGHT BREAST BIOPSY;  Surgeon: Cough Ebbie, MD;  Location: [facility];  Service: General;  Laterality: Right;   TONSILLECTOMY  1977   TUBAL LIGATION     UPPER GI ENDOSCOPY     x2   WISDOM TOOTH EXTRACTION      Current Medications: Current Meds  Medication Sig   acetaminophen  (TYLENOL ) 325 MG tablet Take 325 mg by mouth every 6 (six) hours as needed for pain or headache.   Ca Phosphate-Cholecalciferol (CALCIUM /VITAMIN D3 GUMMIES PO) Take 2 each by mouth daily. 500 mg / 1000 units per gummy   Coenzyme Q10 (COQ-10) 200 MG CAPS Take 200 mg by mouth in the morning.   denosumab  (PROLIA ) 60 MG/ML SOSY injection Inject 60 mg into the skin every 6 (six) months.   ELIQUIS  5 MG TABS tablet TAKE 1 TABLET BY MOUTH TWICE A DAY   ezetimibe  (ZETIA ) 10 MG tablet TAKE 1/2 TABLET BY MOUTH DAILY   famotidine (PEPCID) 20 MG tablet Take 20 mg by mouth daily.   FIBER ADULT GUMMIES PO Take 1 each by mouth daily.   flecainide  (TAMBOCOR ) 50 MG  tablet Take 1 tablet (50 mg total) by mouth 2 (two) times daily.   fluticasone  (FLONASE  ALLERGY RELIEF) 50 MCG/ACT nasal spray Place 1 spray into both nostrils daily.   fluticasone  (FLOVENT  HFA) 44 MCG/ACT inhaler Inhale 1 puff into the lungs 2 (two) times daily.   levocetirizine (XYZAL) 5 MG tablet Take 5 mg by mouth every evening.   meclizine  (ANTIVERT ) 25 MG tablet  Take 25 mg by mouth daily as needed for dizziness.   montelukast (SINGULAIR) 10 MG tablet Take 10 mg by mouth at bedtime.   Omega-3 Fatty Acids (MEGARED ADVANCED OMEGA-3) 800 MG CAPS Take 800 mg by mouth daily.   pravastatin  (PRAVACHOL ) 20 MG tablet TAKE 1 TABLET BY MOUTH EVERY DAY IN THE EVENING   propranolol (INDERAL) 10 MG tablet Take 10 mg by mouth 2 (two) times daily.     Allergies:   Codeine, E.e.s. 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 Married, lives with spouse -   Social Drivers of Corporate investment banker Strain: Not on file  Food Insecurity: No Food Insecurity (11/21/2023)   Hunger Vital Sign    Worried About Running Out of Food in the Last Year: Never true    Ran Out of Food in the Last Year: Never true  Transportation Needs: No Transportation Needs (11/21/2023)   PRAPARE - Administrator, Civil Service (Medical): No    Lack of Transportation (Non-Medical): No  Physical  Activity: Not on file  Stress: No Stress Concern Present (11/21/2023)   [employer] of Occupational Health - Occupational Stress Questionnaire    Feeling of Stress : Only a little  Recent Concern: Stress - Stress Concern Present (11/21/2023)   [employer] of Occupational Health - Occupational Stress Questionnaire    Feeling of Stress : To some extent  Social Connections: Not on file     Family History: The patient's family history includes Alzheimer's disease in her father and mother; Brain cancer (age of onset: 20) in her maternal aunt; Congestive Heart Failure in her father; Heart attack in her father, maternal grandfather, maternal grandmother, paternal grandfather, and paternal grandmother; Heart disease in her father and paternal uncle; Hemachromatosis in her paternal aunt; Hyperlipidemia in her father; Melanoma in her paternal aunt; Melanoma (age of onset: 61) in her paternal aunt; Melanoma (age of onset: 37 - 32) in her father; Prostate cancer (age of onset: 8 - 68) in her father; Stroke in her paternal grandmother. ROS:   Please see the history of present illness.    All 14 point review of systems negative except as described per history of present illness.  EKGs/Labs/Other Studies Reviewed:    The following studies were reviewed today:   EKG:  EKG Interpretation Date/Time:  Tuesday February 05 2024 15:21:34 EDT Ventricular Rate:  69 PR Interval:  176 QRS Duration:  146 QT Interval:  426 QTC Calculation: 456 R Axis:   28  Text Interpretation: Normal sinus rhythm Right bundle branch block Abnormal ECG When compared with ECG of 04-Oct-2023 14:41, T wave inversion less evident in Inferior leads T wave inversion no longer evident in Lateral leads Confirmed by Liborio Hai reddy [phone]) on 02/05/2024 3:57:49 PM    Recent  Labs: 11/28/2023: ALT 13; BUN 19; Creatinine 0.86; Potassium 4.0; Sodium 143 12/21/2023: Hemoglobin 13.5; Platelet Count 224  Recent Lipid Panel     Component Value Date/Time   CHOL 164 05/04/2021 1325   TRIG 190 (H) 05/04/2021 1325   HDL 44 05/04/2021 1325   CHOLHDL 3.7 05/04/2021 1325   CHOLHDL 5 03/04/2015 0824   VLDL 20.8 03/04/2015 0824   LDLCALC 88 05/04/2021 1325    Physical Exam:    VS:  BP 120/78   Pulse 69   Ht 5' 3 (1.6 m)   Wt 147 lb 9.6 oz (67 kg)   SpO2 96%   BMI 26.15 kg/m     Wt Readings from Last 3 Encounters:  02/05/24 147 lb 9.6 oz (67 kg)  12/21/23 144 lb 1.6 oz (65.4 kg)  12/05/23 144 lb (65.3 kg)     GENERAL:  Well nourished, well developed in no acute distress NECK: No JVD; No carotid bruits CARDIAC: RRR, S1 and S2 present, no murmurs, no rubs, no gallops CHEST:  Clear to auscultation without rales, wheezing or rhonchi  Extremities: No pitting pedal edema. Pulses bilaterally symmetric with radial 2+ and dorsalis pedis 2+ NEUROLOGIC:  Alert and oriented x 3  Medication Adjustments/Labs and Tests Ordered: Current medicines are reviewed at length with the patient today.  Concerns regarding medicines are outlined above.  Orders Placed This Encounter  Procedures   EXERCISE TOLERANCE TEST (ETT)   EKG 12-Lead   No orders of the defined types were placed in this encounter.   Signed, Alean jess Kobus, MD, MPH, [facility]. 02/05/2024 4:21 PM    [facility]

## 2024-02-05 NOTE — Assessment & Plan Note (Signed)
 CHA2DS2-VASc score 2. Remains on anticoagulation with Eliquis  5 mg twice daily. Tolerating well. Relatively asymptomatic. Remains on rhythm control with flecainide  50 mg twice daily and low-dose propranolol 10 mg twice daily.  She has baseline RBBB morphology. Given her age and recent echocardiogram findings with LVEF 50 to 55%, we will recommend monitoring for any drug related effects with exertion.  Will recommend treadmill EKG stress test to monitor safety of flecainide .  Will recommend visit with electrophysiologist Dr. Inocencio to discuss long-term rhythm control options.

## 2024-02-05 NOTE — Telephone Encounter (Signed)
 Left voicemail for patient that genetic test results are back, requested call back.    Dena Cary, MS, [facility] Genetic Counselor [email or web address] Phone: [phone]

## 2024-02-05 NOTE — Assessment & Plan Note (Signed)
 Last lipid panel 08/20/2023 total cholesterol 153, HDL 42, triglycerides 161. Hemoglobin A1c 5.6.  Continue with pravastatin  20 mg once daily and Zetia  10 mg once daily.

## 2024-02-05 NOTE — Assessment & Plan Note (Addendum)
 Noted on echocardiogram 11/16/2023 and comparable to prior echocardiogram from 2018. No significant mitral valve prolapse appreciated on the recent echocardiogram Follow-up repeat echocardiogram tentatively in 18 to 24 months

## 2024-02-05 NOTE — Patient Instructions (Signed)
 Medication Instructions:  Your physician recommends that you continue on your current medications as directed. Please refer to the Current Medication list given to you today.  *If you need a refill on your cardiac medications before your next appointment, please call your pharmacy*   Lab Work: None ordered If you have labs (blood work) drawn today and your tests are completely normal, you will receive your results only by: MyChart Message (if you have MyChart) OR A paper copy in the mail If you have any lab test that is abnormal or we need to change your treatment, we will call you to review the results.   Testing/Procedures:   Please arrive 15 minutes prior to your appointment time for registration and insurance purposes.  The test will take approximately 45 minutes to complete.  How to prepare for your Exercise Stress Test: Do bring a list of your current medications with you.  If not listed below, you may take your medications as normal. Do not take propranolol (inderal, Innopran) for 24 hours prior to the test.  Bring the medication to your appointment as you may be required to take it once the test is complete. Do wear comfortable clothes (no dresses or overalls) and walking shoes, tennis shoes preferred (no heels or open toed shoes are allowed) Do Not wear cologne, perfume, aftershave or lotions (deodorant is allowed).  If these instructions are not followed, your test will have to be rescheduled.  If you cannot keep your appointment, please provide 24 hours notification to the Stress Lab, to avoid a possible $50 charge to your account.   Follow-Up: At [facility], you and your health needs are our priority.  As part of our continuing mission to provide you with exceptional heart care, we have created designated Provider Care Teams.  These Care Teams include your primary Cardiologist (physician) and Advanced Practice Providers (APPs -  Physician Assistants and Nurse  Practitioners) who all work together to provide you with the care you need, when you need it.  We recommend signing up for the patient portal called MyChart.  Sign up information is provided on this After Visit Summary.  MyChart is used to connect with patients for Virtual Visits (Telemedicine).  Patients are able to view lab/test results, encounter notes, upcoming appointments, etc.  Non-urgent messages can be sent to your provider as well.   To learn more about what you can do with MyChart, go to [email or web address].    Your next appointment:   6 month(s)  The format for your next appointment:   In Person  Provider:   Alean Kobus, MD    Other Instructions none  Important Information About Sugar

## 2024-02-06 ENCOUNTER — Encounter: Payer: Self-pay | Admitting: Licensed Clinical Social Worker

## 2024-02-06 ENCOUNTER — Ambulatory Visit
Admission: RE | Admit: 2024-02-06 | Discharge: 2024-02-06 | Disposition: A | Discharge status: Home / Self Care | Source: Ambulatory Visit | Attending: Radiation Oncology

## 2024-02-06 ENCOUNTER — Other Ambulatory Visit: Payer: Self-pay

## 2024-02-06 ENCOUNTER — Ambulatory Visit: Payer: Self-pay | Admitting: Licensed Clinical Social Worker

## 2024-02-06 DIAGNOSIS — Z1379 Encounter for other screening for genetic and chromosomal anomalies: Secondary | ICD-10-CM

## 2024-02-06 DIAGNOSIS — Z510 Encounter for antineoplastic radiation therapy: Secondary | ICD-10-CM | POA: Diagnosis not present

## 2024-02-06 DIAGNOSIS — D0511 Intraductal carcinoma in situ of right breast: Secondary | ICD-10-CM | POA: Diagnosis not present

## 2024-02-06 HISTORY — DX: Encounter for other screening for genetic and chromosomal anomalies: Z13.79

## 2024-02-06 LAB — RAD ONC ARIA SESSION SUMMARY
Course Elapsed Days: 28
Plan Fractions Treated to Date: 4
Plan Prescribed Dose Per Fraction: 2 Gy
Plan Total Fractions Prescribed: 5
Plan Total Prescribed Dose: 10 Gy
Reference Point Dosage Given to Date: 8 Gy
Reference Point Session Dosage Given: 2 Gy
Session Number: 20

## 2024-02-06 NOTE — Progress Notes (Signed)
 HPI:   Ms. Elzey was previously seen in the [facility] due to a personal and family history of cancer and concerns regarding a hereditary predisposition to cancer. Please refer to our prior cancer genetics clinic note for more information regarding our discussion, assessment and recommendations, at the time. Ms. Arseneau recent genetic test results were disclosed to her, as were recommendations warranted by these results. These results and recommendations are discussed in more detail below.  CANCER HISTORY:  Oncology History  Ductal carcinoma in situ (DCIS) of right breast  11/14/2023 Initial Diagnosis   Ductal carcinoma in situ (DCIS) of right breast   11/16/2023 Cancer Staging   Staging form: Breast, AJCC 8th Edition - Pathologic stage from 11/16/2023: Stage 0 (pTis (DCIS), pN0, cM0, G2, ER+, PR+, HER2: Not Assessed) - Signed by Cornelius Wanda DEL, MD on 12/12/2023 Histopathologic type: Intraductal carcinoma, noninfiltrating, NOS Stage prefix: Initial diagnosis Method of lymph node assessment: Clinical Nuclear grade: G2 Multigene prognostic tests performed: None Histologic grading system: 3 grade system Laterality: Right Tumor size (mm): 8 Lymph-vascular invasion (LVI): LVI not present (absent)/not identified Diagnostic confirmation: Positive histology PLUS positive immunophenotyping and/or positive genetic studies Specimen type: Excision Staged by: Managing physician Menopausal status: Postmenopausal Stage used in treatment planning: Yes National guidelines used in treatment planning: Yes Type of national guideline used in treatment planning: NCCN   11/28/2023 Cancer Staging   Staging form: Breast, AJCC 8th Edition - Clinical stage from 11/28/2023: Stage 0 (cTis (DCIS), cN0, cM0, G2, ER+, PR+, HER2: Not Assessed) - Signed by Cornelius Wanda DEL, MD on 11/28/2023 Histopathologic type: Intraductal carcinoma, noninfiltrating, NOS Stage prefix: Initial diagnosis Method  of lymph node assessment: Clinical Nuclear grade: G2 Histologic grading system: 3 grade system Laterality: Right Tumor size (mm): 9 Lymph-vascular invasion (LVI): Presence of LVI unknown/indeterminate Diagnostic confirmation: Positive histology Specimen type: Core Needle Biopsy Staged by: Managing physician Stage used in treatment planning: Yes National guidelines used in treatment planning: Yes Type of national guideline used in treatment planning: NCCN    Genetic Testing   Negative genetic testing. No pathogenic variants identified on the [facility] CancerNext-Expanded+RNA Panel. The report date is 02/02/2024.  The CancerNext-Expanded gene panel offered by [facility] and includes sequencing, rearrangement, and RNA analysis for the following 77 genes: AIP, ALK, APC, ATM, AXIN2, BAP1, BARD1, BMPR1A, BRCA1, BRCA2, BRIP1, CDC73, CDH1, CDK4, CDKN1B, CDKN2A, CEBPA, CHEK2, CTNNA1, DDX41, DICER1, ETV6, FH, FLCN, GATA2, LZTR1, MAX, MBD4, MEN1, MET, MLH1, MSH2, MSH3, MSH6, MUTYH, NF1, NF2, NTHL1, PALB2, PHOX2B, PMS2, POT1, PRKAR1A, PTCH1, PTEN, RAD51C, RAD51D, RB1, RET, RPS20, RUNX1, SDHA, SDHAF2, SDHB, SDHC, SDHD, SMAD4, SMARCA4, SMARCB1, SMARCE1, STK11, SUFU, TMEM127, TP53, TSC1, TSC2, VHL, and WT1 (sequencing and deletion/duplication); EGFR, HOXB13, KIT, MITF, PDGFRA, POLD1, and POLE (sequencing only); EPCAM and GREM1 (deletion/duplication only).      FAMILY HISTORY:  We obtained a detailed, 4-generation family history.  Significant diagnoses are listed below: Family History  Problem Relation Age of Onset   Alzheimer's disease Mother    Alzheimer's disease Father    Prostate cancer Father 80 - 29   Hyperlipidemia Father    Heart disease Father    Congestive Heart Failure Father    Heart attack Father    Melanoma Father 24 - 80       ear/face   Brain cancer Maternal Aunt 60       glioblastoma   Melanoma Paternal Aunt 50       metastatic to lung, brain   Melanoma  Paternal Aunt     Hemachromatosis Paternal Aunt    Heart disease Paternal Uncle    Heart attack Maternal Grandmother    Heart attack Maternal Grandfather    Stroke Paternal Grandmother    Heart attack Paternal Grandmother    Heart attack Paternal Grandfather      Ms. Guida is unaware of previous family history of genetic testing for hereditary cancer risks. Patient's maternal ancestors are of N European descent, and paternal ancestors are of N European descent. There is no reported Ashkenazi Jewish ancestry.      GENETIC TEST RESULTS:  The [facility] CancerNext-Expanded+RNA Panel found no pathogenic mutations.   The CancerNext-Expanded gene panel offered by [facility] and includes sequencing, rearrangement, and RNA analysis for the following 77 genes: AIP, ALK, APC, ATM, AXIN2, BAP1, BARD1, BMPR1A, BRCA1, BRCA2, BRIP1, CDC73, CDH1, CDK4, CDKN1B, CDKN2A, CEBPA, CHEK2, CTNNA1, DDX41, DICER1, ETV6, FH, FLCN, GATA2, LZTR1, MAX, MBD4, MEN1, MET, MLH1, MSH2, MSH3, MSH6, MUTYH, NF1, NF2, NTHL1, PALB2, PHOX2B, PMS2, POT1, PRKAR1A, PTCH1, PTEN, RAD51C, RAD51D, RB1, RET, RPS20, RUNX1, SDHA, SDHAF2, SDHB, SDHC, SDHD, SMAD4, SMARCA4, SMARCB1, SMARCE1, STK11, SUFU, TMEM127, TP53, TSC1, TSC2, VHL, and WT1 (sequencing and deletion/duplication); EGFR, HOXB13, KIT, MITF, PDGFRA, POLD1, and POLE (sequencing only); EPCAM and GREM1 (deletion/duplication only).   The test report has been scanned into EPIC and is located under the Molecular Pathology section of the Results Review tab.  A portion of the result report is included below for reference. Genetic testing reported out on 02/02/2024.     Even though a pathogenic variant was not identified, possible explanations for the cancer in the family may include: There may be no hereditary risk for cancer in the family. The cancers in Ms. Treat and/or her family may be sporadic/familial or due to other genetic and environmental factors. There may be a gene mutation in one of these genes  that current testing methods cannot detect but that chance is small. There could be another gene that has not yet been discovered, or that we have not yet tested, that is responsible for the cancer diagnoses in the family.  It is also possible there is a hereditary cause for the cancer in the family that Ms. Gathright did not inherit.  Therefore, it is important to remain in touch with cancer genetics in the future so that we can continue to offer Ms. Strong the most up to date genetic testing.   ADDITIONAL GENETIC TESTING:  We discussed with Ms. Decelles that her genetic testing was fairly extensive.  If there are additional relevant genes identified to increase cancer risk that can be analyzed in the future, we would be happy to discuss and coordinate this testing at that time.    CANCER SCREENING RECOMMENDATIONS:  Ms. Towle test result is considered negative (normal).  This means that we have not identified a hereditary cause for her personal and family history of cancer at this time.   An individual's cancer risk and medical management are not determined by genetic test results alone. Overall cancer risk assessment incorporates additional factors, including personal medical history, family history, and any available genetic information that may result in a personalized plan for cancer prevention and surveillance. Therefore, it is recommended she continue to follow the cancer management and screening guidelines provided by her oncology and primary healthcare provider.  RECOMMENDATIONS FOR FAMILY MEMBERS:   Since she did not inherit a identifiable mutation in a cancer predisposition gene included on this panel, her children could not  have inherited a known mutation from her in one of these genes. Individuals in this family might be at some increased risk of developing cancer, over the general population risk, due to the family history of cancer.  Individuals in the family should notify their providers of the  family history of cancer. We recommend women in this family have a yearly mammogram beginning at age 2, or 44 years younger than the earliest onset of cancer, an annual clinical breast exam, and perform monthly breast self-exams.  Family members should have colonoscopies by at age 80, or earlier, as recommended by their providers. Other members of the family may still carry a pathogenic variant in one of these genes that Ms. Pennella did not inherit. Based on the family history of melanoma and metastatic prostate cancer, we recommend her paternal relatives who are closely related to those who have had cancer have genetic counseling and testing. Ms. Klaus will let us  know if we can be of any assistance in coordinating genetic counseling and/or testing for this family member.     FOLLOW-UP:  Lastly, we discussed with Ms. Loureiro that cancer genetics is a rapidly advancing field and it is possible that new genetic tests will be appropriate for her and/or her family members in the future. We encouraged her to remain in contact with cancer genetics on an annual basis so we can update her personal and family histories and let her know of advances in cancer genetics that may benefit this family.   Our contact number was provided. Ms. Luevano questions were answered to her satisfaction, and she knows she is welcome to call us  at anytime with additional questions or concerns.    Dena Cary, MS, [facility] Genetic Counselor [email or web address] Phone: [phone]

## 2024-02-07 ENCOUNTER — Ambulatory Visit
Admission: RE | Admit: 2024-02-07 | Discharge: 2024-02-07 | Disposition: A | Discharge status: Home / Self Care | Source: Ambulatory Visit | Attending: Radiation Oncology | Admitting: Radiation Oncology

## 2024-02-07 ENCOUNTER — Other Ambulatory Visit: Payer: Self-pay

## 2024-02-07 DIAGNOSIS — Z510 Encounter for antineoplastic radiation therapy: Secondary | ICD-10-CM | POA: Diagnosis not present

## 2024-02-07 DIAGNOSIS — D0511 Intraductal carcinoma in situ of right breast: Secondary | ICD-10-CM | POA: Diagnosis not present

## 2024-02-07 LAB — RAD ONC ARIA SESSION SUMMARY
Course Elapsed Days: 29
Plan Fractions Treated to Date: 5
Plan Prescribed Dose Per Fraction: 2 Gy
Plan Total Fractions Prescribed: 5
Plan Total Prescribed Dose: 10 Gy
Reference Point Dosage Given to Date: 10 Gy
Reference Point Session Dosage Given: 2 Gy
Session Number: 21

## 2024-02-08 NOTE — Radiation Completion Notes (Signed)
 Patient Name: Tina [name], Tina [name] MRN: [id number] Date of Birth: 26-Jun-1951 Referring Physician: DELON CONTES, M.D. Date of Service: 2024-02-08 Radiation Oncologist: Lynwood Cedar, M.D. [facility]                             RADIATION ONCOLOGY END OF TREATMENT NOTE     Diagnosis: D05.11 Intraductal carcinoma in situ of right breast Staging on 2023-11-28: Ductal carcinoma in situ (DCIS) of right breast T=cTis (DCIS), N=cN0, M=cM0 Staging on 2023-11-16: Ductal carcinoma in situ (DCIS) of right breast T=pTis (DCIS), N=pN0, M=cM0 Intent: Curative     ==========DELIVERED PLANS==========  First Treatment Date: 2024-01-09 Last Treatment Date: 2024-02-07   Plan Name: Breast_R Site: Breast, Right Technique: Isodose Plan Mode: Photon Dose Per Fraction: 2.66 Gy Prescribed Dose (Delivered / Prescribed): 42.56 Gy / 42.56 Gy Prescribed Fxs (Delivered / Prescribed): 16 / 16   Plan Name: Breast_R_Bst Site: Breast, Right Technique: Electron Mode: Electron Dose Per Fraction: 2 Gy Prescribed Dose (Delivered / Prescribed): 10 Gy / 10 Gy Prescribed Fxs (Delivered / Prescribed): 5 / 5     ==========ON TREATMENT VISIT DATES========== 2024-01-15, 2024-01-22, 2024-01-29, 2024-02-04     ==========UPCOMING VISITS========== 05/01/2024 [facility] REMOTE PACER CK [facility] HVT DEVICE REMOTES  03/31/2024 [facility] HOME REMOTE PACER CK [facility] HVT DEVICE REMOTES  03/10/2024 [facility] PHYS PACER CK Inocencio Soyla Lunger, MD  03/05/2024 [facility] NM CUP EXERCISE TOLERANCE TEST [facility] CV ASH NM 1  03/04/2024 [facility] RAD ONC FOLLOW UP 20 Cedar Lynwood, MD  03/04/2024 [facility] MED ONC EST PT 30 Cornelius Wanda DEL, MD  02/28/2024 [facility] HOME REMOTE PACER CK [facility] HVT DEVICE REMOTES        ==========APPENDIX - ON TREATMENT VISIT NOTES==========   See weekly On Treatment Notes in Epic for details in the Media tab  (listed as Progress notes on the On Treatment Visit Dates listed above).

## 2024-02-18 NOTE — Progress Notes (Signed)
 [facility] Summary Report / Loop Recorder

## 2024-02-18 NOTE — Addendum Note (Signed)
 Addended by: VICCI SELLER A on: 02/18/2024 10:40 AM   Modules accepted: Orders

## 2024-02-22 DIAGNOSIS — J309 Allergic rhinitis, unspecified: Secondary | ICD-10-CM | POA: Diagnosis not present

## 2024-02-22 DIAGNOSIS — I4891 Unspecified atrial fibrillation: Secondary | ICD-10-CM | POA: Diagnosis not present

## 2024-02-22 DIAGNOSIS — K219 Gastro-esophageal reflux disease without esophagitis: Secondary | ICD-10-CM | POA: Diagnosis not present

## 2024-02-22 DIAGNOSIS — J45909 Unspecified asthma, uncomplicated: Secondary | ICD-10-CM | POA: Diagnosis not present

## 2024-02-22 DIAGNOSIS — G250 Essential tremor: Secondary | ICD-10-CM | POA: Diagnosis not present

## 2024-02-22 DIAGNOSIS — M810 Age-related osteoporosis without current pathological fracture: Secondary | ICD-10-CM | POA: Diagnosis not present

## 2024-02-22 DIAGNOSIS — E7800 Pure hypercholesterolemia, unspecified: Secondary | ICD-10-CM | POA: Diagnosis not present

## 2024-02-22 DIAGNOSIS — C50911 Malignant neoplasm of unspecified site of right female breast: Secondary | ICD-10-CM | POA: Diagnosis not present

## 2024-02-22 DIAGNOSIS — R0989 Other specified symptoms and signs involving the circulatory and respiratory systems: Secondary | ICD-10-CM | POA: Diagnosis not present

## 2024-02-26 ENCOUNTER — Encounter (HOSPITAL_COMMUNITY): Payer: Self-pay | Admitting: *Deleted

## 2024-02-26 NOTE — Progress Notes (Signed)
 [facility]  [address] [place],  [place]  [address] [phone]  Clinic Day: 03/04/24  Referring physician: Clemmie Nest, MD  ASSESSMENT & PLAN:  Assessment: Ductal Carcinoma in Situ of the Right Breast This is a stage 0, Tis N0 M0, and she has now had her lumpectomy.  I reviewed the final pathology and this confirms a stage 0 and excellent prognosis.  However she has had testing with DCISionRT and this reveals a score of 3.7 which does place her in a mildly elevated risk if she has surgery alone so we did recommend radiotherapy, completed on July, 24th and did well with it.  We discussed chemoprevention again and I reviewed the risks and benefits. She is agreeable to try the medication and will let me know if she has any other further toxicities or questions.   Osteoporosis She is current on her bone density scans and I have now reviewed her latest bone density scan from 08/20/23.  Her spine is normal with a T score of -1.0 and 8.6% improvement from the prior study of 08/16/21.  However her femur has a T score of -2.3 and -1.7, which is a 4.9% worsening from the prior study. So she was advised to continue the Prolia  injections every 6 months  Dr. Clemmie found that she had a vitamin D  deficiency and has her on a supplement.    Hepatic Cysts She has 1 especially large cyst measuring over 12 cm of the right lobe of the liver but this appears completely benign as a simple cyst. She has additional small cysts in the liver and right kidney which also appear benign. I reassured her about this but she was concerned about the report of hepatomegaly so we will likely follow this from time to time. She does have a history of hepatitis A at age 42.   Plan: She informed me that her PCP Dr. Clemmie noticed a rattling sound in her lungs so she referred her to an x-ray. Her chest x-ray revealed an enlarged heart and mitral valve leak that did not need immediate attention but she was  scheduled for a stress test tomorrow. She will meet with Dr. Liborio for cardiology and Dr. Shelton on 03/10/2024 for specialty testing. She completed radiation on July 24th, and states that she tolerated this well. We re-discussed the options of starting Raloxifene  for preventative measures and I informed her of the benefit. She inquired if she should still continue her Prolia  injection every 6 months and I agreed. I will order Raloxifene  60 mg. She continues oral vitamin D  supplements and takes this BID. We will recheck her bone density in 2 years. We will also plan a repeat CT of the liver to follow up on her hepatic cysts. I will see her back in 3 months with CBC, CMP, AFP, and CT of abdomen with attention to the liver.   I discussed the assessment and treatment plan with the patient and her husband.  The patient was provided an opportunity to ask questions and all were answered.  The patient agreed with the plan and demonstrated an understanding of the instructions.  The patient was advised to call back if the symptoms worsen or if the condition fails to improve as anticipated.  I provided 30 minutes of face-to-face time during this this encounter and > 50% was spent counseling as documented under my assessment and plan.   Wanda VEAR Cornish, MD [facility] [place] -  A DEPT OF [facility] [address] [place] [address] Dept: [phone] Dept Fax: [phone]   I, Jasmine Lassiter, am acting as scribe for Wanda HILARIO Cornish, MD  I have reviewed this report as typed by the medical scribe, and it is complete and accurate.  Wanda DEL Cornish, MD   9/10/20257:25 PM  CHIEF COMPLAINT:  CC:  Ductal carcinoma in situ  Current Treatment: Surgical Lumpectomy   HISTORY OF PRESENT ILLNESS:  Tina [name] is a 73 y.o. female with a history of yearly mammograms who is referred in consultation by Dr. Clorinda Bury for assessment and  management of newly diagnosed DCIS. Patient informed me that years ago she had a cyst removed from the right breast and this was found to be benign. She states that this current finding was found on routine mammogram. She then had a unilateral diagnostic mammogram on 10/25/2023, which revealed a 0.9 cm group of indeterminate posterior slightly outer central right  breast calcifications and a biopsy was recommended for further evaluation. Pathology revealed a noninvasive ductal carcinoma in situ that is solid and cribriform type with comedonecrosis and associated calcifications. This was found to be a grade 2, highly ER positive at 100%, and mildly PR positive at 30%, there was necrosis and calcifications present. She has already had a consultation with our radiation oncologist, Dr. Jomarie, last week and he will make a final recommendation after her lumpectomy pathology. She had an echocardiogram done to be cleared for her lumpectomy and she was found to have an ejection fraction of 55-50% with normal ventricle function and mild mitral valve regurgitation. However there was an incidental finding of a large cystic lesion in the liver. Her cardiologist recommended a CT or ultrasound of the liver for further evaluation. Ultrasound of the liver done on 11/23/2023 revealed hepatic cysts measuring 0.9 x 0.9 x 0.7 cm and 12.7 x 12.2 x 10.8 cm of the right lobe of the liver and simple cysts measuring 1.1 x 1.0 x 0.9cm within the mid pole of the right kidney. We will continue to monitor this either via ultrasound or CT scan since she did have some hepatomegaly but no other concerning findings. She did have a history of hepatitis A at age 47. Tina [name] informed me that she has had genetic testing done by [facility] and was found to have no genetic mutations. This tested for a limited panel including BRCA 1 & 2. Although she has no family history of breast cancer, both her father and paternal aunt have had melanoma and the aunt expired  from this. Her father has also had prostate cancer. I recommend more detailed genetic testing for clarification and she has an appointment with the genetic counselor on July, 11th.    I have reviewed her chart and materials related to her cancer extensively and collaborated history with the patient. Summary of oncologic history is as follows:  Oncology History  Ductal carcinoma in situ (DCIS) of right breast  11/14/2023 Initial Diagnosis   Ductal carcinoma in situ (DCIS) of right breast   11/16/2023 Cancer Staging   Staging form: Breast, AJCC 8th Edition - Pathologic stage from 11/16/2023: Stage 0 (pTis (DCIS), pN0, cM0, G2, ER+, PR+, HER2: Not Assessed) - Signed by Cornish Wanda DEL, MD on 12/12/2023 Histopathologic type: Intraductal carcinoma, noninfiltrating, NOS Stage prefix: Initial diagnosis Method of lymph node assessment: Clinical Nuclear grade: G2 Multigene prognostic tests performed: None Histologic grading system: 3 grade system Laterality: Right Tumor size (mm):  8 Lymph-vascular invasion (LVI): LVI not present (absent)/not identified Diagnostic confirmation: Positive histology PLUS positive immunophenotyping and/or positive genetic studies Specimen type: Excision Staged by: Managing physician Menopausal status: Postmenopausal Stage used in treatment planning: Yes National guidelines used in treatment planning: Yes Type of national guideline used in treatment planning: NCCN   11/28/2023 Cancer Staging   Staging form: Breast, AJCC 8th Edition - Clinical stage from 11/28/2023: Stage 0 (cTis (DCIS), cN0, cM0, G2, ER+, PR+, HER2: Not Assessed) - Signed by Cornelius Wanda DEL, MD on 11/28/2023 Histopathologic type: Intraductal carcinoma, noninfiltrating, NOS Stage prefix: Initial diagnosis Method of lymph node assessment: Clinical Nuclear grade: G2 Histologic grading system: 3 grade system Laterality: Right Tumor size (mm): 9 Lymph-vascular invasion (LVI): Presence of LVI  unknown/indeterminate Diagnostic confirmation: Positive histology Specimen type: Core Needle Biopsy Staged by: Managing physician Stage used in treatment planning: Yes National guidelines used in treatment planning: Yes Type of national guideline used in treatment planning: NCCN    Genetic Testing   Negative genetic testing. No pathogenic variants identified on the [facility] CancerNext-Expanded+RNA Panel. The report date is 02/02/2024.  The CancerNext-Expanded gene panel offered by [facility] and includes sequencing, rearrangement, and RNA analysis for the following 77 genes: AIP, ALK, APC, ATM, AXIN2, BAP1, BARD1, BMPR1A, BRCA1, BRCA2, BRIP1, CDC73, CDH1, CDK4, CDKN1B, CDKN2A, CEBPA, CHEK2, CTNNA1, DDX41, DICER1, ETV6, FH, FLCN, GATA2, LZTR1, MAX, MBD4, MEN1, MET, MLH1, MSH2, MSH3, MSH6, MUTYH, NF1, NF2, NTHL1, PALB2, PHOX2B, PMS2, POT1, PRKAR1A, PTCH1, PTEN, RAD51C, RAD51D, RB1, RET, RPS20, RUNX1, SDHA, SDHAF2, SDHB, SDHC, SDHD, SMAD4, SMARCA4, SMARCB1, SMARCE1, STK11, SUFU, TMEM127, TP53, TSC1, TSC2, VHL, and WT1 (sequencing and deletion/duplication); EGFR, HOXB13, KIT, MITF, PDGFRA, POLD1, and POLE (sequencing only); EPCAM and GREM1 (deletion/duplication only).     INTERVAL HISTORY:  Tina [name] is here for repeat clinical assessment of her ductal carcinoma in situ. Patient states that she feels ok and has no complaints of pain. She informed me that her PCP Dr. Clemmie noticed a rattling sound in her lungs so she referred her to an x-ray. Her chest x-ray revealed an enlarged heart and mitral valve leak that did not need immediate attention but she was scheduled for a stress test tomorrow. She will meet with Dr. Liborio for cardiology and Dr. Shelton on 03/10/2024 for specialty testing. She completed radiation on July 24th, and states that she tolerated this well. We re-discussed the options of starting Raloxifene  for preventative measures and I informed her of the benefit. She inquired if she should  still continue her Prolia  injection every 6 months and I agreed. I will order Raloxifene  60 mg. She continues oral vitamin D  supplements and takes this BID. We will recheck her bone density in 2 years. We will also plan a repeat CT of the liver to follow up on her hepatic cysts. I will see her back in 3 months with CBC, CMP, AFP, and CT of abdomen with attention to the liver.    She denies fever, chills, night sweats, or other signs of infection. She denies cardiorespiratory and gastrointestinal issues. She  denies pain. Her appetite is good and Her weight has been stable.   HISTORY:   Past Medical History:  Diagnosis Date   Abnormal mammogram of right breast 10/2016   Allergy to pollen 09/26/2018   Anxiety state 10/20/2020   Aortic atherosclerosis (HCC)    Arthritis    right Thumb   Atrophic vaginitis 04/28/2020   Breast cancer in situ 10/31/2023   Biopsy  [facility])    Chest pain in adult    Normal MPS 2016   Chronic anticoagulation 09/09/2018   Chronic obstructive lung disease (HCC) 04/14/2021   Complication of anesthesia    prolonged sedation   Dental crowns present    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   Dupuytren contracture    Bilateral hands, mild   GERD (gastroesophageal reflux disease)    Gestational diabetes    history of     Globus sensation 10/20/2020   Grade I diastolic dysfunction 05/09/2017   on ECHO   Heart murmur    Heart palpitations 10/13/2010   Overview:  Last Assessment & Plan:  Long hx of same - controlled with beta-blocker  ?orthostatic symptoms in AM with low normal BP - pt will monitor to see if symptoms correlate with BP reading No changes recommended today - reassured re: cards recommendation to take extra 1/2 tab prn symptoms    Heartburn 10/20/2020   Hepatitis    age 25 infectious     Hepatomegaly 12/21/2023   High risk medication use 09/09/2018   History of blood transfusion 1982   History of cardiomegaly  04/14/2017   Noted on CXR   History of hepatitis as a child    hepatitis A at age 35; infectious, per pt.   History of hiatal hernia    History of pleurisy    History of pneumonia    Hypercholesterolemia    Overview:  Last Assessment & Plan:  On statin Well controlled last check, monitor annually The current medical regimen is effective;  continue present plan and medications.    Hyperlipidemia    Irregular heartbeat    since age 83, per pt.   Itchy eyes 09/15/2019   Limited joint range of motion    cervical spine - difficulty looking up for extended period   Low back pain 08/03/2021   MR (mitral regurgitation) 05/09/2017   Mild, noted on ECHO   MVP (mitral valve prolapse)    Mild   Myalgia due to statin 03/07/2018   Orthostatic hypotension    Osteoporosis    Osteoporosis, post-menopausal    hx RLE fib fx 03/2012 following fall DEXA 03/13/13 @ [facility]: -2.5 at spine    PAF (paroxysmal atrial fibrillation) (HCC) 09/04/2018   Pain in left foot 01/10/2023   Pleuritic chest pain 08/24/2014   Pleuritis 04/17/2017   PONV (postoperative nausea and vomiting)    Postural dizziness 02/01/2011   Pruritus of vulva 04/19/2021   Pulmonary nodule 04/18/2017   5 mm left lower lobe nodule    Right thyroid  nodule 09/26/2018   Seasonal allergies    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   SUI (stress urinary incontinence, female)    SVT (supraventricular tachycardia) (HCC)    Syncope 08/21/2018   Tinnitus of both ears 04/26/2020   TR (tricuspid regurgitation) 05/09/2017   Mild, noted on ECHO   Urinary tract infectious disease 04/28/2020   Vestibular neuritis 11/06/2022   Wears glasses     Past Surgical History:  Procedure Laterality Date   ANTERIOR CERVICAL DECOMP/DISCECTOMY FUSION  02/03/2002   C5-6   BIOPSY  08/08/2022   Procedure: BIOPSY;  Surgeon: Dianna Specking, MD;  Location: [facility];  Service: Gastroenterology;;   BLADDER SUSPENSION N/A 04/26/2018    Procedure: TRANSVAGINAL TAPE (TVT) PROCEDURE;  Surgeon: Sarrah Browning, MD;  Location: [facility];  Service: Gynecology;  Laterality: N/A;   BREAST BIOPSY Right 11/07/2023  MM RT BREAST BX W LOC DEV 1ST LESION IMAGE BX SPEC STEREO GUIDE 11/07/2023 [facility]   BREAST BIOPSY  12/04/2023   MM RT RADIOACTIVE SEED LOC MAMMO GUIDE 12/04/2023 [facility]MO[facility]   BREAST LUMPECTOMY WITH RADIOACTIVE SEED LOCALIZATION Right 12/05/2023   Procedure: BREAST LUMPECTOMY WITH RADIOACTIVE SEED LOCALIZATION;  Surgeon: Ebbie Cough, MD;  Location: [facility] OR;  Service: General;  Laterality: Right;  RIGHT BREAST SEED GUIDED LUMPECTOMY   CATARACT EXTRACTION     CESAREAN SECTION     x 3   COLONOSCOPY  2013   several   COLONOSCOPY WITH PROPOFOL  N/A 08/08/2022   Procedure: COLONOSCOPY WITH PROPOFOL ;  Surgeon: Dianna Specking, MD;  Location: [facility];  Service: Gastroenterology;  Laterality: N/A;   CYSTOCELE REPAIR N/A 04/26/2018   Procedure: ANTERIOR REPAIR (CYSTOCELE);  Surgeon: Sarrah Browning, MD;  Location[facility];  Service: Gynecology;  Laterality: N/A;   CYSTOSCOPY N/A 04/26/2018   Procedure: CYSTOSCOPY;  Surgeon: Sarrah Browning, MD;  Location[facility];  Service: Gynecology;  Laterality: N/A;   INGUINAL HERNIA REPAIR Right    LAPAROSCOPIC NISSEN FUNDOPLICATION  07/07/2003   LOOP RECORDER IMPLANT Left    NISSEN FUNDOPLICATION  08/14/2007   redo   RADIOACTIVE SEED GUIDED EXCISIONAL BREAST BIOPSY Right 11/08/2016   Procedure: RADIOACTIVE SEED GUIDED EXCISIONAL RIGHT BREAST BIOPSY;  Surgeon: Cough Ebbie, MD;  Location: [facility];  Service: General;  Laterality: Right;   TONSILLECTOMY  1977   TUBAL LIGATION     UPPER GI ENDOSCOPY     x2   WISDOM TOOTH EXTRACTION      Family History  Problem Relation Age of Onset   Alzheimer's disease Mother    Alzheimer's disease Father    Prostate cancer Father 70 - 36    Hyperlipidemia Father    Heart disease Father    Congestive Heart Failure Father    Heart attack Father    Melanoma Father 38 - 48       ear/face   Brain cancer Maternal Aunt 60       glioblastoma   Melanoma Paternal Aunt 50       metastatic to lung, brain   Melanoma Paternal Aunt    Hemachromatosis Paternal Aunt    Heart disease Paternal Uncle    Heart attack Maternal Grandmother    Heart attack Maternal Grandfather    Stroke Paternal Grandmother    Heart attack Paternal Grandmother    Heart attack Paternal Grandfather     Social History:  reports that she has never smoked. She has never used smokeless tobacco. She reports that she does not drink alcohol and does not use drugs.The patient is accompanied by husband today.  Allergies:  Allergies  Allergen Reactions   Codeine Nausea And Vomiting, Nausea Only and Other (See Comments)    GI Intolerance, nausea   E.E.S. [Erythromycin] Other (See Comments)    TACHYCARDIA    Zocor [Simvastatin] Other (See Comments)    Leg pain   Other Other (See Comments)    Difficulty waking from anesthesia   Penicillins Rash, Dermatitis and Hives    rash    Current Medications: Current Outpatient Medications  Medication Sig Dispense Refill   AIRSUPRA 90-80 MCG/ACT AERO SMARTSIG:2 inhalation Via Inhaler Every 8 Hours PRN     acetaminophen  (TYLENOL ) 325 MG tablet Take 325 mg by mouth every 6 (six) hours as needed for pain or headache.     Ca Phosphate-Cholecalciferol (CALCIUM /VITAMIN D3 GUMMIES  PO) Take 2 each by mouth daily. 500 mg / 1000 units per gummy     Coenzyme Q10 (COQ-10) 200 MG CAPS Take 200 mg by mouth in the morning.     denosumab  (PROLIA ) 60 MG/ML SOSY injection Inject 60 mg into the skin every 6 (six) months.     ELIQUIS  5 MG TABS tablet TAKE 1 TABLET BY MOUTH TWICE A DAY 180 tablet 1   ezetimibe  (ZETIA ) 10 MG tablet TAKE 1/2 TABLET BY MOUTH DAILY 45 tablet 2   famotidine (PEPCID) 20 MG tablet Take 20 mg by mouth 2 (two) times  daily.     FIBER ADULT GUMMIES PO Take 1 each by mouth daily.     flecainide  (TAMBOCOR ) 50 MG tablet Take 1 tablet (50 mg total) by mouth 2 (two) times daily. 180 tablet 2   fluticasone  (FLONASE  ALLERGY RELIEF) 50 MCG/ACT nasal spray Place 1 spray into both nostrils daily.     fluticasone  (FLOVENT  HFA) 44 MCG/ACT inhaler Inhale 1 puff into the lungs 2 (two) times daily.     levocetirizine (XYZAL) 5 MG tablet Take 5 mg by mouth every evening.     meclizine  (ANTIVERT ) 25 MG tablet Take 25 mg by mouth daily as needed for dizziness.     montelukast (SINGULAIR) 10 MG tablet Take 10 mg by mouth at bedtime.     Omega-3 Fatty Acids (MEGARED ADVANCED OMEGA-3) 800 MG CAPS Take 800 mg by mouth daily.     pravastatin  (PRAVACHOL ) 20 MG tablet TAKE 1 TABLET BY MOUTH EVERY DAY IN THE EVENING 90 tablet 2   propranolol (INDERAL) 10 MG tablet Take 10 mg by mouth 2 (two) times daily.     raloxifene  (EVISTA ) 60 MG tablet Take 1 tablet (60 mg total) by mouth daily. 30 tablet 5   No current facility-administered medications for this visit.    REVIEW OF SYSTEMS:  Review of Systems  Constitutional: Negative.  Negative for appetite change, chills, fever and unexpected weight change.  HENT:  Negative.  Negative for hearing loss, lump/mass, mouth sores, nosebleeds, sore throat, tinnitus, trouble swallowing and voice change.   Eyes: Negative.   Respiratory: Negative.  Negative for chest tightness, cough, hemoptysis, shortness of breath and wheezing.   Cardiovascular: Negative.  Negative for chest pain, leg swelling and palpitations.  Gastrointestinal: Negative.  Negative for abdominal distention, abdominal pain, blood in stool, constipation, diarrhea, nausea, rectal pain and vomiting.  Endocrine: Negative.   Genitourinary: Negative.  Negative for difficulty urinating, dysuria, frequency and hematuria.   Musculoskeletal:  Negative for arthralgias, back pain, flank pain and gait problem.  Skin: Negative.    Neurological:  Negative for dizziness, extremity weakness, gait problem, headaches, light-headedness, numbness, seizures and speech difficulty.  Hematological: Negative.  Negative for adenopathy. Does not bruise/bleed easily.  Psychiatric/Behavioral: Negative.  Negative for depression and sleep disturbance. The patient is not nervous/anxious.     VITALS:   Blood pressure 108/63, pulse 63, temperature 98.2 F (36.8 C), temperature source Oral, resp. rate 18, height 5' 3 (1.6 m), weight 147 lb 14.4 oz (67.1 kg), SpO2 97%.  Wt Readings from Last 3 Encounters:  03/10/24 148 lb (67.1 kg)  03/04/24 147 lb 14.4 oz (67.1 kg)  02/05/24 147 lb 9.6 oz (67 kg)    Body mass index is 26.2 kg/m.  Performance status (ECOG): 0 - Asymptomatic  PHYSICAL EXAM:  Physical Exam Vitals and nursing note reviewed. Exam conducted with a chaperone present.  Constitutional:  General: She is not in acute distress.    Appearance: Normal appearance. She is normal weight. She is not ill-appearing, toxic-appearing or diaphoretic.  HENT:     Head: Normocephalic and atraumatic.     Right Ear: Tympanic membrane, ear canal and external ear normal. There is no impacted cerumen.     Left Ear: Tympanic membrane, ear canal and external ear normal. There is no impacted cerumen.     Nose: Nose normal. No congestion or rhinorrhea.     Mouth/Throat:     Mouth: Mucous membranes are moist.     Pharynx: Oropharynx is clear. No oropharyngeal exudate or posterior oropharyngeal erythema.  Eyes:     General: No scleral icterus.       Right eye: No discharge.        Left eye: No discharge.     Extraocular Movements: Extraocular movements intact.     Conjunctiva/sclera: Conjunctivae normal.     Pupils: Pupils are equal, round, and reactive to light.  Neck:     Vascular: No carotid bruit.     Comments: Fait scar of the anterior neck  Cardiovascular:     Rate and Rhythm: Normal rate and regular rhythm.     Pulses: Normal  pulses.     Heart sounds: Normal heart sounds. No murmur heard.    No friction rub. No gallop.  Pulmonary:     Effort: Pulmonary effort is normal. No respiratory distress.     Breath sounds: Normal breath sounds. No stridor. No wheezing, rhonchi or rales.  Chest:     Chest wall: No tenderness.     Comments: A little bit of scaling in the areolar complex of the right breast Just mild hyperpigmentation of the right breast Well h scar which is barely visible along the lateral areolar complex Presence of a loop recorder in the left breast Abdominal:     General: Bowel sounds are normal. There is no distension.     Palpations: Abdomen is soft. There is no hepatomegaly, splenomegaly or mass.     Tenderness: There is no abdominal tenderness. There is no right CVA tenderness, left CVA tenderness, guarding or rebound.     Hernia: No hernia is present.  Musculoskeletal:        General: Normal range of motion.     Cervical back: Normal range of motion and neck supple. No rigidity or tenderness.     Right lower leg: No edema.     Left lower leg: No edema.  Lymphadenopathy:     Cervical: No cervical adenopathy.     Right cervical: No superficial, deep or posterior cervical adenopathy.    Left cervical: No superficial, deep or posterior cervical adenopathy.     Upper Body:     Right upper body: No supraclavicular, axillary or pectoral adenopathy.     Left upper body: No supraclavicular, axillary or pectoral adenopathy.  Skin:    General: Skin is warm and dry.     Coloration: Skin is not jaundiced or pale.     Findings: No bruising, erythema, lesion or rash.  Neurological:     General: No focal deficit present.     Mental Status: She is alert and oriented to person, place, and time. Mental status is at baseline.     Cranial Nerves: No cranial nerve deficit.     Sensory: No sensory deficit.     Motor: No weakness.     Coordination: Coordination normal.     Gait: Gait  normal.     Deep Tendon  Reflexes: Reflexes normal.  Psychiatric:        Mood and Affect: Mood normal.        Behavior: Behavior normal.        Thought Content: Thought content normal.        Judgment: Judgment normal.    LABS:      Latest Ref Rng & Units 12/21/2023    9:39 AM 11/28/2023    3:18 PM 05/05/2022   10:19 AM  CBC  WBC 4.0 - 10.5 K/uL 8.1  5.9  11.0   Hemoglobin 12.0 - 15.0 g/dL 86.4  86.3  83.7   Hematocrit 36.0 - 46.0 % 39.5  39.9  47.7   Platelets 150 - 400 K/uL 224  230  346       Latest Ref Rng & Units 11/28/2023    3:18 PM 05/05/2022   10:19 AM 05/04/2021    1:25 PM  CMP  Glucose 70 - 99 mg/dL 89  86  82   BUN 8 - 23 mg/dL 19  27  21    Creatinine 0.44 - 1.00 mg/dL 9.13  9.11  9.20   Sodium 135 - 145 mmol/L 143  140  142   Potassium 3.5 - 5.1 mmol/L 4.0  3.9  4.4   Chloride 98 - 111 mmol/L 104  102  104   CO2 22 - 32 mmol/L 27  24  23    Calcium  8.9 - 10.3 mg/dL 9.9  9.1  89.9   Total Protein 6.5 - 8.1 g/dL 7.3  6.8  7.4   Total Bilirubin 0.0 - 1.2 mg/dL 0.4  0.7  0.4   Alkaline Phos 38 - 126 U/L 83  78  95   AST 15 - 41 U/L 21  22  18    ALT 0 - 44 U/L 13  38  13   No results found for: CEA1, CEA / No results found for: CEA1, CEA No results found for: PSA1 No results found for: CAN199 No results found for: CAN125  No results found for: TOTALPROTELP, ALBUMINELP, A1GS, A2GS, BETS, BETA2SER, GAMS, MSPIKE, SPEI Lab Results  Component Value Date   TIBC 367 12/21/2023   FERRITIN 56 12/21/2023   IRONPCTSAT 19 12/21/2023   No results found for: LDH  STUDIES:    EXERCISE TOLERANCE TEST (ETT) Result Date: 03/05/2024   Low risk scan.  Negative for evidence of inducible ischemia.  Inconclusive because patient achieved 75% predicted maximal heart rate. Patient exercised for 6 minutes and 4 seconds on standard Bruce protocol and achieved 75% predicted maximal heart rate with 7 METS of exercise.  Test terminated because of dyspnea.  No significant  symptoms EKG changes arrhythmias occurred.   CUP PACEART REMOTE DEVICE CHECK Result Date: 02/28/2024 ILR summary report received. Battery status OK. Normal device function. No new symptom, tachy, brady, or pause episodes. No new AF episodes. Monthly summary reports and ROV/PRN LA, CVRS   Exam: 11/23/2023 Ultrasound of the Right Upper Quadrant Impression: Hepatomegaly  Hepatic cysts measuring 0.9 x 0.9 x 0.7cm and 12.7 x 12.2 x 10.8cm, right lobe of the liver. 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 Tissue sampling is recommended   I,Jasmine M Lassiter,acting as a scribe for Wanda VEAR Cornish, MD.,have documented all relevant documentation on the behalf of Wanda VEAR Cornish, MD,as directed by  Wanda VEAR Cornish, MD while in the presence of Wanda VEAR Cornish, MD.

## 2024-02-28 ENCOUNTER — Ambulatory Visit (INDEPENDENT_AMBULATORY_CARE_PROVIDER_SITE_OTHER)

## 2024-02-28 DIAGNOSIS — R55 Syncope and collapse: Secondary | ICD-10-CM | POA: Diagnosis not present

## 2024-02-28 LAB — CUP PACEART REMOTE DEVICE CHECK
Date Time Interrogation Session: 20250813231150
Implantable Pulse Generator Implant Date: 20210412

## 2024-03-01 ENCOUNTER — Ambulatory Visit: Payer: Self-pay | Admitting: Cardiology

## 2024-03-03 ENCOUNTER — Ambulatory Visit: Payer: Self-pay | Admitting: Radiation Oncology

## 2024-03-04 ENCOUNTER — Other Ambulatory Visit: Payer: Self-pay | Admitting: Oncology

## 2024-03-04 ENCOUNTER — Ambulatory Visit
Admission: RE | Admit: 2024-03-04 | Discharge: 2024-03-04 | Disposition: A | Discharge status: Home / Self Care | Source: Ambulatory Visit | Attending: Radiation Oncology | Admitting: Radiation Oncology

## 2024-03-04 ENCOUNTER — Encounter: Payer: Self-pay | Admitting: Oncology

## 2024-03-04 ENCOUNTER — Telehealth: Payer: Self-pay | Admitting: Oncology

## 2024-03-04 ENCOUNTER — Inpatient Hospital Stay: Attending: Oncology | Admitting: Oncology

## 2024-03-04 VITALS — BP 108/63 | HR 63 | Temp 98.2°F | Resp 18 | Ht 63.0 in | Wt 147.9 lb

## 2024-03-04 DIAGNOSIS — Z923 Personal history of irradiation: Secondary | ICD-10-CM | POA: Insufficient documentation

## 2024-03-04 DIAGNOSIS — Z170 Estrogen receptor positive status [ER+]: Secondary | ICD-10-CM | POA: Diagnosis not present

## 2024-03-04 DIAGNOSIS — K769 Liver disease, unspecified: Secondary | ICD-10-CM

## 2024-03-04 DIAGNOSIS — D0511 Intraductal carcinoma in situ of right breast: Secondary | ICD-10-CM

## 2024-03-04 DIAGNOSIS — Z1721 Progesterone receptor positive status: Secondary | ICD-10-CM | POA: Insufficient documentation

## 2024-03-04 DIAGNOSIS — K7689 Other specified diseases of liver: Secondary | ICD-10-CM | POA: Diagnosis not present

## 2024-03-04 DIAGNOSIS — M810 Age-related osteoporosis without current pathological fracture: Secondary | ICD-10-CM | POA: Diagnosis not present

## 2024-03-04 DIAGNOSIS — E559 Vitamin D deficiency, unspecified: Secondary | ICD-10-CM | POA: Insufficient documentation

## 2024-03-04 MED ORDER — RALOXIFENE HCL 60 MG PO TABS: 60.0000 mg | ORAL_TABLET | Freq: Every day | ORAL | 5 refills | Status: DC

## 2024-03-04 NOTE — Telephone Encounter (Signed)
 Patient has been scheduled for follow-up visit per 03/04/24 LOS.  Pt given an appt calendar with date and time.

## 2024-03-04 NOTE — Progress Notes (Signed)
 Face to face with pt and husband in exam room. Pt is here for Med Onc and Rad Onc follow-ups today. Pt reports that she has completed her radiation therapy and denies any problems at present.

## 2024-03-04 NOTE — Progress Notes (Signed)
 Department of Radiation Oncology    Followup Note    Name: Tina [name] Date: 03/04/2024 MRN: [id number] DOB: 25-Nov-1950   Diagnosis: Ductal carcinoma in situ of the right breast    MEDICATIONS: Current Outpatient Medications  Medication Sig Dispense Refill   acetaminophen  (TYLENOL ) 325 MG tablet Take 325 mg by mouth every 6 (six) hours as needed for pain or headache.     AIRSUPRA 90-80 MCG/ACT AERO SMARTSIG:2 inhalation Via Inhaler Every 8 Hours PRN     Ca Phosphate-Cholecalciferol (CALCIUM /VITAMIN D3 GUMMIES PO) Take 2 each by mouth daily. 500 mg / 1000 units per gummy     Coenzyme Q10 (COQ-10) 200 MG CAPS Take 200 mg by mouth in the morning.     denosumab  (PROLIA ) 60 MG/ML SOSY injection Inject 60 mg into the skin every 6 (six) months.     ELIQUIS  5 MG TABS tablet TAKE 1 TABLET BY MOUTH TWICE A DAY 180 tablet 1   ezetimibe  (ZETIA ) 10 MG tablet TAKE 1/2 TABLET BY MOUTH DAILY 45 tablet 2   famotidine (PEPCID) 20 MG tablet Take 20 mg by mouth 2 (two) times daily.     FIBER ADULT GUMMIES PO Take 1 each by mouth daily.     flecainide  (TAMBOCOR ) 50 MG tablet Take 1 tablet (50 mg total) by mouth 2 (two) times daily. 180 tablet 2   fluticasone  (FLONASE  ALLERGY RELIEF) 50 MCG/ACT nasal spray Place 1 spray into both nostrils daily.     fluticasone  (FLOVENT  HFA) 44 MCG/ACT inhaler Inhale 1 puff into the lungs 2 (two) times daily.     levocetirizine (XYZAL) 5 MG tablet Take 5 mg by mouth every evening.     meclizine  (ANTIVERT ) 25 MG tablet Take 25 mg by mouth daily as needed for dizziness.     montelukast (SINGULAIR) 10 MG tablet Take 10 mg by mouth at bedtime.     Omega-3 Fatty Acids (MEGARED ADVANCED OMEGA-3) 800 MG CAPS Take 800 mg by mouth daily.     pravastatin  (PRAVACHOL ) 20 MG tablet TAKE 1 TABLET BY MOUTH EVERY DAY IN THE EVENING 90 tablet 2   propranolol (INDERAL) 10 MG tablet Take 10 mg by mouth 2 (two) times daily.     raloxifene  (EVISTA ) 60 MG tablet Take 1 tablet (60 mg  total) by mouth daily. 30 tablet 5   No current facility-administered medications for this encounter.     ALLERGIES: Codeine, E.e.s. [erythromycin], Zocor [simvastatin], Other, and Penicillins   LABORATORY DATA:  Lab Results  Component Value Date   WBC 8.1 12/21/2023   HGB 13.5 12/21/2023   HCT 39.5 12/21/2023   MCV 88.4 12/21/2023   PLT 224 12/21/2023   Lab Results  Component Value Date   NA 143 11/28/2023   K 4.0 11/28/2023   CL 104 11/28/2023   CO2 27 11/28/2023   Lab Results  Component Value Date   ALT 13 11/28/2023   AST 21 11/28/2023   ALKPHOS 83 11/28/2023   BILITOT 0.4 11/28/2023     NARRATIVE: Tina [name] was seen today in followup.  She completed adjuvant right breast radiation approximately 1 month ago.  This is her first follow-up visit.  She has commenced treatment with an aromatase inhibitor under the care of Dr. Cornelius.  She reports no problems or concerns today.  Her energy level is reasonably good.  She denies breast pain or itching.   PHYSICAL EXAMINATION:    She is in no apparent distress.  Examination of  her right breast reveals resolving erythema and hyperpigmentation.  ASSESSMENT: The patient is doing well approximately 1 month out from completion of adjuvant right breast radiation.  PLAN: She is scheduled to see Dr. Cornelius in approximately 3 months.  She will continue to follow-up routinely with Dr. Cornelius, further follow-up in our department will be on a as needed basis.    Sharne Linders A. Jomarie, MD

## 2024-03-05 ENCOUNTER — Ambulatory Visit

## 2024-03-05 DIAGNOSIS — Z5181 Encounter for therapeutic drug level monitoring: Secondary | ICD-10-CM

## 2024-03-05 DIAGNOSIS — I480 Paroxysmal atrial fibrillation: Secondary | ICD-10-CM | POA: Diagnosis not present

## 2024-03-05 DIAGNOSIS — Z79899 Other long term (current) drug therapy: Secondary | ICD-10-CM | POA: Diagnosis not present

## 2024-03-05 LAB — EXERCISE TOLERANCE TEST
Angina Index: 0
Estimated workload: 7.1
Exercise duration (min): 6 min
Exercise duration (sec): 4 s
MPHR: 147 {beats}/min
Peak HR: 111 {beats}/min
Percent HR: 75 %
RPE: 18
Rest HR: 60 {beats}/min

## 2024-03-07 ENCOUNTER — Ambulatory Visit: Payer: Self-pay

## 2024-03-09 NOTE — Progress Notes (Unsigned)
 Electrophysiology Office Note:   Date:  03/10/2024  ID:  Tina, [name] Jun 16, 1951, MRN [id number]  Primary Cardiologist: Redell Leiter, MD Primary Heart Failure: None Electrophysiologist: Shelbi Vaccaro Tina Norton, MD      History of Present Illness:   Tina [name] is a 73 y.o. female with h/o atrial fibrillation, hyperlipidemia, syncope seen today for routine electrophysiology followup.   Since last being seen in our clinic the patient reports doing well.  She has noted no further episodes of atrial fibrillation.  She has been able to do all her daily activities without restriction.  She is happy with her control.  She recently had a stress test that showed a right bundle branch block that mildly widened at peak stress.  Additionally, she was recently diagnosed with breast cancer post lumpectomy, radiation now on estrogen blockers.  she denies chest pain, palpitations, dyspnea, PND, orthopnea, nausea, vomiting, dizziness, syncope, edema, weight gain, or early satiety.   Review of systems complete and found to be negative unless listed in HPI.   Device History: Medtronic loop recorder implanted 10/27/2019 for syncope  EP Information / Studies Reviewed:    EKG is not ordered today. EKG from 02/05/2024 reviewed which showed sinus rhythm, right bundle branch block        Risk Assessment/Calculations:    CHA2DS2-VASc Score = 3   This indicates a 3.2% annual risk of stroke. The patient's score is based upon: CHF History: 0 HTN History: 1 Diabetes History: 0 Stroke History: 0 Vascular Disease History: 0 Age Score: 1 Gender Score: 1            Physical Exam:   VS:  BP 122/70   Pulse 69   Ht 5' 3 (1.6 m)   Wt 148 lb (67.1 kg)   SpO2 97%   BMI 26.22 kg/m    Wt Readings from Last 3 Encounters:  03/10/24 148 lb (67.1 kg)  03/04/24 147 lb 14.4 oz (67.1 kg)  02/05/24 147 lb 9.6 oz (67 kg)     GEN: Well nourished, well developed in no acute distress NECK: No JVD; No  carotid bruits CARDIAC: Regular rate and rhythm, no murmurs, rubs, gallops RESPIRATORY:  Clear to auscultation without rales, wheezing or rhonchi  ABDOMEN: Soft, non-tender, non-distended EXTREMITIES:  No edema; No deformity   ILR Interrogation- reviewed in detail today,  See PACEART report  ASSESSMENT AND PLAN:    Syncope s/p Medtronic Loop recorder Normal device function See Pace Art report No changes today  2.  Paroxysmal atrial fibrillation: On flecainide .  Has bundle branch block and thus flecainide  cannot be increased.  Her QRS mildly increased at peak stress on her ETT.  I think that she would likely benefit from ablation to be able to stop her flecainide .  Risks and benefits were discussed.  She understands the risks and is agreed to the procedure.    Risk, benefits, and alternatives to EP study and radiofrequency/pulse field ablation for afib were also discussed in detail today. These risks include but are not limited to stroke, bleeding, vascular damage, tamponade, perforation, damage to the esophagus, lungs, and other structures, pulmonary vein stenosis, worsening renal function, and death. The patient understands these risk and wishes to proceed.  We Tina [name] therefore proceed with catheter ablation at the next available time.  Carto, ICE, anesthesia are requested for the procedure.  This patient Tina [name] NOT require CT prior to ablation.  Can use either Carto or Opel mapping.  3.  Secondary  hypercoagulable state: On Eliquis      Follow up with Afib Clinic as usual post procedure  Signed, Tina [name] Tina Norton, MD

## 2024-03-10 ENCOUNTER — Encounter: Payer: Self-pay | Admitting: Cardiology

## 2024-03-10 ENCOUNTER — Ambulatory Visit: Attending: Cardiology | Admitting: Cardiology

## 2024-03-10 ENCOUNTER — Encounter

## 2024-03-10 VITALS — BP 122/70 | HR 69 | Ht 63.0 in | Wt 148.0 lb

## 2024-03-10 DIAGNOSIS — D6869 Other thrombophilia: Secondary | ICD-10-CM

## 2024-03-10 DIAGNOSIS — I480 Paroxysmal atrial fibrillation: Secondary | ICD-10-CM

## 2024-03-10 DIAGNOSIS — R55 Syncope and collapse: Secondary | ICD-10-CM | POA: Diagnosis not present

## 2024-03-10 DIAGNOSIS — Z01810 Encounter for preprocedural cardiovascular examination: Secondary | ICD-10-CM | POA: Diagnosis not present

## 2024-03-10 DIAGNOSIS — Z01812 Encounter for preprocedural laboratory examination: Secondary | ICD-10-CM

## 2024-03-10 NOTE — Patient Instructions (Signed)
 Medication Instructions:  Your physician recommends that you continue on your current medications as directed. Please refer to the Current Medication list given to you today.  *If you need a refill on your cardiac medications before your next appointment, please call your pharmacy*   Lab Work: Pre procedure labs -- we will call you to schedule:  BMP & CBC  If you have a lab test that is abnormal and we need to change your treatment, we will call you to review the results -- otherwise no news is good news.    Testing/Procedures: Your physician has recommended that you have an ablation. Catheter ablation is a medical procedure used to treat some cardiac arrhythmias (irregular heartbeats). During catheter ablation, a long, thin, flexible tube is put into a blood vessel in your groin (upper thigh), or neck. This tube is called an ablation catheter. It is then guided to your heart through the blood vessel. Radio frequency waves destroy small areas of heart tissue where abnormal heartbeats may cause an arrhythmia to start.   We will call you to schedule this procedure for later this year, around November   Follow-Up: At [facility], you and your health needs are our priority.  As part of our continuing mission to provide you with exceptional heart care, we have created designated Provider Care Teams.  These Care Teams include your primary Cardiologist (physician) and Advanced Practice Providers (APPs -  Physician Assistants and Nurse Practitioners) who all work together to provide you with the care you need, when you need it.  Your next appointment:   1 month(s) after your ablation  The format for your next appointment:   In Person  Provider:   AFib clinic   Thank you for choosing [facility]!!   Maeola Domino, RN [phone]    Other Instructions   Cardiac Ablation Cardiac ablation is a procedure to destroy (ablate) some heart tissue that is sending bad signals. These bad  signals cause problems in heart rhythm. The heart has many areas that make these signals. If there are problems in these areas, they can make the heart beat in a way that is not normal. Destroying some tissues can help make the heart rhythm normal. Tell your doctor about: Any allergies you have. All medicines you are taking. These include vitamins, herbs, eye drops, creams, and over-the-counter medicines. Any problems you or family members have had with medicines that make you fall asleep (anesthetics). Any blood disorders you have. Any surgeries you have had. Any medical conditions you have, such as kidney failure. Whether you are pregnant or may be pregnant. What are the risks? This is a safe procedure. But problems may occur, including: Infection. Bruising and bleeding. Bleeding into the chest. Stroke or blood clots. Damage to nearby areas of your body. Allergies to medicines or dyes. The need for a pacemaker if the normal system is damaged. Failure of the procedure to treat the problem. What happens before the procedure? Medicines Ask your doctor about: Changing or stopping your normal medicines. This is important. Taking aspirin and ibuprofen . Do not take these medicines unless your doctor tells you to take them. Taking other medicines, vitamins, herbs, and supplements. General instructions Follow instructions from your doctor about what you cannot eat or drink. Plan to have someone take you home from the hospital or clinic. If you will be going home right after the procedure, plan to have someone with you for 24 hours. Ask your doctor what steps will be  taken to prevent infection. What happens during the procedure?  An IV tube will be put into one of your veins. You will be given a medicine to help you relax. The skin on your neck or groin will be numbed. A cut (incision) will be made in your neck or groin. A needle will be put through your cut and into a large vein. A tube  (catheter) will be put into the needle. The tube will be moved to your heart. Dye may be put through the tube. This helps your doctor see your heart. Small devices (electrodes) on the tube will send out signals. A type of energy will be used to destroy some heart tissue. The tube will be taken out. Pressure will be held on your cut. This helps stop bleeding. A bandage will be put over your cut. The exact procedure may vary among doctors and hospitals. What happens after the procedure? You will be watched until you leave the hospital or clinic. This includes checking your heart rate, breathing rate, oxygen, and blood pressure. Your cut will be watched for bleeding. You will need to lie still for a few hours. Do not drive for 24 hours or as long as your doctor tells you. Summary Cardiac ablation is a procedure to destroy some heart tissue. This is done to treat heart rhythm problems. Tell your doctor about any medical conditions you may have. Tell him or her about all medicines you are taking to treat them. This is a safe procedure. But problems may occur. These include infection, bruising, bleeding, and damage to nearby areas of your body. Follow what your doctor tells you about food and drink. You may also be told to change or stop some of your medicines. After the procedure, do not drive for 24 hours or as long as your doctor tells you. This information is not intended to replace advice given to you by your health care provider. Make sure you discuss any questions you have with your health care provider. Document Revised: 09/23/2021 Document Reviewed: 06/05/2019 Elsevier Patient Education  2023 Else[employer] Inc.   Cardiac Ablation, Care After  This sheet gives you information about how to care for yourself after your procedure. Your health care provider may also give you more specific instructions. If you have problems or questions, contact your health care provider. What can I expect after  the procedure? After the procedure, it is common to have: Bruising around your puncture site. Tenderness around your puncture site. Skipped heartbeats. If you had an atrial fibrillation ablation, you may have atrial fibrillation during the first several months after your procedure.  Tiredness (fatigue).  Follow these instructions at home: Puncture site care  Follow instructions from your health care provider about how to take care of your puncture site. Make sure you: If present, leave stitches (sutures), skin glue, or adhesive strips in place. These skin closures may need to stay in place for up to 2 weeks. If adhesive strip edges start to loosen and curl up, you may trim the loose edges. Do not remove adhesive strips completely unless your health care provider tells you to do that. If a large square bandage is present, this may be removed 24 hours after surgery.  Check your puncture site every day for signs of infection. Check for: Redness, swelling, or pain. Fluid or blood. If your puncture site starts to bleed, lie down on your back, apply firm pressure to the area, and contact your health care provider. Warmth. Pus  or a bad smell. A pea or small marble sized lump at the site is normal and can take up to three months to resolve.  Driving Do not drive for at least 4 days after your procedure or however long your health care provider recommends. (Do not resume driving if you have previously been instructed not to drive for other health reasons.) Do not drive or use heavy machinery while taking prescription pain medicine. Activity Avoid activities that take a lot of effort for at least 7 days after your procedure. Do not lift anything that is heavier than 5 lb (4.5 kg) for one week.  No sexual activity for 1 week.  Return to your normal activities as told by your health care provider. Ask your health care provider what activities are safe for you. General instructions Take  over-the-counter and prescription medicines only as told by your health care provider. Do not use any products that contain nicotine or tobacco, such as cigarettes and e-cigarettes. If you need help quitting, ask your health care provider. You may shower after 24 hours, but Do not take baths, swim, or use a hot tub for 1 week.  Do not drink alcohol for 24 hours after your procedure. Keep all follow-up visits as told by your health care provider. This is important. Contact a health care provider if: You have redness, mild swelling, or pain around your puncture site. You have fluid or blood coming from your puncture site that stops after applying firm pressure to the area. Your puncture site feels warm to the touch. You have pus or a bad smell coming from your puncture site. You have a fever. You have chest pain or discomfort that spreads to your neck, jaw, or arm. You have chest pain that is worse with lying on your back or taking a deep breath. You are sweating a lot. You feel nauseous. You have a fast or irregular heartbeat. You have shortness of breath. You are dizzy or light-headed and feel the need to lie down. You have pain or numbness in the arm or leg closest to your puncture site. Get help right away if: Your puncture site suddenly swells. Your puncture site is bleeding and the bleeding does not stop after applying firm pressure to the area. These symptoms may represent a serious problem that is an emergency. Do not wait to see if the symptoms will go away. Get medical help right away. Call your local emergency services (911 in the [place] Do not drive yourself to the hospital. Summary After the procedure, it is normal to have bruising and tenderness at the puncture site in your groin, neck, or forearm. Check your puncture site every day for signs of infection. Get help right away if your puncture site is bleeding and the bleeding does not stop after applying firm pressure to the  area. This is a medical emergency. This information is not intended to replace advice given to you by your health care provider. Make sure you discuss any questions you have with your health care provider.

## 2024-03-18 ENCOUNTER — Telehealth: Payer: Self-pay | Admitting: Cardiology

## 2024-03-18 NOTE — Telephone Encounter (Signed)
 Patient calling in about having an ablation done. Please advise

## 2024-03-18 NOTE — Telephone Encounter (Signed)
 Pt advised that the office would contact her to schedule ablation and that it may be several weeks.  Patient verbalized understanding and agreeable to plan.

## 2024-03-31 ENCOUNTER — Ambulatory Visit (INDEPENDENT_AMBULATORY_CARE_PROVIDER_SITE_OTHER)

## 2024-03-31 DIAGNOSIS — R55 Syncope and collapse: Secondary | ICD-10-CM

## 2024-03-31 NOTE — Telephone Encounter (Signed)
 Left message to call back to schedule ablation procedure.

## 2024-04-01 LAB — CUP PACEART REMOTE DEVICE CHECK
Date Time Interrogation Session: 20250914230816
Implantable Pulse Generator Implant Date: 20210412

## 2024-04-01 NOTE — Telephone Encounter (Signed)
 Pt returned my call. Ablation scheduled for 12/11.  Aware office will be in touch with instructions & procedure letter. Patient verbalized understanding and agreeable to plan.

## 2024-04-01 NOTE — Telephone Encounter (Signed)
 Patient returned RN's call.

## 2024-04-07 ENCOUNTER — Ambulatory Visit: Payer: Self-pay | Admitting: Cardiology

## 2024-04-07 NOTE — Progress Notes (Signed)
 Remote Loop Recorder Transmission

## 2024-04-10 NOTE — Progress Notes (Signed)
 Remote Loop Recorder Transmission

## 2024-04-14 ENCOUNTER — Encounter

## 2024-04-23 NOTE — Progress Notes (Signed)
 Remote Loop Recorder Transmission

## 2024-05-01 ENCOUNTER — Encounter

## 2024-05-01 ENCOUNTER — Ambulatory Visit

## 2024-05-01 DIAGNOSIS — R55 Syncope and collapse: Secondary | ICD-10-CM | POA: Diagnosis not present

## 2024-05-01 LAB — CUP PACEART REMOTE DEVICE CHECK
Date Time Interrogation Session: 20251015230606
Implantable Pulse Generator Implant Date: 20210412

## 2024-05-07 DIAGNOSIS — R300 Dysuria: Secondary | ICD-10-CM | POA: Diagnosis not present

## 2024-05-07 DIAGNOSIS — N3001 Acute cystitis with hematuria: Secondary | ICD-10-CM | POA: Diagnosis not present

## 2024-05-07 NOTE — Progress Notes (Signed)
 Remote Loop Recorder Transmission

## 2024-05-13 ENCOUNTER — Ambulatory Visit: Payer: Self-pay | Admitting: Cardiology

## 2024-05-13 DIAGNOSIS — Z23 Encounter for immunization: Secondary | ICD-10-CM | POA: Diagnosis not present

## 2024-05-15 DIAGNOSIS — M810 Age-related osteoporosis without current pathological fracture: Secondary | ICD-10-CM | POA: Diagnosis not present

## 2024-05-19 ENCOUNTER — Encounter

## 2024-05-28 ENCOUNTER — Encounter (HOSPITAL_BASED_OUTPATIENT_CLINIC_OR_DEPARTMENT_OTHER): Payer: Self-pay | Admitting: Radiology

## 2024-05-29 ENCOUNTER — Other Ambulatory Visit

## 2024-05-29 ENCOUNTER — Inpatient Hospital Stay: Attending: Oncology

## 2024-05-29 ENCOUNTER — Ambulatory Visit (INDEPENDENT_AMBULATORY_CARE_PROVIDER_SITE_OTHER)
Admission: RE | Admit: 2024-05-29 | Discharge: 2024-05-29 | Disposition: A | Discharge status: Home / Self Care | Source: Ambulatory Visit | Attending: Oncology | Admitting: Oncology

## 2024-05-29 DIAGNOSIS — Z8042 Family history of malignant neoplasm of prostate: Secondary | ICD-10-CM | POA: Diagnosis not present

## 2024-05-29 DIAGNOSIS — Z1721 Progesterone receptor positive status: Secondary | ICD-10-CM | POA: Insufficient documentation

## 2024-05-29 DIAGNOSIS — Z79810 Long term (current) use of selective estrogen receptor modulators (SERMs): Secondary | ICD-10-CM | POA: Diagnosis not present

## 2024-05-29 DIAGNOSIS — M810 Age-related osteoporosis without current pathological fracture: Secondary | ICD-10-CM | POA: Insufficient documentation

## 2024-05-29 DIAGNOSIS — K769 Liver disease, unspecified: Secondary | ICD-10-CM | POA: Diagnosis not present

## 2024-05-29 DIAGNOSIS — Z170 Estrogen receptor positive status [ER+]: Secondary | ICD-10-CM | POA: Diagnosis not present

## 2024-05-29 DIAGNOSIS — D0511 Intraductal carcinoma in situ of right breast: Secondary | ICD-10-CM | POA: Insufficient documentation

## 2024-05-29 DIAGNOSIS — Z808 Family history of malignant neoplasm of other organs or systems: Secondary | ICD-10-CM | POA: Diagnosis not present

## 2024-05-29 DIAGNOSIS — K7689 Other specified diseases of liver: Secondary | ICD-10-CM | POA: Diagnosis not present

## 2024-05-29 DIAGNOSIS — E559 Vitamin D deficiency, unspecified: Secondary | ICD-10-CM | POA: Insufficient documentation

## 2024-05-29 LAB — CMP (CANCER CENTER ONLY)
ALT: 13 U/L (ref 0–44)
AST: 22 U/L (ref 15–41)
Albumin: 4 g/dL (ref 3.5–5.0)
Alkaline Phosphatase: 70 U/L (ref 38–126)
Anion gap: 9 (ref 5–15)
BUN: 14 mg/dL (ref 8–23)
CO2: 28 mmol/L (ref 22–32)
Calcium: 9.3 mg/dL (ref 8.9–10.3)
Chloride: 104 mmol/L (ref 98–111)
Creatinine: 0.82 mg/dL (ref 0.44–1.00)
GFR, Estimated: >60 mL/min (ref >60)
Glucose, Bld: 93 mg/dL (ref 70–99)
Potassium: 4.1 mmol/L (ref 3.5–5.1)
Sodium: 141 mmol/L (ref 135–145)
Total Bilirubin: 0.9 mg/dL (ref 0.0–1.2)
Total Protein: 7.2 g/dL (ref 6.5–8.1)

## 2024-05-29 LAB — CBC WITH DIFFERENTIAL (CANCER CENTER ONLY)
Abs Immature Granulocytes: 0.03 K/uL (ref 0.00–0.07)
Basophils Absolute: 0 K/uL (ref 0.0–0.1)
Basophils Relative: 1 %
Eosinophils Absolute: 0.2 K/uL (ref 0.0–0.5)
Eosinophils Relative: 3 %
HCT: 41 % (ref 36.0–46.0)
Hemoglobin: 13.7 g/dL (ref 12.0–15.0)
Immature Granulocytes: 1 %
Lymphocytes Relative: 16 %
Lymphs Abs: 1 K/uL (ref 0.7–4.0)
MCH: 30 pg (ref 26.0–34.0)
MCHC: 33.4 g/dL (ref 30.0–36.0)
MCV: 89.7 fL (ref 80.0–100.0)
Monocytes Absolute: 0.7 K/uL (ref 0.1–1.0)
Monocytes Relative: 10 %
Neutro Abs: 4.4 K/uL (ref 1.7–7.7)
Neutrophils Relative %: 69 %
Platelet Count: 193 K/uL (ref 150–400)
RBC: 4.57 MIL/uL (ref 3.87–5.11)
RDW: 12.1 % (ref 11.5–15.5)
WBC Count: 6.3 K/uL (ref 4.0–10.5)
nRBC: 0 % (ref 0.0–0.2)

## 2024-05-29 MED ORDER — IOHEXOL 300 MG/ML  SOLN
100.0000 mL | Freq: Once | INTRAMUSCULAR | Status: AC | PRN
  Administered 2024-05-29: 100 mL via INTRAVENOUS

## 2024-05-30 LAB — AFP TUMOR MARKER: AFP, Serum, Tumor Marker: 4 ng/mL (ref 0.0–9.2)

## 2024-06-01 ENCOUNTER — Encounter

## 2024-06-02 ENCOUNTER — Encounter

## 2024-06-02 DIAGNOSIS — I4891 Unspecified atrial fibrillation: Secondary | ICD-10-CM | POA: Diagnosis not present

## 2024-06-02 DIAGNOSIS — J309 Allergic rhinitis, unspecified: Secondary | ICD-10-CM | POA: Diagnosis not present

## 2024-06-02 DIAGNOSIS — R062 Wheezing: Secondary | ICD-10-CM | POA: Diagnosis not present

## 2024-06-02 DIAGNOSIS — H6991 Unspecified Eustachian tube disorder, right ear: Secondary | ICD-10-CM | POA: Diagnosis not present

## 2024-06-02 DIAGNOSIS — C50911 Malignant neoplasm of unspecified site of right female breast: Secondary | ICD-10-CM | POA: Diagnosis not present

## 2024-06-02 DIAGNOSIS — Z6826 Body mass index (BMI) 26.0-26.9, adult: Secondary | ICD-10-CM | POA: Diagnosis not present

## 2024-06-03 ENCOUNTER — Ambulatory Visit: Attending: Cardiology

## 2024-06-03 DIAGNOSIS — I480 Paroxysmal atrial fibrillation: Secondary | ICD-10-CM

## 2024-06-04 ENCOUNTER — Ambulatory Visit: Payer: Self-pay | Admitting: Cardiology

## 2024-06-04 ENCOUNTER — Telehealth: Payer: Self-pay | Admitting: Oncology

## 2024-06-04 ENCOUNTER — Telehealth: Payer: Self-pay

## 2024-06-04 ENCOUNTER — Inpatient Hospital Stay: Admitting: Oncology

## 2024-06-04 ENCOUNTER — Encounter: Payer: Self-pay | Admitting: Oncology

## 2024-06-04 VITALS — BP 121/72 | HR 62 | Temp 98.0°F | Resp 16 | Ht 63.0 in | Wt 145.5 lb

## 2024-06-04 DIAGNOSIS — K7689 Other specified diseases of liver: Secondary | ICD-10-CM | POA: Diagnosis not present

## 2024-06-04 DIAGNOSIS — Z8042 Family history of malignant neoplasm of prostate: Secondary | ICD-10-CM | POA: Diagnosis not present

## 2024-06-04 DIAGNOSIS — D0511 Intraductal carcinoma in situ of right breast: Secondary | ICD-10-CM

## 2024-06-04 DIAGNOSIS — E559 Vitamin D deficiency, unspecified: Secondary | ICD-10-CM | POA: Diagnosis not present

## 2024-06-04 DIAGNOSIS — Z1721 Progesterone receptor positive status: Secondary | ICD-10-CM | POA: Diagnosis not present

## 2024-06-04 DIAGNOSIS — M810 Age-related osteoporosis without current pathological fracture: Secondary | ICD-10-CM | POA: Diagnosis not present

## 2024-06-04 DIAGNOSIS — Z808 Family history of malignant neoplasm of other organs or systems: Secondary | ICD-10-CM | POA: Diagnosis not present

## 2024-06-04 DIAGNOSIS — Z79810 Long term (current) use of selective estrogen receptor modulators (SERMs): Secondary | ICD-10-CM | POA: Diagnosis not present

## 2024-06-04 DIAGNOSIS — Z170 Estrogen receptor positive status [ER+]: Secondary | ICD-10-CM | POA: Diagnosis not present

## 2024-06-04 LAB — CUP PACEART REMOTE DEVICE CHECK
Date Time Interrogation Session: 20251117230408
Implantable Pulse Generator Implant Date: 20210412

## 2024-06-04 NOTE — Telephone Encounter (Signed)
-----   Message from Nurse Sherri P sent at 04/08/2024 12:03 PM EDT ----- Regarding: 06/26/24  AFib Ablation Precert:  MD: Camnitz Type of ablation: A-fib Diagnosis: tachycardia CPT code: A-fib (06343) Ablation scheduled (date/time): 06/26/24  12:30 pm  Procedure:  Added to calendar? Yes Orders entered? Yes Letter complete? No, >30 days before procedure Scheduled with cath lab? Yes Any medications to hold? routine Labs ordered (CBC, BMET, PT/INR if on warfarin): Yes Mapping system: Doesn't matter CARTO/OPAL rep notified? No Cardiac CT needed? No Dye allergy? N/a Pre-meds ordered and instructions given? N/a Letter method: MyChart H&P: 8/25 Device: No  Follow-up:  Cassie/Angel, please schedule Routine  ([place] pt)

## 2024-06-04 NOTE — Telephone Encounter (Signed)
 Patient has been scheduled for follow-up visit per 06/04/24 LOS.  Pt given an appt calendar with date and time.

## 2024-06-04 NOTE — Progress Notes (Signed)
 [facility]  [address] [place],  [place]  [address] [phone]  Clinic Day: 06/04/24  Referring physician: Clemmie Nest, MD  ASSESSMENT & PLAN:  Assessment: Ductal Carcinoma in Situ of the Right Breast This is a stage 0, Tis N0 M0, and she has now had her lumpectomy.  I reviewed the final pathology and this confirms a stage 0 and excellent prognosis.  However she has had testing with DCISionRT and this reveals a score of 3.7 which does place her in a mildly elevated risk if she has surgery alone so we did recommend radiotherapy, completed on July, 24th and she did well with it.  She is on chemoprevention with Raloxifene .   Osteoporosis She is current on her bone density scans and I have now reviewed her latest bone density scan from 08/20/23.  Her spine is normal with a T score of -1.0 and 8.6% improvement from the prior study of 08/16/21.  However her femur has a T score of -2.3 and -1.7, which is a 4.9% worsening from the prior study. So she was advised to continue the Prolia  injections every 6 months  Dr. Clemmie found that she had a vitamin D  deficiency and has her on a supplement.    Hepatic Cysts She has 1 especially large cyst measuring over 12 cm of the right lobe of the liver but this appears completely benign as a simple cyst. She has additional small cysts in the liver and right kidney which also appear benign. She does have a history of hepatitis A at age 65. CT abdomen done on 05/29/2024 revealed a 13.2 x 9.4 x 13.8 cm homogeneous well-defined fluid density lesion in the central liver that shows no mural thickening/irregularity and no mural nodularity. This imaging is compatible with a simple benign cyst and a cyst was seen on chest CTA on 04/18/2017 and measured 5.6 x 5.7 x 7.5 cm at that time. An 8.2 x 10.5 x 7.7 cm well-defined homogeneous cystic lesion in the upper pole of the left kidney was also noted with some thin peripheral calcification but no internal  septation or mural nodularity. This imaging is also most compatible with a simple benign cyst and benign hepatomegaly.   Plan: She continues Raloxifene  without difficulty and informed me that she has been scheduled for a cardiac cath in December. She states that her cardiologist wants to stop or taper her off of the flecainide  50 mg BID. Patient had a CT abdomen done on 05/29/2024 that revealed a 13.2 x 9.4 x 13.8 cm homogeneous well-defined fluid density lesion in the central liver that shows no mural thickening/irregularity and no mural nodularity. This imaging is compatible with a simple benign cyst and a cyst was seen on chest CTA on 04/18/2017 and measured 5.6 x 5.7 x 7.5 cm at that time. An 8.2 x 10.5 x 7.7 cm well-defined homogeneous cystic lesion in the upper pole of the left kidney was also noted with some thin peripheral calcification but no internal septation or mural nodularity. This imaging is also most compatible with a simple benign cyst. Hepatomegaly and status post fundoplication with hiatal herniation of gastroesophageal junction is also noted. She had labs done on the same day as her scans and had a WBC of 6.3, hemoglobin of 13.7, and platelet count of 193,000. Her CMP and AFP marker were also normal. I will see her back in 3 months for reevaluation and she will be due for her annual mammogram in the Spring.  I discussed the assessment and treatment plan with the patient and her husband.  They were provided an opportunity to ask questions and all were answered.  The patient agreed with the plan and demonstrated an understanding of the instructions.  The patient was advised to call back if the symptoms worsen or if the condition fails to improve as anticipated.  I provided 23 minutes of face-to-face time during this this encounter and > 50% was spent counseling as documented under my assessment and plan.   Tina VEAR Cornish, MD [facility] - A DEPT OF  [facility] [address] [place] [address] Dept: [phone] Dept Fax: [phone]   I, Jasmine Lassiter, am acting as scribe for Tina HILARIO Cornish, MD  I have reviewed this report as typed by the medical scribe, and it is complete and accurate.  Tina VEAR Cornish, MD   11/29/202510:18 AM  CHIEF COMPLAINT:  CC:  Ductal carcinoma in situ  Current Treatment: Surgical Lumpectomy   HISTORY OF PRESENT ILLNESS:  Tina [name] is a 73 y.o. female with a history of yearly mammograms who is referred in consultation by Dr. Clorinda Bury for assessment and management of newly diagnosed DCIS. Patient informed me that years ago she had a cyst removed from the right breast and this was found to be benign. She states that this current finding was found on routine mammogram. She then had a unilateral diagnostic mammogram on 10/25/2023, which revealed a 0.9 cm group of indeterminate posterior slightly outer central right  breast calcifications and a biopsy was recommended for further evaluation. Pathology revealed a noninvasive ductal carcinoma in situ that is solid and cribriform type with comedonecrosis and associated calcifications. This was found to be a grade 2, highly ER positive at 100%, and mildly PR positive at 30%, there was necrosis and calcifications present. She has already had a consultation with our radiation oncologist, Dr. Jomarie, last week and he will make a final recommendation after her lumpectomy pathology. She had an echocardiogram done to be cleared for her lumpectomy and she was found to have an ejection fraction of 55-50% with normal ventricle function and mild mitral valve regurgitation. However there was an incidental finding of a large cystic lesion in the liver. Her cardiologist recommended a CT or ultrasound of the liver for further evaluation. Ultrasound of the liver done on 11/23/2023 revealed hepatic cysts measuring 0.9 x 0.9 x 0.7 cm and 12.7 x  12.2 x 10.8 cm of the right lobe of the liver and simple cysts measuring 1.1 x 1.0 x 0.9cm within the mid pole of the right kidney. We will continue to monitor this either via ultrasound or CT scan since she did have some hepatomegaly but no other concerning findings. She did have a history of hepatitis A at age 54. Tina [name] informed me that she has had genetic testing done by [facility] and was found to have no genetic mutations. This tested for a limited panel including BRCA 1 & 2. Although she has no family history of breast cancer, both her father and paternal aunt have had melanoma and the aunt expired from this. Her father has also had prostate cancer. I recommend more detailed genetic testing for clarification and she has an appointment with the genetic counselor on July, 11th.    I have reviewed her chart and materials related to her cancer extensively and collaborated history with the patient. Summary of oncologic history is as  follows:  Oncology History  Ductal carcinoma in situ (DCIS) of right breast  11/14/2023 Initial Diagnosis   Ductal carcinoma in situ (DCIS) of right breast   11/16/2023 Cancer Staging   Staging form: Breast, AJCC 8th Edition - Pathologic stage from 11/16/2023: Stage 0 (pTis (DCIS), pN0, cM0, G2, ER+, PR+, HER2: Not Assessed) - Signed by Cornelius Tina DEL, MD on 12/12/2023 Histopathologic type: Intraductal carcinoma, noninfiltrating, NOS Stage prefix: Initial diagnosis Method of lymph node assessment: Clinical Nuclear grade: G2 Multigene prognostic tests performed: None Histologic grading system: 3 grade system Laterality: Right Tumor size (mm): 8 Lymph-vascular invasion (LVI): LVI not present (absent)/not identified Diagnostic confirmation: Positive histology PLUS positive immunophenotyping and/or positive genetic studies Specimen type: Excision Staged by: Managing physician Menopausal status: Postmenopausal Stage used in treatment planning: Yes National guidelines  used in treatment planning: Yes Type of national guideline used in treatment planning: NCCN   11/28/2023 Cancer Staging   Staging form: Breast, AJCC 8th Edition - Clinical stage from 11/28/2023: Stage 0 (cTis (DCIS), cN0, cM0, G2, ER+, PR+, HER2: Not Assessed) - Signed by Cornelius Tina DEL, MD on 11/28/2023 Histopathologic type: Intraductal carcinoma, noninfiltrating, NOS Stage prefix: Initial diagnosis Method of lymph node assessment: Clinical Nuclear grade: G2 Histologic grading system: 3 grade system Laterality: Right Tumor size (mm): 9 Lymph-vascular invasion (LVI): Presence of LVI unknown/indeterminate Diagnostic confirmation: Positive histology Specimen type: Core Needle Biopsy Staged by: Managing physician Stage used in treatment planning: Yes National guidelines used in treatment planning: Yes Type of national guideline used in treatment planning: NCCN    Genetic Testing   Negative genetic testing. No pathogenic variants identified on the [facility] CancerNext-Expanded+RNA Panel. The report date is 02/02/2024.  The CancerNext-Expanded gene panel offered by [facility] and includes sequencing, rearrangement, and RNA analysis for the following 77 genes: AIP, ALK, APC, ATM, AXIN2, BAP1, BARD1, BMPR1A, BRCA1, BRCA2, BRIP1, CDC73, CDH1, CDK4, CDKN1B, CDKN2A, CEBPA, CHEK2, CTNNA1, DDX41, DICER1, ETV6, FH, FLCN, GATA2, LZTR1, MAX, MBD4, MEN1, MET, MLH1, MSH2, MSH3, MSH6, MUTYH, NF1, NF2, NTHL1, PALB2, PHOX2B, PMS2, POT1, PRKAR1A, PTCH1, PTEN, RAD51C, RAD51D, RB1, RET, RPS20, RUNX1, SDHA, SDHAF2, SDHB, SDHC, SDHD, SMAD4, SMARCA4, SMARCB1, SMARCE1, STK11, SUFU, TMEM127, TP53, TSC1, TSC2, VHL, and WT1 (sequencing and deletion/duplication); EGFR, HOXB13, KIT, MITF, PDGFRA, POLD1, and POLE (sequencing only); EPCAM and GREM1 (deletion/duplication only).     INTERVAL HISTORY:  Tina [name] is here for repeat clinical assessment of her ductal carcinoma in situ. She completed radiation on July 24th, 2025.  Patient states that she feels well and has no complaints of pain. She continues Raloxifene  without difficulty and informed me that she has been scheduled for an cardiac cath in December. She states that her cardiologist wants to stop or taper her off of the flecainide  50 mg BID. Patient had a CT abdomen done on 05/29/2024 that revealed a 13.2 x 9.4 x 13.8 cm homogeneous well-defined fluid density lesion in the central liver that shows no mural thickening/irregularity and no mural nodularity. This imaging is compatible with a simple benign cyst and a cyst was seen on chest CTA on 04/18/2017 and measured 5.6 x 5.7 x 7.5 cm at that time. An 8.2 x 10.5 x 7.7 cm well-defined homogeneous cystic lesion in the upper pole of the left kidney was also noted with some thin peripheral calcification but no internal septation or mural nodularity. This imaging is also most compatible with a simple benign cyst. Hepatomegaly and status post fundoplication with hiatal herniation of gastroesophageal junction is also noted. She  had labs done on the same day as her scans and had a WBC of 6.3, hemoglobin of 13.7, and platelet count of 193,000. Her CMP and AFP marker were also normal. I will see her back in 3 months for reevaluation and she will be due for her annual mammogram in the Spring.   She denies fever, chills, night sweats, or other signs of infection. She denies cardiorespiratory and gastrointestinal issues. She  denies pain. Her appetite is very good and Her weight has decreased 2 pounds over last 3 months. This patient is accompanied in the office by her husband.   HISTORY:   Past Medical History:  Diagnosis Date   Abnormal mammogram of right breast 10/2016   Allergy to pollen 09/26/2018   Anxiety state 10/20/2020   Aortic atherosclerosis    Arthritis    right Thumb   Atrophic vaginitis 04/28/2020   Breast cancer in situ 10/31/2023   Biopsy     Cancer (HCC)    Chest pain in adult    Normal MPS 2016   Chronic  anticoagulation 09/09/2018   Chronic obstructive lung disease (HCC) 04/14/2021   Complication of anesthesia    prolonged sedation   Dental crowns present    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   Dupuytren contracture    Bilateral hands, mild   GERD (gastroesophageal reflux disease)    Gestational diabetes    history of     Globus sensation 10/20/2020   Grade I diastolic dysfunction 05/09/2017   on ECHO   Heart murmur    Heart palpitations 10/13/2010   Overview:  Last Assessment & Plan:  Long hx of same - controlled with beta-blocker  ?orthostatic symptoms in AM with low normal BP - pt will monitor to see if symptoms correlate with BP reading No changes recommended today - reassured re: cards recommendation to take extra 1/2 tab prn symptoms    Heartburn 10/20/2020   Hepatitis    age 59 infectious     Hepatomegaly 12/21/2023   High risk medication use 09/09/2018   History of blood transfusion 1982   History of cardiomegaly 04/14/2017   Noted on CXR   History of hepatitis as a child    hepatitis A at age 77; infectious, per pt.   History of hiatal hernia    History of pleurisy    History of pneumonia    Hypercholesterolemia    Overview:  Last Assessment & Plan:  On statin Well controlled last check, monitor annually The current medical regimen is effective;  continue present plan and medications.    Hyperlipidemia    Irregular heartbeat    since age 71, per pt.   Itchy eyes 09/15/2019   Limited joint range of motion    cervical spine - difficulty looking up for extended period   Low back pain 08/03/2021   MR (mitral regurgitation) 05/09/2017   Mild, noted on ECHO   MVP (mitral valve prolapse)    Mild   Myalgia due to statin 03/07/2018   Orthostatic hypotension    Osteoporosis    Osteoporosis, post-menopausal    hx RLE fib fx 03/2012 following fall DEXA 03/13/13 @ [facility]: -2.5 at spine    PAF (paroxysmal atrial fibrillation) (HCC) 09/04/2018   Pain  in left foot 01/10/2023   Pleuritic chest pain 08/24/2014   Pleuritis 04/17/2017   PONV (postoperative nausea and vomiting)    Postural dizziness 02/01/2011   Pruritus of vulva 04/19/2021   Pulmonary  nodule 04/18/2017   5 mm left lower lobe nodule    Right thyroid  nodule 09/26/2018   Seasonal allergies    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   SUI (stress urinary incontinence, female)    SVT (supraventricular tachycardia)    Syncope 08/21/2018   Tinnitus of both ears 04/26/2020   TR (tricuspid regurgitation) 05/09/2017   Mild, noted on ECHO   Urinary tract infectious disease 04/28/2020   Vestibular neuritis 11/06/2022   Wears glasses     Past Surgical History:  Procedure Laterality Date   ANTERIOR CERVICAL DECOMP/DISCECTOMY FUSION  02/03/2002   C5-6   BIOPSY  08/08/2022   Procedure: BIOPSY;  Surgeon: Dianna Specking, MD;  Location: [facility];  Service: Gastroenterology;;   BLADDER SUSPENSION N/A 04/26/2018   Procedure: TRANSVAGINAL TAPE (TVT) PROCEDURE;  Surgeon: Sarrah Browning, MD;  Location: [facility];  Service: Gynecology;  Laterality: N/A;   BREAST BIOPSY Right 11/07/2023   MM RT BREAST BX W LOC DEV 1ST LESION IMAGE BX SPEC STEREO GUIDE 11/07/2023 [facility]   BREAST BIOPSY  12/04/2023   MM RT RADIOACTIVE SEED LOC MAMMO GUIDE 12/04/2023 [facility]   BREAST LUMPECTOMY WITH RADIOACTIVE SEED LOCALIZATION Right 12/05/2023   Procedure: BREAST LUMPECTOMY WITH RADIOACTIVE SEED LOCALIZATION;  Surgeon: Ebbie Cough, MD;  Location: [facility] OR;  Service: General;  Laterality: Right;  RIGHT BREAST SEED GUIDED LUMPECTOMY   CATARACT EXTRACTION     CESAREAN SECTION     x 3   COLONOSCOPY  2013   several   COLONOSCOPY WITH PROPOFOL  N/A 08/08/2022   Procedure: COLONOSCOPY WITH PROPOFOL ;  Surgeon: Dianna Specking, MD;  Location: [facility];  Service: Gastroenterology;  Laterality: N/A;   CYSTOCELE REPAIR N/A 04/26/2018    Procedure: ANTERIOR REPAIR (CYSTOCELE);  Surgeon: Sarrah Browning, MD;  Location: [facility];  Service: Gynecology;  Laterality: N/A;   CYSTOSCOPY N/A 04/26/2018   Procedure: CYSTOSCOPY;  Surgeon: Sarrah Browning, MD;  Location: [facility];  Service: Gynecology;  Laterality: N/A;   INGUINAL HERNIA REPAIR Right    LAPAROSCOPIC NISSEN FUNDOPLICATION  07/07/2003   LOOP RECORDER IMPLANT Left    NISSEN FUNDOPLICATION  08/14/2007   redo   RADIOACTIVE SEED GUIDED EXCISIONAL BREAST BIOPSY Right 11/08/2016   Procedure: RADIOACTIVE SEED GUIDED EXCISIONAL RIGHT BREAST BIOPSY;  Surgeon: Cough Ebbie, MD;  Location: [facility];  Service: General;  Laterality: Right;   TONSILLECTOMY  1977   TUBAL LIGATION     UPPER GI ENDOSCOPY     x2   WISDOM TOOTH EXTRACTION      Family History  Problem Relation Age of Onset   Alzheimer's disease Mother    Alzheimer's disease Father    Prostate cancer Father 60 - 13   Hyperlipidemia Father    Heart disease Father    Congestive Heart Failure Father    Heart attack Father    Melanoma Father 84 - 68       ear/face   Brain cancer Maternal Aunt 60       glioblastoma   Melanoma Paternal Aunt 50       metastatic to lung, brain   Melanoma Paternal Aunt    Hemachromatosis Paternal Aunt    Heart disease Paternal Uncle    Heart attack Maternal Grandmother    Heart attack Maternal Grandfather    Stroke Paternal Grandmother    Heart attack Paternal Grandmother    Heart attack Paternal Grandfather     Social  History:  reports that she has never smoked. She has never used smokeless tobacco. She reports that she does not drink alcohol and does not use drugs.The patient is accompanied by husband today.  Allergies:  Allergies  Allergen Reactions   Penicillins Dermatitis, Hives and Rash    rash   Codeine Nausea And Vomiting, Nausea Only and Other (See Comments)    GI Intolerance, nausea  nausea    Other  Reaction(s): GI Intolerance, Other (See Comments)  nausea    GI Intolerance, nausea   E.E.S. [Erythromycin] Other (See Comments)    TACHYCARDIA    Simvastatin Other (See Comments)    Leg pain   Other Other (See Comments)    Difficulty waking from anesthesia    Current Medications: Current Outpatient Medications  Medication Sig Dispense Refill   acetaminophen  (TYLENOL ) 325 MG tablet Take 325 mg by mouth every 6 (six) hours as needed for pain or headache.     AIRSUPRA 90-80 MCG/ACT AERO SMARTSIG:2 inhalation Via Inhaler Every 8 Hours PRN     Ca Phosphate-Cholecalciferol (CALCIUM /VITAMIN D3 GUMMIES PO) Take 2 each by mouth daily. 500 mg / 1000 units per gummy     Coenzyme Q10 (COQ-10) 200 MG CAPS Take 200 mg by mouth in the morning.     denosumab  (PROLIA ) 60 MG/ML SOSY injection Inject 60 mg into the skin every 6 (six) months.     ELIQUIS  5 MG TABS tablet TAKE 1 TABLET BY MOUTH TWICE A DAY 180 tablet 1   ezetimibe  (ZETIA ) 10 MG tablet TAKE 1/2 TABLET BY MOUTH DAILY 45 tablet 2   famotidine (PEPCID) 20 MG tablet Take 20 mg by mouth 2 (two) times daily.     FIBER ADULT GUMMIES PO Take 1 each by mouth daily.     flecainide  (TAMBOCOR ) 50 MG tablet Take 1 tablet (50 mg total) by mouth 2 (two) times daily. 180 tablet 2   fluticasone  (FLONASE  ALLERGY RELIEF) 50 MCG/ACT nasal spray Place 1 spray into both nostrils daily.     fluticasone  (FLOVENT  HFA) 44 MCG/ACT inhaler Inhale 1 puff into the lungs 2 (two) times daily.     levocetirizine (XYZAL) 5 MG tablet Take 5 mg by mouth every evening.     meclizine  (ANTIVERT ) 25 MG tablet Take 25 mg by mouth daily as needed for dizziness.     montelukast (SINGULAIR) 10 MG tablet Take 10 mg by mouth at bedtime.     Omega-3 Fatty Acids (MEGARED ADVANCED OMEGA-3) 800 MG CAPS Take 800 mg by mouth daily.     pravastatin  (PRAVACHOL ) 20 MG tablet TAKE 1 TABLET BY MOUTH EVERY DAY IN THE EVENING 90 tablet 2   propranolol (INDERAL) 10 MG tablet Take 10 mg by mouth 2  (two) times daily.     raloxifene  (EVISTA ) 60 MG tablet Take 1 tablet (60 mg total) by mouth daily. 30 tablet 5   No current facility-administered medications for this visit.    REVIEW OF SYSTEMS:  Review of Systems  Constitutional: Negative.  Negative for appetite change, chills, fever and unexpected weight change.  HENT:  Negative.  Negative for hearing loss, lump/mass, mouth sores, nosebleeds, sore throat, tinnitus, trouble swallowing and voice change.   Eyes: Negative.   Respiratory: Negative.  Negative for chest tightness, cough, hemoptysis, shortness of breath and wheezing.   Cardiovascular: Negative.  Negative for chest pain, leg swelling and palpitations.  Gastrointestinal: Negative.  Negative for abdominal distention, abdominal pain, blood in stool, constipation, diarrhea, nausea, rectal pain  and vomiting.  Endocrine: Negative.   Genitourinary: Negative.  Negative for difficulty urinating, dysuria, frequency and hematuria.   Musculoskeletal:  Negative for arthralgias, back pain, flank pain and gait problem.  Skin: Negative.   Neurological:  Negative for dizziness, extremity weakness, gait problem, headaches, light-headedness, numbness, seizures and speech difficulty.  Hematological: Negative.  Negative for adenopathy. Does not bruise/bleed easily.  Psychiatric/Behavioral: Negative.  Negative for depression and sleep disturbance. The patient is not nervous/anxious.     VITALS:   Blood pressure 121/72, pulse 62, temperature 98 F (36.7 C), temperature source Oral, resp. rate 16, height 5' 3 (1.6 m), weight 145 lb 8 oz (66 kg), SpO2 95%.  Wt Readings from Last 3 Encounters:  06/04/24 145 lb 8 oz (66 kg)  03/10/24 148 lb (67.1 kg)  03/04/24 147 lb 14.4 oz (67.1 kg)    Body mass index is 25.77 kg/m.  Performance status (ECOG): 0 - Asymptomatic  PHYSICAL EXAM:  Physical Exam Vitals and nursing note reviewed. Exam conducted with a chaperone present.  Constitutional:       General: She is not in acute distress.    Appearance: Normal appearance. She is normal weight. She is not ill-appearing, toxic-appearing or diaphoretic.  HENT:     Head: Normocephalic and atraumatic.     Right Ear: Tympanic membrane, ear canal and external ear normal. There is no impacted cerumen.     Left Ear: Tympanic membrane, ear canal and external ear normal. There is no impacted cerumen.     Nose: Nose normal. No congestion or rhinorrhea.     Mouth/Throat:     Mouth: Mucous membranes are moist.     Pharynx: Oropharynx is clear. No oropharyngeal exudate or posterior oropharyngeal erythema.  Eyes:     General: No scleral icterus.       Right eye: No discharge.        Left eye: No discharge.     Extraocular Movements: Extraocular movements intact.     Conjunctiva/sclera: Conjunctivae normal.     Pupils: Pupils are equal, round, and reactive to light.  Neck:     Vascular: No carotid bruit.     Comments: Fait scar of the anterior neck  Cardiovascular:     Rate and Rhythm: Normal rate and regular rhythm.     Pulses: Normal pulses.     Heart sounds: Normal heart sounds. No murmur heard.    No friction rub. No gallop.  Pulmonary:     Effort: Pulmonary effort is normal. No respiratory distress.     Breath sounds: Normal breath sounds. No stridor. No wheezing, rhonchi or rales.  Chest:     Chest wall: No tenderness.     Comments: Bilateral mild fibrocystic changes Faint incision along the upper outer areolar complex on the right  Slight scaling of the right nipple Presence of a loop recorder in the left breast Abdominal:     General: Bowel sounds are normal. There is no distension.     Palpations: Abdomen is soft. There is no hepatomegaly, splenomegaly or mass.     Tenderness: There is no abdominal tenderness. There is no right CVA tenderness, left CVA tenderness, guarding or rebound.     Hernia: No hernia is present.  Musculoskeletal:        General: Normal range of motion.      Cervical back: Normal range of motion and neck supple. No rigidity or tenderness.     Right lower leg: No edema.  Left lower leg: No edema.  Lymphadenopathy:     Cervical: No cervical adenopathy.     Right cervical: No superficial, deep or posterior cervical adenopathy.    Left cervical: No superficial, deep or posterior cervical adenopathy.     Upper Body:     Right upper body: No supraclavicular, axillary or pectoral adenopathy.     Left upper body: No supraclavicular, axillary or pectoral adenopathy.  Skin:    General: Skin is warm and dry.     Coloration: Skin is not jaundiced or pale.     Findings: No bruising, erythema, lesion or rash.  Neurological:     General: No focal deficit present.     Mental Status: She is alert and oriented to person, place, and time. Mental status is at baseline.     Cranial Nerves: No cranial nerve deficit.     Sensory: No sensory deficit.     Motor: No weakness.     Coordination: Coordination normal.     Gait: Gait normal.     Deep Tendon Reflexes: Reflexes normal.  Psychiatric:        Mood and Affect: Mood normal.        Behavior: Behavior normal.        Thought Content: Thought content normal.        Judgment: Judgment normal.    LABS:      Latest Ref Rng & Units 05/29/2024    8:03 AM 12/21/2023    9:39 AM 11/28/2023    3:18 PM  CBC  WBC 4.0 - 10.5 K/uL 6.3  8.1  5.9   Hemoglobin 12.0 - 15.0 g/dL 86.2  86.4  86.3   Hematocrit 36.0 - 46.0 % 41.0  39.5  39.9   Platelets 150 - 400 K/uL 193  224  230       Latest Ref Rng & Units 05/29/2024    8:03 AM 11/28/2023    3:18 PM 05/05/2022   10:19 AM  CMP  Glucose 70 - 99 mg/dL 93  89  86   BUN 8 - 23 mg/dL 14  19  27    Creatinine 0.44 - 1.00 mg/dL 9.17  9.13  9.11   Sodium 135 - 145 mmol/L 141  143  140   Potassium 3.5 - 5.1 mmol/L 4.1  4.0  3.9   Chloride 98 - 111 mmol/L 104  104  102   CO2 22 - 32 mmol/L 28  27  24    Calcium  8.9 - 10.3 mg/dL 9.3  9.9  9.1   Total Protein 6.5 - 8.1  g/dL 7.2  7.3  6.8   Total Bilirubin 0.0 - 1.2 mg/dL 0.9  0.4  0.7   Alkaline Phos 38 - 126 U/L 70  83  78   AST 15 - 41 U/L 22  21  22    ALT 0 - 44 U/L 13  13  38   No results found for: CEA1, CEA / No results found for: CEA1, CEA No results found for: PSA1 No results found for: CAN199 No results found for: CAN125  No results found for: TOTALPROTELP, ALBUMINELP, A1GS, A2GS, BETS, BETA2SER, GAMS, MSPIKE, SPEI Lab Results  Component Value Date   TIBC 367 12/21/2023   FERRITIN 56 12/21/2023   IRONPCTSAT 19 12/21/2023   No results found for: LDH  STUDIES:  CUP PACEART REMOTE DEVICE CHECK Result Date: 06/04/2024 ILR summary report received. Battery status OK. Normal device function. No new symptom, tachy, brady, or  pause episodes. No new AF episodes. Monthly summary reports and ROV/PRN LA, CVRS  CT ABDOMEN W CONTRAST Result Date: 05/31/2024 CLINICAL DATA:  Liver lesion.  History of malignancy. EXAM: CT ABDOMEN WITH CONTRAST TECHNIQUE: Multidetector CT imaging of the abdomen was performed using the standard protocol following bolus administration of intravenous contrast. RADIATION DOSE REDUCTION: This exam was performed according to the departmental dose-optimization program which includes automated exposure control, adjustment of the mA and/or kV according to patient size and/or use of iterative reconstruction technique. CONTRAST:  OMNIPAQUE  IOHEXOL  300 MG/ML  SOLN COMPARISON:  Abdominal ultrasound 11/20/2010 FINDINGS: Lower chest: Surgical changes in the stomach suggest prior fundoplication with hiatal herniation of gastroesophageal junction. Hepatobiliary: Liver measures over 25 cm craniocaudal length. 13.2 x 9.4 x 13.8 cm homogeneous well-defined fluid density lesion in the central liver shows no mural thickening/irregularity and no mural nodularity. Imaging features compatible with simple benign cyst, progressive in the interval since prior ultrasound.  A cyst was seen on chest CTA 04/18/2017 and measured 5.6 x 5.7 by 7.5 cm at that time. Multiple additional smaller hepatic cysts evident. Scattered tiny hypodensities in the liver parenchyma are too small to characterize but are statistically most likely benign. No followup imaging is recommended. There is no evidence for gallstones, gallbladder wall thickening, or pericholecystic fluid. Common bile duct diameter upper normal at 5-6 mm. Pancreas: No focal mass lesion. No dilatation of the main duct. No intraparenchymal cyst. No peripancreatic edema. Spleen: Tiny hypodensities in the spleen are too small to characterize but statistically most likely benign. Adrenals/Urinary Tract: No adrenal nodule or mass. Tiny well-defined homogeneous low-density lesions in the right kidney are too small to characterize but statistically most likely benign and probably cysts. No followup imaging is recommended. 8.2 x 10.5 x 7.7 cm well-defined homogeneous cystic lesion identified upper pole left kidney. This has some thin peripheral calcification but no internal septation or mural nodularity. Imaging features most compatible with simple benign cyst. No followup imaging is recommended. Tiny well-defined homogeneous low-density lesions in the left kidney are too small to characterize but are statistically most likely benign and probably cysts. No followup imaging is recommended. Stomach/Bowel: Status post fundoplication. Stomach is nondistended. Duodenum is normally positioned as is the ligament of Treitz. No small bowel or colonic dilatation within the visualized abdomen. Vascular/Lymphatic: There is moderate atherosclerotic calcification of the abdominal aorta without aneurysm. There is no gastrohepatic or hepatoduodenal ligament lymphadenopathy. No retroperitoneal or mesenteric lymphadenopathy. Other: No intraperitoneal free fluid. Musculoskeletal: No worrisome lytic or sclerotic osseous abnormality. IMPRESSION: 1. 13.2 x 9.4 x  13.8 cm homogeneous well-defined fluid density lesion in the central liver shows no mural thickening/irregularity and no mural nodularity. Imaging features compatible with simple benign cyst. A cyst was seen on chest CTA 04/18/2017 and measured 5.6 x 5.7 x 7.5 cm at that time. 2. 8.2 x 10.5 x 7.7 cm well-defined homogeneous cystic lesion upper pole left kidney with some thin peripheral calcification but no internal septation or mural nodularity. Imaging features most compatible with simple benign cyst. 3. Hepatomegaly. 4. 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 5.  Aortic Atherosclerosis (ICD10-I70.0). Electronically Signed   By: Camellia Candle M.D.   On: 05/31/2024 07:47    Exam: 11/23/2023 Ultrasound of the Right Upper Quadrant Impression: Hepatomegaly  Hepatic cysts measuring 0.9 x 0.9 x 0.7cm and 12.7 x 12.2 x 10.8cm, right lobe of the liver. No evidence of cholelithiasis No evidence of nephrolithiasis or hydronephrosis, right kidney Findings compatible  with right renal parenchymal disease.  Simple cyst measuring 1.1 x 1.0 x 0.9cm within mid pole, right kidney No evidence of scites   I,Jasmine M Lassiter,acting as a scribe for Tina VEAR Cornish, MD.,have documented all relevant documentation on the behalf of Tina VEAR Cornish, MD,as directed by  Tina VEAR Cornish, MD while in the presence of Tina VEAR Cornish, MD.

## 2024-06-05 ENCOUNTER — Telehealth (HOSPITAL_COMMUNITY): Payer: Self-pay

## 2024-06-05 ENCOUNTER — Encounter (HOSPITAL_COMMUNITY): Payer: Self-pay

## 2024-06-05 DIAGNOSIS — Z01419 Encounter for gynecological examination (general) (routine) without abnormal findings: Secondary | ICD-10-CM | POA: Diagnosis not present

## 2024-06-05 NOTE — Telephone Encounter (Signed)
 Attempted to reach patient to discuss upcoming procedure, no answer. Left VM for patient to return call.

## 2024-06-06 NOTE — Progress Notes (Signed)
 Remote Loop Recorder Transmission

## 2024-06-06 NOTE — Telephone Encounter (Signed)
 Spoke with patient to complete pre-procedure call.     Health status review:  Any new medical conditions, recent signs of acute illness or been started on antibiotics? No Any recent hospitalizations or surgeries? No Any new medications started since pre-op visit? No  Follow all medication instructions prior to procedure or the procedure may be rescheduled:    Continue taking Eliquis  (Apixaban ) twice daily without missing any doses before procedure. Essential chronic medications:  No medication should be continued, unless told otherwise. On the morning of your procedure DO NOT take any medication., including Eliquis  (Apixaban ).  Nothing to eat or drink after midnight prior to your procedure.  Pre-procedure testing scheduled: CT not needed and lab work completed.  Confirmed patient is scheduled for Atrial Fibrillation Ablation on Thursday, December 11 with Dr. Inocencio. Instructed patient to arrive at the Main Entrance A at [facility]: [address] [place], [place] [address] and check in at Admitting at 9:30 AM.  Plan to go home the same day, you will only stay overnight if medically necessary. You MUST have a responsible adult to drive you home and MUST be with you the first 24 hours after you arrive home or your procedure could be cancelled.  Informed a nurse may call a day before the procedure to confirm arrival time and ensure instructions are followed.  Patient verbalized understanding to information provided and is agreeable to proceed with procedure.   Advised to contact RN Navigator at [phone], to inform of any new medications started after call or concerns prior to procedure.

## 2024-06-14 DIAGNOSIS — K7689 Other specified diseases of liver: Secondary | ICD-10-CM

## 2024-06-14 HISTORY — DX: Other specified diseases of liver: K76.89

## 2024-06-18 NOTE — Progress Notes (Signed)
 Tina [name]                                          MRN: [id number]   06/18/2024   The [facility] Quality Team Specialist reviewed this patient medical record for the purposes of chart review for care gap closure. The following were reviewed: chart review for care gap closure-kidney health evaluation for diabetes:eGFR  and uACR.    [facility]BCI Quality Team

## 2024-06-23 ENCOUNTER — Encounter

## 2024-06-25 NOTE — Pre-Procedure Instructions (Signed)
 Attempted to call patient regarding procedure instructions.  Left voicemail on the following items: Arrival time 0930 Nothing to eat or drink after midnight No meds AM of procedure Responsible person to drive you home and stay with you for 24 hrs  Have you missed any doses of anti-coagulant Eliquis - should be taken twice a day,  if you have missed any dose please let us  know.  Don't take dose morning of procedure.

## 2024-06-26 ENCOUNTER — Ambulatory Visit (HOSPITAL_COMMUNITY): Admitting: Registered Nurse

## 2024-06-26 ENCOUNTER — Ambulatory Visit (HOSPITAL_COMMUNITY)
Admission: RE | Admit: 2024-06-26 | Discharge: 2024-06-26 | Disposition: A | Attending: Cardiology | Admitting: Cardiology

## 2024-06-26 ENCOUNTER — Other Ambulatory Visit: Payer: Self-pay

## 2024-06-26 ENCOUNTER — Ambulatory Visit (HOSPITAL_COMMUNITY): Admission: RE | Disposition: A | Payer: Self-pay | Attending: Cardiology

## 2024-06-26 DIAGNOSIS — R55 Syncope and collapse: Secondary | ICD-10-CM | POA: Insufficient documentation

## 2024-06-26 DIAGNOSIS — Z79899 Other long term (current) drug therapy: Secondary | ICD-10-CM | POA: Insufficient documentation

## 2024-06-26 DIAGNOSIS — E785 Hyperlipidemia, unspecified: Secondary | ICD-10-CM | POA: Insufficient documentation

## 2024-06-26 DIAGNOSIS — I4819 Other persistent atrial fibrillation: Secondary | ICD-10-CM | POA: Diagnosis not present

## 2024-06-26 DIAGNOSIS — K219 Gastro-esophageal reflux disease without esophagitis: Secondary | ICD-10-CM | POA: Insufficient documentation

## 2024-06-26 DIAGNOSIS — E119 Type 2 diabetes mellitus without complications: Secondary | ICD-10-CM | POA: Insufficient documentation

## 2024-06-26 DIAGNOSIS — Z95818 Presence of other cardiac implants and grafts: Secondary | ICD-10-CM | POA: Insufficient documentation

## 2024-06-26 HISTORY — PX: ATRIAL FIBRILLATION ABLATION: EP1191

## 2024-06-26 LAB — POCT ACTIVATED CLOTTING TIME: Activated Clotting Time: 435 s

## 2024-06-26 SURGERY — ATRIAL FIBRILLATION ABLATION
Anesthesia: General

## 2024-06-26 MED ORDER — ONDANSETRON HCL 4 MG/2ML IJ SOLN
INTRAMUSCULAR | Status: DC | PRN
  Administered 2024-06-26: 4 mg via INTRAVENOUS

## 2024-06-26 MED ORDER — HEPARIN SODIUM (PORCINE) 1000 UNIT/ML IJ SOLN
INTRAMUSCULAR | Status: DC | PRN
  Administered 2024-06-26: 14000 [IU] via INTRAVENOUS

## 2024-06-26 MED ORDER — ACETAMINOPHEN 325 MG PO TABS: 650.0000 mg | ORAL_TABLET | ORAL | Status: DC | PRN

## 2024-06-26 MED ORDER — HEPARIN SODIUM (PORCINE) 1000 UNIT/ML IJ SOLN
INTRAMUSCULAR | Status: DC | PRN
  Administered 2024-06-26: 1000 [IU] via INTRAVENOUS

## 2024-06-26 MED ORDER — PROPOFOL 500 MG/50ML IV EMUL
INTRAVENOUS | Status: DC | PRN
  Administered 2024-06-26: 120 ug/kg/min via INTRAVENOUS

## 2024-06-26 MED ORDER — ONDANSETRON HCL 4 MG/2ML IJ SOLN: 4.0000 mg | Freq: Four times a day (QID) | INTRAMUSCULAR | Status: DC | PRN

## 2024-06-26 MED ORDER — SODIUM CHLORIDE 0.9% FLUSH: 3.0000 mL | INTRAVENOUS | Status: DC | PRN

## 2024-06-26 MED ORDER — PROTAMINE SULFATE 10 MG/ML IV SOLN
INTRAVENOUS | Status: DC | PRN
  Administered 2024-06-26: 10 mg via INTRAVENOUS
  Administered 2024-06-26: 30 mg via INTRAVENOUS

## 2024-06-26 MED ORDER — LIDOCAINE 2% (20 MG/ML) 5 ML SYRINGE
INTRAMUSCULAR | Status: DC | PRN
  Administered 2024-06-26: 100 mg via INTRAVENOUS

## 2024-06-26 MED ORDER — DEXAMETHASONE SOD PHOSPHATE PF 10 MG/ML IJ SOLN
INTRAMUSCULAR | Status: DC | PRN
  Administered 2024-06-26: 5 mg via INTRAVENOUS

## 2024-06-26 MED ORDER — FENTANYL CITRATE (PF) 250 MCG/5ML IJ SOLN
INTRAMUSCULAR | Status: DC | PRN
  Administered 2024-06-26 (×2): 25 ug via INTRAVENOUS

## 2024-06-26 MED ORDER — PHENYLEPHRINE HCL-NACL 20-0.9 MG/250ML-% IV SOLN
INTRAVENOUS | Status: DC | PRN
  Administered 2024-06-26: 20 ug/min via INTRAVENOUS

## 2024-06-26 MED ORDER — SODIUM CHLORIDE 0.9 % IV SOLN: 250.0000 mL | INTRAVENOUS | Status: DC | PRN

## 2024-06-26 MED ORDER — SODIUM CHLORIDE 0.9 % IV SOLN: INTRAVENOUS | Status: DC

## 2024-06-26 MED ORDER — PHENYLEPHRINE 80 MCG/ML (10ML) SYRINGE FOR IV PUSH (FOR BLOOD PRESSURE SUPPORT)
PREFILLED_SYRINGE | INTRAVENOUS | Status: DC | PRN
  Administered 2024-06-26: 80 ug via INTRAVENOUS

## 2024-06-26 MED ORDER — SUCCINYLCHOLINE CHLORIDE 200 MG/10ML IV SOSY: PREFILLED_SYRINGE | INTRAVENOUS | Status: DC | PRN

## 2024-06-26 MED ORDER — SUGAMMADEX SODIUM 200 MG/2ML IV SOLN
INTRAVENOUS | Status: DC | PRN
  Administered 2024-06-26: 257.6 mg via INTRAVENOUS

## 2024-06-26 MED ORDER — PROPOFOL 10 MG/ML IV BOLUS
INTRAVENOUS | Status: DC | PRN
  Administered 2024-06-26: 100 mg via INTRAVENOUS
  Administered 2024-06-26: 20 mg via INTRAVENOUS

## 2024-06-26 MED ORDER — FENTANYL CITRATE (PF) 100 MCG/2ML IJ SOLN
INTRAMUSCULAR | Status: AC
  Filled 2024-06-26: qty 2

## 2024-06-26 MED ORDER — HEPARIN (PORCINE) IN NACL 1000-0.9 UT/500ML-% IV SOLN
INTRAVENOUS | Status: DC | PRN
  Administered 2024-06-26 (×2): 500 mL

## 2024-06-26 MED ORDER — ATROPINE SULFATE 1 MG/10ML IJ SOSY
PREFILLED_SYRINGE | INTRAMUSCULAR | Status: DC | PRN
  Administered 2024-06-26: 1 mg via INTRAVENOUS

## 2024-06-26 MED ORDER — ROCURONIUM BROMIDE 10 MG/ML (PF) SYRINGE
PREFILLED_SYRINGE | INTRAVENOUS | Status: DC | PRN
  Administered 2024-06-26: 10 mg via INTRAVENOUS
  Administered 2024-06-26: 40 mg via INTRAVENOUS
  Administered 2024-06-26: 10 mg via INTRAVENOUS

## 2024-06-26 SURGICAL SUPPLY — 17 items
BLANKET WARM UNDERBOD FULL ACC (MISCELLANEOUS) ×1 IMPLANT
CABLE VARIPULSE STERILE (CATHETERS) IMPLANT
CATH DECANAV D CURVE (CATHETERS) IMPLANT
CATH GE 8FR SOUNDSTAR (CATHETERS) IMPLANT
CATH WEB BI DIR CSDF CRV REPRO (CATHETERS) IMPLANT
CATHETER VARIPULSE 8.5FR (CATHETERS) IMPLANT
CLOSURE MYNX CONTROL 6F/7F (Vascular Products) IMPLANT
CLOSURE PERCLOSE PROSTYLE (Vascular Products) IMPLANT
COVER SWIFTLINK CONNECTOR (BAG) ×1 IMPLANT
KIT VERSACROSS 8.5F 63 45D 180 (KITS) IMPLANT
PACK EP LF (CUSTOM PROCEDURE TRAY) ×1 IMPLANT
PAD DEFIB RADIO PHYSIO CONN (PAD) ×1 IMPLANT
PATCH CARTO3 (PAD) IMPLANT
SHEATH CARTO VIZIGO MED CURVE (SHEATH) IMPLANT
SHEATH PINNACLE 8F 10CM (SHEATH) IMPLANT
SHEATH PINNACLE 9F 10CM (SHEATH) IMPLANT
TUBING SMART ABLATE COOLFLOW (TUBING) IMPLANT

## 2024-06-26 NOTE — Discharge Instructions (Signed)

## 2024-06-26 NOTE — Anesthesia Postprocedure Evaluation (Signed)
 Anesthesia Post Note  Patient: Tina [name]  Procedure(s) Performed: ATRIAL FIBRILLATION ABLATION     Patient location during evaluation: PACU Anesthesia Type: General Level of consciousness: awake and alert Pain management: pain level controlled Vital Signs Assessment: post-procedure vital signs reviewed and stable Respiratory status: spontaneous breathing, nonlabored ventilation, respiratory function stable and patient connected to nasal cannula oxygen Cardiovascular status: blood pressure returned to baseline and stable Postop Assessment: no apparent nausea or vomiting Anesthetic complications: no   There were no known notable events for this encounter.  Last Vitals:  Vitals:   06/26/24 1305 06/26/24 1335  BP: 116/65 114/64  Pulse: 91 93  Resp: 19 (!) 22  Temp:    SpO2: 97% 93%    Last Pain:  Vitals:   06/26/24 1335  TempSrc:   PainSc: 0-No pain                 Rome Ade

## 2024-06-26 NOTE — Transfer of Care (Signed)
 Immediate Anesthesia Transfer of Care Note  Patient: Tina [name]  Procedure(s) Performed: ATRIAL FIBRILLATION ABLATION  Patient Location: PACU and Cath Lab  Anesthesia Type:General  Level of Consciousness: awake  Airway & Oxygen Therapy: Patient Spontanous Breathing and Patient connected to nasal cannula oxygen  Post-op Assessment: Report given to RN and Post -op Vital signs reviewed and stable  Post vital signs: Reviewed and stable  Last Vitals:  Vitals Value Taken Time  BP    Temp    Pulse 93 06/26/24 12:42  Resp 25 06/26/24 12:42  SpO2 96 % 06/26/24 12:42  Vitals shown include unfiled device data.  Last Pain:  Vitals:   06/26/24 1003  TempSrc: Oral  PainSc:          Complications: There were no known notable events for this encounter.

## 2024-06-26 NOTE — H&P (Signed)
° °  Electrophysiology Office Note:   Date:  06/26/2024  ID:  Tina [name], DOB 07-05-1951, MRN [id number]  Primary Cardiologist: Redell Leiter, MD Primary Heart Failure: None Electrophysiologist: Ayaat Jansma [name], MD      History of Present Illness:   Tina [name] is a 73 y.o. female with h/o atrial fibrillation, hyperlipidemia, syncope seen today for routine electrophysiology followup.   Today, denies symptoms of palpitations, chest pain, dyspnea, orthopnea, PND, lower extremity edema, claudication, dizziness, presyncope, syncope, bleeding, or neurologic sequela. The patient is tolerating medications without difficulties. Plan ablation today.   EP Information / Studies Reviewed:    EKG is not ordered today. EKG from 02/05/2024 reviewed which showed sinus rhythm, right bundle branch block        Risk Assessment/Calculations:    CHA2DS2-VASc Score = 3   This indicates a 3.2% annual risk of stroke. The patient's score is based upon: CHF History: 0 HTN History: 1 Diabetes History: 0 Stroke History: 0 Vascular Disease History: 0 Age Score: 1 Gender Score: 1            Physical Exam:   VS:  BP 114/72 (BP Location: Right Arm)   Pulse 65   Temp 98.2 F (36.8 C) (Oral)   Resp 18   Ht 5' 3 (1.6 m)   Wt 64.4 kg   SpO2 95%   BMI 25.15 kg/m    Wt Readings from Last 3 Encounters:  06/26/24 64.4 kg  06/04/24 66 kg  03/10/24 67.1 kg    GEN: Well nourished, well developed in no acute distress NECK: No JVD; No carotid bruits CARDIAC: Regular rate and rhythm, no murmurs, rubs, gallops RESPIRATORY:  Clear to auscultation without rales, wheezing or rhonchi  ABDOMEN: Soft, non-tender, non-distended EXTREMITIES:  No edema; No deformity    ASSESSMENT AND PLAN:    Syncope s/p [employer] Loop recorder Normal device function See Pace Art report No changes today  2.  Paroxysmal atrial fibrillation: Tina [name] has presented today for surgery, with the diagnosis of AF.   The various methods of treatment have been discussed with the patient and family. After consideration of risks, benefits and other options for treatment, the patient has consented to  Procedure(s): Catheter ablation as a surgical intervention .  Risks include but not limited to complete heart block, stroke, esophageal damage, nerve damage, bleeding, vascular damage, tamponade, perforation, MI, and death. The patient's history has been reviewed, patient examined, no change in status, stable for surgery.  I have reviewed the patient's chart and labs.  Questions were answered to the patient's satisfaction.    Soyla Norton, MD 06/26/2024 10:23 AM  Jeralynn Vaquera [name], MD

## 2024-06-26 NOTE — Progress Notes (Signed)
 Patient and patient husband given discharge instructions, education provided no further questions at this time. Patient able to ambulate and void before discharge. Able to tolerate PO intake. Patient sites are clean, dry, intact with no hematoma noted upon discharge. MD Camnitz at bedside to see pt. Educated when to restart Eliquis .

## 2024-06-26 NOTE — Anesthesia Procedure Notes (Signed)
 Procedure Name: Intubation Date/Time: 06/26/2024 11:12 AM  Performed by: Virgil Ee, CRNAPre-anesthesia Checklist: Patient identified, Patient being monitored, Timeout performed, Emergency Drugs available and Suction available Patient Re-evaluated:Patient Re-evaluated prior to induction Oxygen Delivery Method: Circle system utilized Preoxygenation: Pre-oxygenation with 100% oxygen Induction Type: IV induction Ventilation: Mask ventilation without difficulty Laryngoscope Size: Mac and 3 Grade View: Grade I Tube type: Oral Tube size: 6.5 mm Number of attempts: 1 Airway Equipment and Method: Stylet Placement Confirmation: ETT inserted through vocal cords under direct vision, positive ETCO2 and breath sounds checked- equal and bilateral Secured at: 21 cm Tube secured with: Tape Dental Injury: Teeth and Oropharynx as per pre-operative assessment

## 2024-06-26 NOTE — Anesthesia Preprocedure Evaluation (Addendum)
 Anesthesia Evaluation  Patient identified by MRN, date of birth, ID band Patient awake    Reviewed: Allergy & Precautions, H&P , NPO status , Patient's Chart, lab work & pertinent test results, reviewed documented beta blocker date and time   History of Anesthesia Complications (+) PONV, PROLONGED EMERGENCE and history of anesthetic complications  Airway Mallampati: III  TM Distance: >3 FB Neck ROM: Full    Dental no notable dental hx. (+) Teeth Intact, Dental Advisory Given   Pulmonary neg sleep apnea, COPD,  COPD inhaler, Patient abstained from smoking.Not current smoker   Pulmonary exam normal breath sounds clear to auscultation       Cardiovascular Exercise Tolerance: Good METS(-) hypertension(-) CAD and (-) Past MI + dysrhythmias Atrial Fibrillation and Supra Ventricular Tachycardia  Rhythm:Irregular Rate:Normal - Systolic murmurs  1. Large cyctic lesion noted most likely in the liver. Abdominal CT or US   of the liver should be considered. Left ventricular ejection fraction, by  estimation, is 50 to 55%. The left ventricle has low normal function. The  left ventricle has no regional  wall motion abnormalities. Left ventricular diastolic parameters are  consistent with Grade I diastolic dysfunction (impaired relaxation). The  average left ventricular global longitudinal strain is -15.2 %. The global  longitudinal strain is abnormal.   2. Right ventricular systolic function is normal. The right ventricular  size is normal. There is normal pulmonary artery systolic pressure.   3. Left atrial size was moderately dilated.   4. The mitral valve is normal in structure. Mild mitral valve  regurgitation. No evidence of mitral stenosis.   5. The aortic valve is normal in structure. Aortic valve regurgitation is  mild. No aortic stenosis is present.   6. The inferior vena cava is normal in size with greater than 50%  respiratory  variability, suggesting right atrial pressure of 3 mmHg     Neuro/Psych  PSYCHIATRIC DISORDERS Anxiety Depression    negative neurological ROS     GI/Hepatic Neg liver ROS, hiatal hernia,GERD  Medicated,,  Endo/Other  diabetes    Renal/GU negative Renal ROS  negative genitourinary   Musculoskeletal  (+) Arthritis , Osteoarthritis,    Abdominal   Peds  Hematology negative hematology ROS (+)   Anesthesia Other Findings Past Medical History: 10/2016: Abnormal mammogram of right breast 09/26/2018: Allergy to pollen 10/20/2020: Anxiety state No date: Aortic atherosclerosis No date: Arthritis     Comment:  right Thumb 04/28/2020: Atrophic vaginitis 10/31/2023: Breast cancer in situ     Comment:  Biopsy   No date: Cancer [facility]) No date: Chest pain in adult     Comment:  Normal MPS 2016 09/09/2018: Chronic anticoagulation 04/14/2021: Chronic obstructive lung disease (HCC) No date: Complication of anesthesia     Comment:  prolonged sedation No date: Dental crowns present 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/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 No date: Dupuytren contracture     Comment:  Bilateral hands, mild No date: GERD (gastroesophageal reflux disease) No date: Gestational diabetes     Comment:  history of   10/20/2020: Globus sensation 05/09/2017: Grade I diastolic dysfunction     Comment:  on ECHO No date: Heart murmur 10/13/2010: Heart palpitations     Comment:  Overview:  Last Assessment & Plan:  Long hx of same -               controlled with beta-blocker  ?orthostatic symptoms in AM  with low normal BP - pt will monitor to see if symptoms               correlate with BP reading No changes recommended today -               reassured re: cards recommendation to take extra 1/2 tab               prn symptoms  10/20/2020: Heartburn No date: Hepatitis     Comment:  age 16 infectious   12/21/2023: Hepatomegaly 09/09/2018: High risk medication use 1982: History  of blood transfusion 04/14/2017: History of cardiomegaly     Comment:  Noted on CXR No date: History of hepatitis as a child     Comment:  hepatitis A at age 53; infectious, per pt. No date: History of hiatal hernia No date: History of pleurisy No date: History of pneumonia No date: Hypercholesterolemia     Comment:  Overview:  Last Assessment & Plan:  On statin Well               controlled last check, monitor annually The current               medical regimen is effective;  continue present plan and               medications.  No date: Hyperlipidemia No date: Irregular heartbeat     Comment:  since age 65, per pt. 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, post-menopausal     Comment:  hx RLE fib fx 03/2012 following fall DEXA 03/13/13 @ [facility]:               -2.5 at spine  09/04/2018: PAF (paroxysmal atrial fibrillation) (HCC) 01/10/2023: Pain in left foot 08/24/2014: Pleuritic chest pain 04/17/2017: Pleuritis No date: PONV (postoperative nausea and vomiting) 02/01/2011: Postural dizziness 04/19/2021: Pruritus of vulva 04/18/2017: Pulmonary nodule     Comment:  5 mm left lower lobe nodule  09/26/2018: Right thyroid  nodule No date: Seasonal allergies 04/26/2020: Sensorineural hearing loss (SNHL) of both ears No date: SUI (stress urinary incontinence, female) No date: SVT (supraventricular tachycardia) 08/21/2018: Syncope 04/26/2020: Tinnitus of both ears 05/09/2017: TR (tricuspid regurgitation)     Comment:  Mild, noted on ECHO 04/28/2020: Urinary tract infectious disease 11/06/2022: Vestibular  neuritis No date: Wears glasses  Reproductive/Obstetrics negative OB ROS                              Anesthesia Physical Anesthesia Plan  ASA: 3  Anesthesia Plan: General   Post-op Pain Management: Minimal or no pain anticipated   Induction: Intravenous  PONV Risk Score and Plan: 4 or greater and Ondansetron , Dexamethasone , Propofol  infusion, TIVA and Treatment may vary due to age or medical condition  Airway Management Planned: Oral ETT  Additional Equipment: None  Intra-op Plan:   Post-operative Plan: Extubation in OR  Informed Consent: I have reviewed the patients History and Physical, chart, labs and  discussed the procedure including the risks, benefits and alternatives for the proposed anesthesia with the patient or authorized representative who has indicated his/her understanding and acceptance.     Dental advisory given  Plan Discussed with: CRNA  Anesthesia Plan Comments: (Discussed risks of anesthesia with patient, including PONV, sore throat, lip/dental/eye damage, post operative cognitive dysfunction.. Rare risks discussed as well, such as cardiorespiratory and neurological sequelae, and allergic reactions. Discussed the role of CRNA in patient's perioperative care. Patient understands.)         Anesthesia Quick Evaluation

## 2024-06-27 ENCOUNTER — Encounter (HOSPITAL_COMMUNITY): Payer: Self-pay | Admitting: Cardiology

## 2024-06-27 ENCOUNTER — Telehealth (HOSPITAL_COMMUNITY): Payer: Self-pay

## 2024-06-27 MED FILL — Fentanyl Citrate Preservative Free (PF) Inj 100 MCG/2ML: INTRAMUSCULAR | Qty: 1 | Status: AC

## 2024-06-27 NOTE — Telephone Encounter (Signed)
 Spoke with patient to complete post procedure follow up call.  Patient reports no complications with groin sites.   Instructions reviewed with patient:  Remove large bandage at puncture site after 24 hours. It is normal to have bruising, tenderness, mild swelling, and a pea or marble sized lump/knot at the groin site which can take up to three months to resolve.  Get help right away if you notice sudden swelling at the puncture site.  Check your puncture site every day for signs of infection: fever, redness, swelling, pus drainage, warmth, foul odor or excessive pain. If this occurs, please call [phone], to speak with the RN Navigator. Get help right away if your puncture site is bleeding and the bleeding does not stop after applying firm pressure to the area.  You may continue to have skipped beats/ atrial fibrillation during the first several months after your procedure.  It is very important not to miss any doses of your blood thinner Eliquis .    You will follow up with the Afib clinic 4 weeks after your procedure and follow up with the Afib clinic 3 months after your procedure.  Activity restrictions reviewed.  Patient verbalized understanding to all instructions provided.

## 2024-06-30 ENCOUNTER — Encounter: Payer: Self-pay | Admitting: Cardiology

## 2024-06-30 ENCOUNTER — Ambulatory Visit (HOSPITAL_BASED_OUTPATIENT_CLINIC_OR_DEPARTMENT_OTHER)
Admission: RE | Admit: 2024-06-30 | Discharge: 2024-06-30 | Disposition: A | Discharge status: Home / Self Care | Source: Home / Self Care

## 2024-06-30 ENCOUNTER — Ambulatory Visit (HOSPITAL_BASED_OUTPATIENT_CLINIC_OR_DEPARTMENT_OTHER): Admit: 2024-06-30 | Discharge: 2024-06-30 | Disposition: A | Admitting: Radiology

## 2024-06-30 VITALS — BP 127/85 | HR 84 | Temp 99.9°F | Resp 20

## 2024-06-30 DIAGNOSIS — R051 Acute cough: Secondary | ICD-10-CM | POA: Diagnosis not present

## 2024-06-30 LAB — POCT INFLUENZA A/B
Influenza A, POC: NEGATIVE
Influenza B, POC: NEGATIVE

## 2024-06-30 NOTE — Discharge Instructions (Signed)
 No concerns on your x-ray today.  Recommend Mucinex for congestion. Also fluids.  Follow-up as needed

## 2024-06-30 NOTE — ED Triage Notes (Signed)
 Patient had an ablation done on 12/11 for afib. States has had coughing, congestion since the day before her procedure. Cough and congestion has gotten worse. Patient concerned she could be damaging herself post procedure. She said she was told to come to urgent care for evaluation. Patient also has bruising to left leg and low back pain. Patient states gets pneumonia easily.

## 2024-06-30 NOTE — Telephone Encounter (Signed)
 PT calling to check status of message

## 2024-06-30 NOTE — ED Provider Notes (Signed)
 [facility]    CSN: [id number] Arrival date & time: 06/30/24  1802      History   Chief Complaint Chief Complaint  Patient presents with   Nasal Congestion    Nasal congestion plus chest congestion. I get pneumonia often. - Entered by patient   Cough    HPI Tina [name] is a 73 y.o. female.   Pt is a 73 year old female that presents with cough. Patient had an ablation done on 12/11 for afib. States has had coughing, congestion since the day before her procedure. Cough and congestion has gotten worse. Patient concerned she could be damaging herself post procedure. She said she was told to come to urgent care for evaluation. Patient also has bruising to left leg and low back pain. Patient states gets pneumonia easily.    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   Dupuytren contracture    Bilateral hands, mild   GERD (gastroesophageal reflux disease)    Gestational diabetes    history of     Globus sensation 10/20/2020   Grade I diastolic dysfunction 05/09/2017   on ECHO   Heart murmur    Heart palpitations 10/13/2010   Overview:  Last Assessment & Plan:  Long hx of same - controlled with beta-blocker  ?orthostatic symptoms in AM with low normal BP - pt will monitor to see if symptoms correlate with BP reading No changes recommended today - reassured re: cards recommendation to take extra 1/2 tab prn symptoms    Heartburn 10/20/2020    Hepatitis    age 72 infectious     Hepatomegaly 12/21/2023   High risk medication use 09/09/2018   History of blood transfusion 1982   History of cardiomegaly 04/14/2017   Noted on CXR   History of hepatitis as a child    hepatitis A at age 13; infectious, per pt.   History of hiatal hernia    History of pleurisy    History of pneumonia    Hypercholesterolemia    Overview:  Last Assessment & Plan:  On statin Well controlled last check, monitor annually The current medical regimen is effective;  continue present plan and medications.    Hyperlipidemia    Irregular heartbeat    since age 72, per pt.   Itchy eyes 09/15/2019   Limited joint range of motion    cervical spine - difficulty looking up for extended period   Low back pain 08/03/2021   MR (mitral regurgitation) 05/09/2017   Mild, noted on ECHO   MVP (mitral valve prolapse)    Mild   Myalgia due to statin 03/07/2018   Orthostatic hypotension    Osteoporosis    Osteoporosis, post-menopausal    hx RLE fib fx 03/2012 following fall DEXA 03/13/13 @ [facility]: -2.5 at spine    PAF (paroxysmal atrial fibrillation) (HCC) 09/04/2018  Pain in left foot 01/10/2023   Pleuritic chest pain 08/24/2014   Pleuritis 04/17/2017   PONV (postoperative nausea and vomiting)    Postural dizziness 02/01/2011   Pruritus of vulva 04/19/2021   Pulmonary nodule 04/18/2017   5 mm left lower lobe nodule    Right thyroid  nodule 09/26/2018   Seasonal allergies    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   SUI (stress urinary incontinence, female)    SVT (supraventricular tachycardia)    Syncope 08/21/2018   Tinnitus of both ears 04/26/2020   TR (tricuspid regurgitation) 05/09/2017   Mild, noted on ECHO   Urinary tract infectious disease 04/28/2020   Vestibular neuritis 11/06/2022   Wears glasses     Patient Active Problem List   Diagnosis Date Noted   Hepatic cyst 06/14/2024   Genetic testing 02/06/2024   Heart murmur     Hepatomegaly 12/21/2023    Class: Chronic   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   Breast cancer in situ 10/31/2023   Pain in left foot 01/10/2023   Vestibular neuritis 11/06/2022   Low back pain 08/03/2021   Pruritus of vulva 04/19/2021   Chronic obstructive lung disease (HCC) 04/14/2021   Anxiety state 10/20/2020   Globus sensation 10/20/2020   Heartburn 10/20/2020   Hypercholesterolemia    Atrophic vaginitis 04/28/2020   Urinary tract infectious disease 04/28/2020   Wears glasses    SUI (stress urinary incontinence, female)    Seasonal allergies    PONV (postoperative nausea and vomiting)    Osteoporosis    Orthostatic hypotension    MVP (mitral valve prolapse)    Limited joint range of motion    Irregular heartbeat    Hyperlipidemia    History of pneumonia    History of pleurisy    History of hiatal hernia    History of hepatitis as a child    Hepatitis    Gestational diabetes    Dupuytren contracture    Dental crowns present    Complication of anesthesia    Chest pain in adult    Arthritis    Aortic atherosclerosis    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   Tinnitus of both ears 04/26/2020   Itchy eyes 09/15/2019   Allergy to pollen 09/26/2018   Right thyroid  nodule 09/26/2018   High risk medication use 09/09/2018   Chronic anticoagulation 09/09/2018   PAF (paroxysmal atrial fibrillation) (HCC) 09/04/2018   Syncope 08/21/2018   Myalgia due to statin 03/07/2018   TR (tricuspid regurgitation) 05/09/2017   MR (mitral regurgitation) 05/09/2017   Grade I diastolic dysfunction 05/09/2017   Pulmonary nodule 04/18/2017   Pleuritis 04/17/2017   History of cardiomegaly 04/14/2017   Abnormal mammogram of right breast 10/2016   SVT (supraventricular tachycardia) 02/26/2016   Pleuritic chest pain 08/24/2014   Osteoporosis, post-menopausal    Postural dizziness 02/01/2011   Heart palpitations 10/13/2010   GERD  (gastroesophageal reflux disease)    History of blood transfusion 1982    Past Surgical History:  Procedure Laterality Date   ANTERIOR CERVICAL DECOMP/DISCECTOMY FUSION  02/03/2002   C5-6   ATRIAL FIBRILLATION ABLATION N/A 06/26/2024   Procedure: ATRIAL FIBRILLATION ABLATION;  Surgeon: Inocencio Soyla Lunger, MD;  Location: [facility];  Service: Cardiovascular;  Laterality: N/A;   BIOPSY  08/08/2022   Procedure: BIOPSY;  Surgeon: Dianna Specking, MD;  Location: [facility];  Service: Gastroenterology;;   BLADDER SUSPENSION N/A 04/26/2018   Procedure:  TRANSVAGINAL TAPE (TVT) PROCEDURE;  Surgeon: Sarrah Browning, MD;  Location: [facility];  Service: Gynecology;  Laterality: N/A;   BREAST BIOPSY Right 11/07/2023   MM RT BREAST BX W LOC DEV 1ST LESION IMAGE BX SPEC STEREO GUIDE 11/07/2023 [facility]   BREAST BIOPSY  12/04/2023   MM RT RADIOACTIVE SEED LOC MAMMO GUIDE 12/04/2023 [facility]   BREAST LUMPECTOMY WITH RADIOACTIVE SEED LOCALIZATION Right 12/05/2023   Procedure: BREAST LUMPECTOMY WITH RADIOACTIVE SEED LOCALIZATION;  Surgeon: Ebbie Cough, MD;  Location: [facility];  Service: General;  Laterality: Right;  RIGHT BREAST SEED GUIDED LUMPECTOMY   CATARACT EXTRACTION     CESAREAN SECTION     x 3   COLONOSCOPY  2013   several   COLONOSCOPY WITH PROPOFOL  N/A 08/08/2022   Procedure: COLONOSCOPY WITH PROPOFOL ;  Surgeon: Dianna Specking, MD;  Location: [facility];  Service: Gastroenterology;  Laterality: N/A;   CYSTOCELE REPAIR N/A 04/26/2018   Procedure: ANTERIOR REPAIR (CYSTOCELE);  Surgeon: Sarrah Browning, MD;  Location: [facility];  Service: Gynecology;  Laterality: N/A;   CYSTOSCOPY N/A 04/26/2018   Procedure: CYSTOSCOPY;  Surgeon: Sarrah Browning, MD;  Location: [facility];  Service: Gynecology;  Laterality: N/A;   INGUINAL HERNIA REPAIR Right    LAPAROSCOPIC NISSEN FUNDOPLICATION  07/07/2003   LOOP  RECORDER IMPLANT Left    NISSEN FUNDOPLICATION  08/14/2007   redo   RADIOACTIVE SEED GUIDED EXCISIONAL BREAST BIOPSY Right 11/08/2016   Procedure: RADIOACTIVE SEED GUIDED EXCISIONAL RIGHT BREAST BIOPSY;  Surgeon: Cough Ebbie, MD;  Location: [facility];  Service: General;  Laterality: Right;   TONSILLECTOMY  1977   TUBAL LIGATION     UPPER GI ENDOSCOPY     x2   WISDOM TOOTH EXTRACTION      OB History   No obstetric history on file.      Home Medications    Prior to Admission medications  Medication Sig Start Date End Date Taking? Authorizing Provider  acetaminophen  (TYLENOL ) 325 MG tablet Take 325 mg by mouth every 6 (six) hours as needed for pain or headache.    [provider]  AIRSUPRA 90-80 MCG/ACT AERO Inhale 2 puffs into the lungs daily. 02/22/24   [provider]  Ca Phosphate-Cholecalciferol (CALCIUM /VITAMIN D3 GUMMIES PO) Take 1 tablet by mouth daily. 1200 mg / 1000 units per gummy    [provider]  clobetasol ointment (TEMOVATE) 0.05 % Apply 1 Application topically daily. 06/05/24   [provider]  Coenzyme Q10 (COQ-10) 200 MG CAPS Take 200 mg by mouth in the morning.    [provider]  denosumab  (PROLIA ) 60 MG/ML SOSY injection Inject 60 mg into the skin every 6 (six) months.    [provider]  ELIQUIS  5 MG TABS tablet TAKE 1 TABLET BY MOUTH TWICE A DAY 01/30/24   Monetta Redell PARAS, MD  ezetimibe  (ZETIA ) 10 MG tablet TAKE 1/2 TABLET BY MOUTH DAILY 09/19/23   Munley, Brian J, MD  famotidine (PEPCID) 40 MG tablet Take 20 mg by mouth 2 (two) times daily.    [provider]  flecainide  (TAMBOCOR ) 50 MG tablet Take 1 tablet (50 mg total) by mouth 2 (two) times daily. 11/12/23   Carlin Delon BROCKS, NP  fluticasone  (FLONASE  ALLERGY RELIEF) 50 MCG/ACT nasal spray Place 1 spray into both nostrils daily.    [provider]  levocetirizine (XYZAL) 5 MG tablet Take 5 mg by mouth every evening.  [provider]  meclizine  (ANTIVERT ) 25 MG tablet Take 25 mg by mouth daily as needed for dizziness (enter ear problems).    [provider]  montelukast (SINGULAIR) 10 MG tablet Take 10 mg by mouth at bedtime.    [provider]  Omega-3 Fatty Acids (MEGARED ADVANCED OMEGA-3) 800 MG CAPS Take 800 mg by mouth daily.    [provider]  pravastatin  (PRAVACHOL ) 20 MG tablet TAKE 1 TABLET BY MOUTH EVERY DAY IN THE EVENING 03/15/21   Monetta Redell PARAS, MD  propranolol (INDERAL) 10 MG tablet Take 10 mg by mouth 2 (two) times daily. 09/03/21   [provider]  raloxifene  (EVISTA ) 60 MG tablet Take 1 tablet (60 mg total) by mouth daily. 03/04/24   Cornelius Wanda DEL, MD    Family History Family History  Problem Relation Age of Onset   Alzheimer's disease Mother    Alzheimer's disease Father    Prostate cancer Father 55 - 39   Hyperlipidemia Father    Heart disease Father    Congestive Heart Failure Father    Heart attack Father    Melanoma Father 74 - 59       ear/face   Brain cancer Maternal Aunt 60       glioblastoma   Melanoma Paternal Aunt 79       metastatic to lung, brain   Melanoma Paternal Aunt    Hemachromatosis Paternal Aunt    Heart disease Paternal Uncle    Heart attack Maternal Grandmother    Heart attack Maternal Grandfather    Stroke Paternal Grandmother    Heart attack Paternal Grandmother    Heart attack Paternal Grandfather     Social History Social History[1]   Allergies   Penicillins, Codeine, E.e.s. [erythromycin], Simvastatin, and Other   Review of Systems Review of Systems  Respiratory:  Positive for cough.      Physical Exam Triage Vital Signs ED Triage Vitals  Encounter Vitals Group     BP 06/30/24 1834 127/85     Girls Systolic BP Percentile --      Girls Diastolic BP Percentile --      Boys Systolic BP Percentile --      Boys Diastolic BP Percentile --      Pulse Rate 06/30/24 1834 84     Resp  06/30/24 1834 20     Temp 06/30/24 1834 99.9 F (37.7 C)     Temp Source 06/30/24 1834 Oral     SpO2 06/30/24 1834 95 %     Weight --      Height --      Head Circumference --      Peak Flow --      Pain Score 06/30/24 1835 7     Pain Loc --      Pain Education --      Exclude from Growth Chart --    No data found.  Updated Vital Signs BP 127/85 (BP Location: Right Arm)   Pulse 84   Temp 99.9 F (37.7 C) (Oral)   Resp 20   SpO2 95%   Visual Acuity Right Eye Distance:   Left Eye Distance:   Bilateral Distance:    Right Eye Near:   Left Eye Near:    Bilateral Near:     Physical Exam Constitutional:      General: She is not in acute distress.    Appearance: Normal appearance. She is not ill-appearing, toxic-appearing or diaphoretic.  HENT:  Head: Normocephalic and atraumatic.     Mouth/Throat:     Pharynx: Oropharynx is clear.  Eyes:     Conjunctiva/sclera: Conjunctivae normal.  Cardiovascular:     Rate and Rhythm: Normal rate and regular rhythm.     Pulses: Normal pulses.     Heart sounds: Normal heart sounds.  Pulmonary:     Effort: Pulmonary effort is normal.     Breath sounds: Normal breath sounds.  Skin:    General: Skin is warm and dry.  Neurological:     Mental Status: She is alert.  Psychiatric:        Mood and Affect: Mood normal.      UC Treatments / Results  Labs (all labs ordered are listed, but only abnormal results are displayed) Labs Reviewed  POCT INFLUENZA A/B - Normal    EKG   Radiology DG Chest 2 View Result Date: 06/30/2024 EXAM: 2 VIEW(S) XRAY OF THE CHEST 06/30/2024 06:54:47 PM COMPARISON: 04/14/2017 CLINICAL HISTORY: increasing cough and congestion. FINDINGS: LINES, TUBES AND DEVICES: Loop recorder in place. LUNGS AND PLEURA: No focal pulmonary opacity. No pleural effusion. No pneumothorax. HEART AND MEDIASTINUM: No acute abnormality of the cardiac and mediastinal silhouettes. BONES AND SOFT TISSUES: Cervical spinal  fusion hardware in place. No acute fracture. Left upper abdominal surgical clips. Small hiatal hernia is noted. IMPRESSION: 1. No acute cardiopulmonary process identified. Electronically signed by: Oneil Devonshire MD 06/30/2024 07:11 PM EST RP Workstation: [id number]    Procedures Procedures (including critical care time)  Medications Ordered in UC Medications - No data to display  Initial Impression / Assessment and Plan / UC Course  I have reviewed the triage vital signs and the nursing notes.  Pertinent labs & imaging results that were available during my care of the patient were reviewed by me and considered in my medical decision making (see chart for details).     Acute cough-no concerns on x-ray or exam.  Recommend Mucinex for congestion.  Push fluids.  Follow-up with cardiology as needed Final Clinical Impressions(s) / UC Diagnoses   Final diagnoses:  Acute cough     Discharge Instructions      No concerns on your x-ray today.  Recommend Mucinex for congestion. Also fluids.  Follow-up as needed     ED Prescriptions   None    PDMP not reviewed this encounter.     [1]  Social History Tobacco Use   Smoking status: Never   Smokeless tobacco: Never  Vaping Use   Vaping status: Never Used  Substance Use Topics   Alcohol use: Never   Drug use: Never     Adah Wilbert LABOR, FNP 07/01/24 [phone]

## 2024-07-02 ENCOUNTER — Encounter

## 2024-07-03 ENCOUNTER — Encounter

## 2024-07-04 ENCOUNTER — Ambulatory Visit

## 2024-07-04 DIAGNOSIS — I480 Paroxysmal atrial fibrillation: Secondary | ICD-10-CM | POA: Diagnosis not present

## 2024-07-04 LAB — CUP PACEART REMOTE DEVICE CHECK
Date Time Interrogation Session: 20251218231037
Implantable Pulse Generator Implant Date: 20210412

## 2024-07-07 NOTE — Progress Notes (Signed)
 Remote Loop Recorder Transmission

## 2024-07-11 ENCOUNTER — Ambulatory Visit: Payer: Self-pay | Admitting: Cardiology

## 2024-07-23 NOTE — Progress Notes (Signed)
 "  Primary Care Physician: Clemmie Nest, MD Primary Cardiologist: Redell Leiter, MD Electrophysiologist: Will [name], MD  Referring Physician: Soyla [name], MD   Tina [name] is a 74 y.o. female with a history of paroxysmal AF, RBBB, HLD, SVT, GERD, mild MR who presents for follow up in the [facility].  The patient was initially diagnosed with paroxysmal A-fib by Dr. Leiter on 09/04/2018 and was started on flecainide  50 mg twice daily as well as Eliquis .  She also endorsed multiple episodes of syncope and feeling nauseous prior to episodes.  She was seen initially by Dr. Norton on 10/27/2019 with loop recorder implanted due to paroxysmal AF and complaint of syncope.  She was continued on flecainide  however still experienced episodes of AF and was seen by Dr. Norton on 03/10/2024 with agreement to proceed with repeat ablation.  She completed her ablation procedure on 06/26/2024 and presents today for 4-week follow-up.  Tina [name] presents today for her 4-week post ablation follow-up.  She is in sinus rhythm today and reports no groin site abnormalities since her ablation.  She has been compliant with her anticoagulant and reports no missed doses.  She has also been compliant with her flecainide  and EKG completed today shows stable intervals.  During today's visit we discussed the 45-month post ablation period and the importance of compliance with anticoagulant due to possible return of AF.  She has resumed her normal activities and is planning to go back to the [facility] for exercise.  She has been having complaints of ocular migraines which are possibly related to her hormone therapy.  She was advised to follow-up with her prescribing provider to discuss the side effects.Today, she denies symptoms of palpitations, chest pain, shortness of breath, orthopnea, PND, lower extremity edema, dizziness, presyncope, syncope, snoring, daytime somnolence, bleeding, or neurologic  sequela. The patient is tolerating medications without difficulties and is otherwise without complaint today.    Atrial Fibrillation Management history: Patient was referred to [facility] sleep medicine to rule out sleep apnea Previous antiarrhythmic drugs: Flecainide  propafenone Previous cardioversions: None Previous ablations: 06/2024 Anticoagulation history: Eliquis   ROS- All systems are reviewed and negative except as per the HPI above.  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   Dupuytren contracture    Bilateral hands, mild   GERD (gastroesophageal reflux disease)    Gestational diabetes    history of     Globus sensation 10/20/2020   Grade I diastolic dysfunction 05/09/2017   on ECHO   Heart murmur    Heart palpitations 10/13/2010   Overview:  Last Assessment & Plan:  Long hx of same - controlled with beta-blocker  ?orthostatic symptoms in AM with low normal BP - pt will monitor to see if symptoms correlate with BP reading No changes recommended today - reassured re: cards recommendation to take extra 1/2 tab prn symptoms  Heartburn 10/20/2020   Hepatitis    age 14 infectious     Hepatomegaly 12/21/2023   High risk medication use 09/09/2018   History of blood transfusion 1982   History of cardiomegaly 04/14/2017   Noted on CXR   History of hepatitis as a child    hepatitis A at age 32; infectious, per pt.   History of hiatal hernia    History of  pleurisy    History of pneumonia    Hypercholesterolemia    Overview:  Last Assessment & Plan:  On statin Well controlled last check, monitor annually The current medical regimen is effective;  continue present plan and medications.    Hyperlipidemia    Irregular heartbeat    since age 43, per pt.   Itchy eyes 09/15/2019   Limited joint range of motion    cervical spine - difficulty looking up for extended period   Low back pain 08/03/2021   MR (mitral regurgitation) 05/09/2017   Mild, noted on ECHO   MVP (mitral valve prolapse)    Mild   Myalgia due to statin 03/07/2018   Orthostatic hypotension    Osteoporosis    Osteoporosis, post-menopausal    hx RLE fib fx 03/2012 following fall DEXA 03/13/13 @ [facility]: -2.5 at spine    PAF (paroxysmal atrial fibrillation) (HCC) 09/04/2018   Pain in left foot 01/10/2023   Pleuritic chest pain 08/24/2014   Pleuritis 04/17/2017   PONV (postoperative nausea and vomiting)    Postural dizziness 02/01/2011   Pruritus of vulva 04/19/2021   Pulmonary nodule 04/18/2017   5 mm left lower lobe nodule    Right thyroid  nodule 09/26/2018   Seasonal allergies    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   SUI (stress urinary incontinence, female)    SVT (supraventricular tachycardia)    Syncope 08/21/2018   Tinnitus of both ears 04/26/2020   TR (tricuspid regurgitation) 05/09/2017   Mild, noted on ECHO   Urinary tract infectious disease 04/28/2020   Vestibular neuritis 11/06/2022   Wears glasses     Current Outpatient Medications  Medication Sig Dispense Refill   acetaminophen  (TYLENOL ) 325 MG tablet Take 325 mg by mouth every 6 (six) hours as needed for pain or headache. (Patient taking differently: Take 325 mg by mouth as needed for pain or headache.)     AIRSUPRA 90-80 MCG/ACT AERO Inhale 2 puffs into the lungs daily.     Ca Phosphate-Cholecalciferol (CALCIUM /VITAMIN D3 GUMMIES PO) Take 1 tablet by mouth daily. 1200 mg / 1000 units  per gummy     clobetasol ointment (TEMOVATE) 0.05 % Apply 1 Application topically daily. (Patient taking differently: Apply 1 Application topically as needed.)     Coenzyme Q10 (COQ-10) 200 MG CAPS Take 200 mg by mouth in the morning.     denosumab  (PROLIA ) 60 MG/ML SOSY injection Inject 60 mg into the skin every 6 (six) months.     ELIQUIS  5 MG TABS tablet TAKE 1 TABLET BY MOUTH TWICE A DAY 180 tablet 1   ezetimibe  (ZETIA ) 10 MG tablet TAKE 1/2 TABLET BY MOUTH DAILY 45 tablet 2   famotidine (PEPCID) 40 MG tablet Take 20 mg by mouth 2 (two) times daily.     flecainide  (TAMBOCOR ) 50 MG tablet Take 1 tablet (50 mg total) by mouth 2 (two) times daily. 180 tablet 2   fluticasone  (FLONASE  ALLERGY RELIEF) 50 MCG/ACT nasal spray Place 1 spray into both nostrils daily.     levocetirizine (XYZAL)  5 MG tablet Take 5 mg by mouth every evening.     meclizine  (ANTIVERT ) 25 MG tablet Take 25 mg by mouth daily as needed for dizziness (enter ear problems).     montelukast (SINGULAIR) 10 MG tablet Take 10 mg by mouth at bedtime.     Omega-3 Fatty Acids (MEGARED ADVANCED OMEGA-3) 800 MG CAPS Take 800 mg by mouth daily.     pravastatin  (PRAVACHOL ) 20 MG tablet TAKE 1 TABLET BY MOUTH EVERY DAY IN THE EVENING 90 tablet 2   propranolol (INDERAL) 10 MG tablet Take 10 mg by mouth 2 (two) times daily.     raloxifene  (EVISTA ) 60 MG tablet Take 1 tablet (60 mg total) by mouth daily. 30 tablet 5   No current facility-administered medications for this encounter.    Physical Exam: BP 126/78   Pulse 70   Ht 5' 3 (1.6 m)   Wt 65.9 kg   BMI 25.72 kg/m   GEN: Well nourished, well developed in no acute distress NECK: No JVD; No carotid bruits CARDIAC: Regular rate and rhythm, no murmurs, rubs, gallops RESPIRATORY:  Clear to auscultation without rales, wheezing or rhonchi  ABDOMEN: Soft, non-tender, non-distended EXTREMITIES:  No edema; No deformity   Wt Readings from Last 3 Encounters:  07/24/24 65.9 kg  06/26/24  64.4 kg  06/04/24 66 kg     EKG today demonstrates:   EKG Interpretation Date/Time:  Thursday July 24 2024 15:30:57 EST Ventricular Rate:  70 PR Interval:  176 QRS Duration:  138 QT Interval:  430 QTC Calculation: 464 R Axis:   60  Text Interpretation: Normal sinus rhythm Right bundle branch block Abnormal ECG When compared with ECG of 26-Jun-2024 12:55, QT has shortened Confirmed by Wyn Manus [phone]) on 07/24/2024 3:33:49 PM        Echo Completed 11/16/2023: 1. Large cyctic lesion noted most likely in the liver. Abdominal CT or US   of the liver should be considered. Left ventricular ejection fraction, by  estimation, is 50 to 55%. The left ventricle has low normal function. The  left ventricle has no regional  wall motion abnormalities. Left ventricular diastolic parameters are  consistent with Grade I diastolic dysfunction (impaired relaxation). The  average left ventricular global longitudinal strain is -15.2 %. The global  longitudinal strain is abnormal.   2. Right ventricular systolic function is normal. The right ventricular  size is normal. There is normal pulmonary artery systolic pressure.   3. Left atrial size was moderately dilated.   4. The mitral valve is normal in structure. Mild mitral valve  regurgitation. No evidence of mitral stenosis.   5. The aortic valve is normal in structure. Aortic valve regurgitation is  mild. No aortic stenosis is present.   6. The inferior vena cava is normal in size with greater than 50%  respiratory variability, suggesting right atrial pressure of 3 mmHg.   CHA2DS2-VASc Score = 3  The patient's score is based upon: CHF History: 0 HTN History: 1 Diabetes History: 0 Stroke History: 0 Vascular Disease History: 0 Age Score: 1 Gender Score: 1      ASSESSMENT AND PLAN: Paroxysmal Atrial Fibrillation (ICD10:  I48.0) The patient's CHA2DS2-VASc score is 3, indicating a 3.2% annual risk of stroke.   -s/p AF ablation  06/26/2024 and today is maintaining sinus rhythm and denies any groin site side effects. - We discussed the importance of medication compliance during the post 17-month period and also discussed discontinuation of her Flecanide at 3 months  if AF is well controlled. Continue-Eliquis  5 mg twice daily - Continue flecainide  50 mg twice daily and propranolol 10 mg twice daily  High Risk Medication Monitoring (ICD 10: Z79.899) Patient requires ongoing monitoring for anti-arrhythmic medication which has the potential to cause life threatening arrhythmias. Intervals on ECG acceptable for flecainide  monitoring.     Secondary Hypercoagulable State (ICD10:  D68.69) The patient is at significant risk for stroke/thromboembolism based upon her CHA2DS2-VASc Score of 3.  Continue Apixaban  (Eliquis ).   Signed,  Wyn Raddle, Jackee Shove, NP    07/24/2024 4:07 PM    Follow up with the AF Clinic in 3 months  "

## 2024-07-24 ENCOUNTER — Ambulatory Visit (HOSPITAL_COMMUNITY)
Admission: RE | Admit: 2024-07-24 | Discharge: 2024-07-24 | Disposition: A | Discharge status: Home / Self Care | Payer: Self-pay | Source: Ambulatory Visit | Attending: Nurse Practitioner | Admitting: Nurse Practitioner

## 2024-07-24 ENCOUNTER — Encounter (HOSPITAL_COMMUNITY): Payer: Self-pay | Admitting: Nurse Practitioner

## 2024-07-24 ENCOUNTER — Ambulatory Visit (HOSPITAL_COMMUNITY): Admitting: Nurse Practitioner

## 2024-07-24 VITALS — BP 126/78 | HR 70 | Ht 63.0 in | Wt 145.2 lb

## 2024-07-24 DIAGNOSIS — I480 Paroxysmal atrial fibrillation: Secondary | ICD-10-CM | POA: Diagnosis not present

## 2024-07-24 DIAGNOSIS — Z9229 Personal history of other drug therapy: Secondary | ICD-10-CM | POA: Diagnosis not present

## 2024-07-24 DIAGNOSIS — D6859 Other primary thrombophilia: Secondary | ICD-10-CM

## 2024-07-24 DIAGNOSIS — I4891 Unspecified atrial fibrillation: Secondary | ICD-10-CM | POA: Diagnosis not present

## 2024-07-26 ENCOUNTER — Other Ambulatory Visit: Payer: Self-pay | Admitting: Cardiology

## 2024-07-26 DIAGNOSIS — I480 Paroxysmal atrial fibrillation: Secondary | ICD-10-CM

## 2024-07-28 ENCOUNTER — Encounter

## 2024-07-28 ENCOUNTER — Other Ambulatory Visit: Payer: Self-pay

## 2024-07-28 MED ORDER — FLECAINIDE ACETATE 50 MG PO TABS: 50.0000 mg | ORAL_TABLET | Freq: Two times a day (BID) | ORAL | 0 refills | Status: AC

## 2024-08-02 ENCOUNTER — Encounter

## 2024-08-04 ENCOUNTER — Ambulatory Visit: Attending: Cardiology

## 2024-08-04 ENCOUNTER — Encounter

## 2024-08-04 DIAGNOSIS — I480 Paroxysmal atrial fibrillation: Secondary | ICD-10-CM | POA: Diagnosis not present

## 2024-08-05 ENCOUNTER — Ambulatory Visit: Payer: Self-pay | Admitting: Cardiology

## 2024-08-05 LAB — CUP PACEART REMOTE DEVICE CHECK
Date Time Interrogation Session: 20260118230302
Implantable Pulse Generator Implant Date: 20210412

## 2024-08-06 DIAGNOSIS — C801 Malignant (primary) neoplasm, unspecified: Secondary | ICD-10-CM | POA: Insufficient documentation

## 2024-08-08 ENCOUNTER — Ambulatory Visit

## 2024-08-08 ENCOUNTER — Ambulatory Visit: Admitting: Cardiology

## 2024-08-08 VITALS — BP 120/76 | HR 68 | Ht 62.0 in | Wt 148.0 lb

## 2024-08-08 DIAGNOSIS — E7800 Pure hypercholesterolemia, unspecified: Secondary | ICD-10-CM | POA: Diagnosis not present

## 2024-08-08 DIAGNOSIS — I480 Paroxysmal atrial fibrillation: Secondary | ICD-10-CM | POA: Diagnosis not present

## 2024-08-08 DIAGNOSIS — I340 Nonrheumatic mitral (valve) insufficiency: Secondary | ICD-10-CM | POA: Diagnosis not present

## 2024-08-08 NOTE — Assessment & Plan Note (Signed)
 Reassess with echocardiogram tentatively in May 2027.

## 2024-08-08 NOTE — Patient Instructions (Signed)

## 2024-08-08 NOTE — Assessment & Plan Note (Signed)
 Last lipid panel is from February 2025.  Continue pravastatin  20 mg once daily and Zetia  10 mg once daily. She does have fish oil at home but typically finds them to be big for ingestion and does not take them consistently.  Okay to hold off on fish oil for now.

## 2024-08-08 NOTE — Progress Notes (Signed)
 "  Cardiology Consultation:    Date:  08/08/2024   ID:  [name], DOB [birth year], MRN [id number]  PCP:  Clemmie Nest, MD  Cardiologist:  Alean SAUNDERS Noeh Sparacino, MD   Referring MD: Clemmie Nest, MD   No chief complaint on file.    ASSESSMENT AND PLAN:   Tina [name] 74 year old woman with history of paroxysmal atrial fibrillation [previously on rhythm control with flecainide  since February 2020; s/p ablation 06/26/2024 with Dr. Camnitz] and anticoagulation with Eliquis  , SVT, implantable loop recorder in place, mitral regurgitation, chronic RBBB noted since 2020, dyslipidemia, COPD, recent echocardiogram 11-16-2023 notes LVEF 50 to 55%, grade 1 diastolic dysfunction, moderately dilated left atrium, mild MR, mild aortic insufficiency. CT imaging and subsequently  right upper quadrant ultrasound at PCPs office 11/23/2023 reporting hepatic cysts and a simple right renal cyst.  Following up in this regard with her PCP.  Also recently diagnosed with right breast cancer s/p lumpectomy and radiation sessions and initiated on raloxifene  in August 2025 follows up with Dr. Cornelius.   Here for follow-up visit.  Recently had A-fib ablation done and followed up with A-fib clinic.  Has follow-up visit again in March.  Problem List Items Addressed This Visit       Cardiovascular and Mediastinum   PAF (paroxysmal atrial fibrillation) (HCC) - Primary   CHADS2 VASc score 2. On anticoagulation with Eliquis  5 mg twice daily. Continue same.  On rhythm control with flecainide  50 mg twice daily and propranolol 10 mg twice daily. Now s/p A-fib ablation 06/26/2024. Currently remains on flecainide  with plans to stop it 3 months post ablation. Remains in sinus rhythm by EKG today. Good symptom control, she denies any palpitations.  Continue follow-up with A-fib clinic as scheduled in March.       MR (mitral regurgitation)   Reassess with echocardiogram tentatively in May 2027.        Other    Hypercholesterolemia   Last lipid panel is from February 2025.  Continue pravastatin  20 mg once daily and Zetia  10 mg once daily. She does have fish oil at home but typically finds them to be big for ingestion and does not take them consistently.  Okay to hold off on fish oil for now.      Relevant Orders   EKG 12-Lead (Completed)    Return to clinic in 6 months.  History of Present Illness:    Tina [name] is a 74 y.o. female who is being seen today for follow-up visit. PCP is Clemmie Nest, MD. Last visit with me in Tina office was 02/05/2024.  Has history of  paroxysmal atrial fibrillation [previously on rhythm control with flecainide  since February 2020; s/p ablation 08/28/2023 with Dr. Camnitz] and anticoagulation with Eliquis  , SVT, implantable loop recorder in place, mitral regurgitation, chronic RBBB noted since 2020, dyslipidemia, COPD, recent echocardiogram 11-16-2023 notes LVEF 50 to 55%, grade 1 diastolic dysfunction, moderately dilated left atrium, mild MR, mild aortic insufficiency. CT imaging and subsequently  right upper quadrant ultrasound at PCPs office 11/23/2023 reporting hepatic cysts and a simple right renal cyst.  Following up in this regard with her PCP.  Also recently diagnosed with right breast cancer s/p lumpectomy and radiation sessions and initiated on raloxifene  in August 2025 follows up with Dr. Cornelius.  Last device check Loop recorder 08/03/2024 noted normal device function and no new symptoms and no tacky bradycardia or pauses.  No new A-fib episodes.  Pleasant woman here for Tina visit today by  herself. Mentions overall doing well. No symptoms of palpitations. Has started going back to [place] and doing aquatic exercises and walking. No symptoms of chest pain, shortness of breath, orthopnea paroxysmal nocturnal dyspnea.  No pedal edema. Has mild symptoms of eye pain which she wonders if it is related to her new medication raloxifene .  Good compliance with  her medication.  EKG in Tina clinic today shows sinus rhythm heart rate 68/min, PR interval 164 ms, QRS duration 142 ms RBBB morphology, QTc 459 ms, no ischemia.  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 No pathogenic variants identified on [facility] CancerNext-Expanded+RNA Panel. Tina report date is 02/02/2024.     Tina CancerNext-Expanded gene panel offered by [facility] and includes sequencing, rearrangement, and RNA analysis for Tina following 77 genes: AIP, ALK, APC, ATM, AXIN2, BAP1, BARD1, BMPR1A, BRCA1, BRCA2, BRIP1, CDC73, CDH1, CDK4, CDKN1B, CDKN2A, CEBPA, CHEK2   GERD (gastroesophageal reflux disease)    Gestational diabetes    history of     Globus sensation 10/20/2020   Grade I diastolic dysfunction 05/09/2017   on ECHO   Heart murmur    Heart palpitations 10/13/2010   Overview:  Last Assessment & Plan:  Long hx of same - controlled with beta-blocker  ?orthostatic symptoms in AM with low normal BP - pt will monitor to see if symptoms correlate with BP reading No changes recommended today - reassured re: cards recommendation to take extra 1/2 tab prn symptoms    Heartburn 10/20/2020   Hepatic cyst 06/14/2024   Hepatitis    age 39 infectious     Hepatomegaly 12/21/2023   High risk medication use 09/09/2018   History of blood transfusion 1982   History  of cardiomegaly 04/14/2017   Noted on CXR   History of hepatitis as a child    hepatitis A at age 80; infectious, per pt.   History of hiatal hernia    History of pleurisy    History of pneumonia    Hypercholesterolemia    Overview:  Last Assessment & Plan:  On statin Well controlled last check, monitor annually Tina current medical regimen is effective;  continue present plan and medications.    Hyperlipidemia    Irregular heartbeat    since age 33, per pt.   Itchy eyes 09/15/2019   Limited joint range of motion    cervical spine - difficulty looking up for extended period   Low back pain 08/03/2021   MR (mitral regurgitation) 05/09/2017   Mild, noted on ECHO   MVP (mitral valve prolapse)    Mild   Myalgia due to statin 03/07/2018   Orthostatic hypotension    Osteoporosis    Osteoporosis, post-menopausal    hx RLE fib fx 03/2012 following fall DEXA 03/13/13 @  LB: -2.5 at spine    PAF (paroxysmal atrial fibrillation) (HCC) 09/04/2018   Pain in left foot 01/10/2023   Pleuritic chest pain 08/24/2014   Pleuritis 04/17/2017   PONV (postoperative nausea and vomiting)    Postural dizziness 02/01/2011   Pruritus of vulva 04/19/2021   Pulmonary nodule 04/18/2017   5 mm left lower lobe nodule    Right thyroid  nodule 09/26/2018   Seasonal allergies    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   SUI (stress urinary incontinence, female)    SVT (supraventricular tachycardia)    Syncope 08/21/2018   Tinnitus of both ears 04/26/2020   TR (tricuspid regurgitation) 05/09/2017   Mild, noted on ECHO   Urinary tract infectious disease 04/28/2020   Vestibular neuritis 11/06/2022   Wears glasses     Past Surgical History:  Procedure Laterality Date   ANTERIOR CERVICAL DECOMP/DISCECTOMY FUSION  02/03/2002   C5-6   ATRIAL FIBRILLATION ABLATION N/A 06/26/2024   Procedure: ATRIAL FIBRILLATION ABLATION;  Surgeon: Inocencio Soyla Lunger, MD;  Location: [facility];   Service: Cardiovascular;  Laterality: N/A;   BIOPSY  08/08/2022   Procedure: BIOPSY;  Surgeon: Dianna Specking, MD;  Location: [facility];  Service: Gastroenterology;;   BLADDER SUSPENSION N/A 04/26/2018   Procedure: TRANSVAGINAL TAPE (TVT) PROCEDURE;  Surgeon: Sarrah Browning, MD;  Location[facility];  Service: Gynecology;  Laterality: N/A;   BREAST BIOPSY Right 11/07/2023   MM RT BREAST BX W LOC DEV 1ST LESION IMAGE BX SPEC STEREO GUIDE 11/07/2023 [facility]   BREAST BIOPSY  12/04/2023   MM RT RADIOACTIVE SEED LOC MAMMO GUIDE 12/04/2023 [facility]   BREAST LUMPECTOMY WITH RADIOACTIVE SEED LOCALIZATION Right 12/05/2023   Procedure: BREAST LUMPECTOMY WITH RADIOACTIVE SEED LOCALIZATION;  Surgeon: Ebbie Cough, MD;  Location: [facility];  Service: General;  Laterality: Right;  RIGHT BREAST SEED GUIDED LUMPECTOMY   CATARACT EXTRACTION     CESAREAN SECTION     x 3   COLONOSCOPY  2013   several   COLONOSCOPY WITH PROPOFOL  N/A 08/08/2022   Procedure: COLONOSCOPY WITH PROPOFOL ;  Surgeon: Dianna Specking, MD;  Location: [facility];  Service: Gastroenterology;  Laterality: N/A;   CYSTOCELE REPAIR N/A 04/26/2018   Procedure: ANTERIOR REPAIR (CYSTOCELE);  Surgeon: Sarrah Browning, MD;  Location[facility];  Service: Gynecology;  Laterality: N/A;   CYSTOSCOPY N/A 04/26/2018   Procedure: CYSTOSCOPY;  Surgeon: Sarrah Browning, MD;  Location: [facility];  Service: Gynecology;  Laterality: N/A;   INGUINAL HERNIA REPAIR Right    LAPAROSCOPIC NISSEN FUNDOPLICATION  07/07/2003   LOOP RECORDER IMPLANT Left    NISSEN FUNDOPLICATION  08/14/2007   redo   RADIOACTIVE SEED GUIDED EXCISIONAL BREAST BIOPSY Right 11/08/2016   Procedure: RADIOACTIVE SEED GUIDED EXCISIONAL RIGHT BREAST BIOPSY;  Surgeon: Cough Ebbie, MD;  Location: [facility];  Service: General;  Laterality: Right;   TONSILLECTOMY  1977   TUBAL LIGATION      UPPER GI ENDOSCOPY     x2   WISDOM TOOTH EXTRACTION      Current Medications: Active Medications[1]   Allergies:   Penicillins, Codeine, E.e.s. 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 Married, lives with spouse -   Social Drivers of Health   Tobacco Use: Low Risk (08/08/2024)   [name] History    Smoking Tobacco Use: Never    Smokeless Tobacco Use: Never    Passive Exposure: Not on file  Financial Resource Strain: Not on file  Food Insecurity: No Food Insecurity (11/21/2023)   Hunger Vital Sign    Worried About Running Out of Food in [place] Last Year: Never true    Ran Out of Food in Tina Last Year: Never true  Transportation Needs: No Transportation Needs (11/21/2023)   PRAPARE - Administrator, Civil Service (Medical): No    Lack of Transportation (Non-Medical): No  Physical Activity: Not on file  Stress: No Stress Concern Present (11/21/2023)   [employer] of Occupational Health - Occupational Stress Questionnaire    Feeling of Stress : Only a little  Recent Concern: Stress - Stress Concern Present (11/21/2023)   [employer] of Occupational Health - Occupational Stress Questionnaire    Feeling of Stress : To some extent  Social Connections: Not on file  Depression (PHQ2-9): Low Risk (06/04/2024)   Depression (PHQ2-9)    PHQ-2 Score: 0  Alcohol Screen:  Not on file  Housing: Low Risk (11/21/2023)   Housing Stability Vital Sign    Unable to Pay for Housing in Tina Last Year: No    Number of Times Moved in Tina Last Year: 0    Homeless in Tina Last Year: No  Utilities: Not At Risk (11/21/2023)   AHC Utilities    Threatened with loss of utilities: No  Health Literacy: Not on file     Family History: Tina [name]'s family history includes Alzheimer's disease in her father and mother; Brain cancer (age of onset: 52) in her maternal aunt; Congestive Heart Failure in her father; Heart attack in her father, maternal grandfather, maternal grandmother, paternal grandfather, and paternal grandmother; Heart disease in her father and paternal uncle; Hemachromatosis in her paternal aunt; Hyperlipidemia in her father; Melanoma in her paternal aunt; Melanoma (age of onset: 39) in her paternal aunt; Melanoma (age of onset: 31 - 64) in her father; Prostate cancer (age of onset: 46 - 68) in her father; Stroke in her paternal grandmother. ROS:   Please see Tina history of present illness.    All 14 point review of systems negative except as described per history of present illness.  EKGs/Labs/Other Studies Reviewed:    Tina following studies were reviewed today:   EKG:  EKG Interpretation Date/Time:  Friday August 08 2024 10:07:39 EST Ventricular Rate:  68 PR Interval:  164 QRS Duration:  142 QT Interval:  432 QTC Calculation: 459 R Axis:   41  Text Interpretation: Normal sinus rhythm Right bundle branch block Abnormal ECG When compared with ECG of 24-Jul-2024 15:30, No significant change was found Confirmed by Liborio Hai reddy [phone]) on 08/08/2024 10:19:23 AM    Recent Labs: 05/29/2024: ALT 13; BUN 14; Creatinine 0.82; Hemoglobin 13.7; Platelet Count 193; Potassium 4.1; Sodium 141  Recent Lipid Panel    Component Value Date/Time   CHOL  164 05/04/2021 1325   TRIG 190 (H) 05/04/2021 1325   HDL 44 05/04/2021 1325   CHOLHDL 3.7 05/04/2021 1325    CHOLHDL 5 03/04/2015 0824   VLDL 20.8 03/04/2015 0824   LDLCALC 88 05/04/2021 1325    Physical Exam:    VS:  BP 120/76   Pulse 68   Ht 5' 2 (1.575 m)   Wt 148 lb (67.1 kg)   SpO2 97%   BMI 27.07 kg/m     Wt Readings from Last 3 Encounters:  08/08/24 148 lb (67.1 kg)  07/24/24 145 lb 3.2 oz (65.9 kg)  06/26/24 142 lb (64.4 kg)     GENERAL:  Well nourished, well developed in no acute distress NECK: No JVD; No carotid bruits CARDIAC: RRR, S1 and S2 present, no murmurs, no rubs, no gallops CHEST:  Clear to auscultation without rales, wheezing or rhonchi  Extremities: No pitting pedal edema. Pulses bilaterally symmetric with radial 2+ and dorsalis pedis 2+ NEUROLOGIC:  Alert and oriented x 3  Medication Adjustments/Labs and Tests Ordered: Current medicines are reviewed at length with Tina [name] today.  Concerns regarding medicines are outlined above.  Orders Placed This Encounter  Procedures   EKG 12-Lead   No orders of Tina defined types were placed in this encounter.   Signed, Laurey Salser reddy Nyja Westbrook, MD, MPH, [facility]. 08/08/2024 10:42 AM    [facility]    [1]  Current Meds  Medication Sig   acetaminophen  (TYLENOL ) 325 MG tablet Take 325 mg by mouth every 6 (six) hours as needed for pain or headache.   AIRSUPRA 90-80 MCG/ACT AERO Inhale 2 puffs into Tina lungs daily.   ARNUITY ELLIPTA 100 MCG/ACT AEPB Inhale 2 puffs into Tina lungs 2 (two) times daily.   Calcium  Carbonate (CALCIUM  500 PO) Take 1 tablet by mouth daily at 6 (six) AM.   clobetasol ointment (TEMOVATE) 0.05 % Apply 1 Application topically as needed.   Coenzyme Q10 (COQ-10) 200 MG CAPS Take 200 mg by mouth in Tina morning.   denosumab  (PROLIA ) 60 MG/ML SOSY injection Inject 60 mg into Tina skin every 6 (six) months.   ELIQUIS  5 MG TABS tablet TAKE 1 TABLET BY MOUTH TWICE A DAY   ezetimibe  (ZETIA ) 10 MG tablet TAKE 1/2 TABLET BY MOUTH DAILY   famotidine (PEPCID) 40 MG tablet Take 40 mg  by mouth 2 (two) times daily.   flecainide  (TAMBOCOR ) 50 MG tablet Take 1 tablet (50 mg total) by mouth 2 (two) times daily.   fluticasone  (FLONASE  ALLERGY RELIEF) 50 MCG/ACT nasal spray Place 1 spray into both nostrils daily.   levocetirizine (XYZAL) 5 MG tablet Take 5 mg by mouth every evening.   meclizine  (ANTIVERT ) 25 MG tablet Take 25 mg by mouth daily as needed for dizziness (enter ear problems).   montelukast (SINGULAIR) 10 MG tablet Take 10 mg by mouth at bedtime.   Multiple Vitamins-Minerals (CENTRUM SILVER PO) Take 1 tablet by mouth daily at 6 (six) AM.   Omega-3 Fatty Acids (MEGARED ADVANCED OMEGA-3) 800 MG CAPS Take 800 mg by mouth daily.   pravastatin  (PRAVACHOL ) 20 MG tablet TAKE 1 TABLET BY MOUTH EVERY DAY IN Tina EVENING   propranolol (INDERAL) 10 MG tablet Take 10 mg by mouth 2 (two) times daily.   raloxifene  (EVISTA ) 60 MG tablet Take 1 tablet (60 mg total) by mouth daily.   "

## 2024-08-08 NOTE — Assessment & Plan Note (Signed)
 CHADS2 VASc score 2. On anticoagulation with Eliquis  5 mg twice daily. Continue same.  On rhythm control with flecainide  50 mg twice daily and propranolol 10 mg twice daily. Now s/p A-fib ablation 06/26/2024. Currently remains on flecainide  with plans to stop it 3 months post ablation. Remains in sinus rhythm by EKG today. Good symptom control, she denies any palpitations.  Continue follow-up with A-fib clinic as scheduled in March.

## 2024-08-08 NOTE — Progress Notes (Signed)
 Remote Loop Recorder Transmission

## 2024-08-17 ENCOUNTER — Other Ambulatory Visit: Payer: Self-pay | Admitting: Oncology

## 2024-08-17 DIAGNOSIS — D0511 Intraductal carcinoma in situ of right breast: Secondary | ICD-10-CM

## 2024-09-01 ENCOUNTER — Encounter

## 2024-09-02 ENCOUNTER — Encounter

## 2024-09-04 ENCOUNTER — Inpatient Hospital Stay: Admitting: Oncology

## 2024-09-04 ENCOUNTER — Ambulatory Visit

## 2024-09-04 ENCOUNTER — Encounter

## 2024-09-25 ENCOUNTER — Ambulatory Visit (HOSPITAL_COMMUNITY): Admitting: Nurse Practitioner

## 2024-10-03 ENCOUNTER — Encounter

## 2024-10-05 ENCOUNTER — Ambulatory Visit

## 2024-10-06 ENCOUNTER — Encounter

## 2024-11-03 ENCOUNTER — Encounter

## 2024-11-05 ENCOUNTER — Ambulatory Visit

## 2024-11-06 ENCOUNTER — Encounter

## 2024-11-10 ENCOUNTER — Encounter

## 2024-12-06 ENCOUNTER — Ambulatory Visit

## 2024-12-15 ENCOUNTER — Encounter

## 2025-01-06 ENCOUNTER — Ambulatory Visit

## 2025-01-19 ENCOUNTER — Encounter

## 2025-02-06 ENCOUNTER — Ambulatory Visit

## 2025-03-09 ENCOUNTER — Ambulatory Visit

## 2025-04-09 ENCOUNTER — Ambulatory Visit

## 2025-05-10 ENCOUNTER — Ambulatory Visit

## 2025-06-10 ENCOUNTER — Ambulatory Visit

## 2025-07-11 ENCOUNTER — Ambulatory Visit

## 2025-08-11 ENCOUNTER — Ambulatory Visit
